# Patient Record
Sex: Male | Born: 1950
Health system: Southern US, Community
[De-identification: ages and names within clinical notes are randomized; demographics above are authoritative.]

## PROBLEM LIST (undated history)

## (undated) DIAGNOSIS — I10 Essential (primary) hypertension: Secondary | ICD-10-CM

## (undated) DIAGNOSIS — I2510 Atherosclerotic heart disease of native coronary artery without angina pectoris: Secondary | ICD-10-CM

## (undated) DIAGNOSIS — I729 Aneurysm of unspecified site: Secondary | ICD-10-CM

## (undated) DIAGNOSIS — N183 Chronic kidney disease, stage 3 unspecified: Secondary | ICD-10-CM

## (undated) DIAGNOSIS — E039 Hypothyroidism, unspecified: Secondary | ICD-10-CM

## (undated) DIAGNOSIS — G629 Polyneuropathy, unspecified: Secondary | ICD-10-CM

## (undated) DIAGNOSIS — Z87442 Personal history of urinary calculi: Secondary | ICD-10-CM

## (undated) DIAGNOSIS — I213 ST elevation (STEMI) myocardial infarction of unspecified site: Secondary | ICD-10-CM

## (undated) DIAGNOSIS — M1990 Unspecified osteoarthritis, unspecified site: Secondary | ICD-10-CM

## (undated) DIAGNOSIS — E785 Hyperlipidemia, unspecified: Secondary | ICD-10-CM

## (undated) HISTORY — PX: CHOLECYSTECTOMY: SHX55

## (undated) HISTORY — PX: KNEE ARTHROSCOPY: SUR90

## (undated) HISTORY — DX: Chronic kidney disease, stage 3 (moderate): N18.3

## (undated) HISTORY — PX: EXTRACORPOREAL SHOCK WAVE LITHOTRIPSY: SHX1557

## (undated) HISTORY — DX: ST elevation (STEMI) myocardial infarction of unspecified site: I21.3

## (undated) HISTORY — PX: OTHER SURGICAL HISTORY: SHX169

## (undated) HISTORY — DX: Chronic kidney disease, stage 3 unspecified: N18.30

## (undated) HISTORY — DX: Atherosclerotic heart disease of native coronary artery without angina pectoris: I25.10

## (undated) HISTORY — DX: Essential (primary) hypertension: I10

---

## 2001-02-22 ENCOUNTER — Ambulatory Visit (HOSPITAL_COMMUNITY): Admission: RE | Admit: 2001-02-22 | Discharge: 2001-02-22 | Payer: Self-pay | Admitting: Urology

## 2001-02-22 ENCOUNTER — Encounter: Payer: Self-pay | Admitting: Urology

## 2001-10-24 ENCOUNTER — Encounter: Payer: Self-pay | Admitting: Urology

## 2001-10-24 ENCOUNTER — Observation Stay (HOSPITAL_COMMUNITY): Admission: RE | Admit: 2001-10-24 | Discharge: 2001-10-25 | Payer: Self-pay | Admitting: Urology

## 2013-10-11 NOTE — H&P (Signed)
  NTS SOAP Note  Vital Signs:  Vitals as of: 5/00/9381: Systolic 829: Diastolic 81: Heart Rate 70: Temp 98.33F: Height 36ft 0in: Weight 186Lbs 0 Ounces: BMI 25.23  BMI : 25.23 kg/m2  Subjective: This 63 Years 77 Months old Male presents for screening TCS.  Had multple attempts at TCS and BE at another facility recently, but was never able to have the colon fully examined.  Now presents for me to do colonoscopy.  No family h/o colon carcinoma.  Had diverticulitis in 3/15.  Review of Symptoms:  Constitutional:  fatigue Head:unremarkable    Eyes:unremarkable   Nose/Mouth/Throat:unremarkable Cardiovascular:  unremarkable   Respiratory:unremarkable   Gastrointestinal:  unremarkable   Genitourinary:unremarkable     Musculoskeletal:unremarkable   Skin:unremarkable Hematolgic/Lymphatic:unremarkable     Allergic/Immunologic:unremarkable     Past Medical History:    Reviewed  Past Medical History  Surgical History: cholecystectomy, knee surgery, multiple attempted colonoscopy 4/15 Medical Problems: diverticulitis Allergies: codeine Medications: none   Social History:Reviewed  Social History  Preferred Language: English Race:  White Ethnicity: Not Hispanic / Latino Age: 63 Years 8 Months Marital Status:  M Alcohol: no Recreational drug(s): no   Smoking Status: Never smoker reviewed on 10/01/2013 Functional Status reviewed on 10/01/2013 ------------------------------------------------ Bathing: Normal Cooking: Normal Dressing: Normal Driving: Normal Eating: Normal Managing Meds: Normal Oral Care: Normal Shopping: Normal Toileting: Normal Transferring: Normal Walking: Normal Cognitive Status reviewed on 10/01/2013 ------------------------------------------------ Attention: Normal Decision Making: Normal Language: Normal Memory: Normal Motor: Normal Perception: Normal Problem Solving: Normal Visual and Spatial:  Normal   Family History:  Reviewed  Family Health History Mother, Living; Cancer unspecified;  Father, Living; Healthy;     Objective Information: General:  Well appearing, well nourished in no distress. Heart:  RRR, no murmur or gallop.  Normal S1, S2.  No S3, S4.  Lungs:    CTA bilaterally, no wheezes, rhonchi, rales.  Breathing unlabored. Abdomen:Soft, NT/ND, no HSM, no masses.  Assessment:Need for screening TCS  Diagnoses: V76.51 Screening for malignant neoplasm of colon (Encounter for screening for malignant neoplasm of colon)  Procedures: 93716 - OFFICE OUTPATIENT NEW 20 MINUTES    Plan:  Schedule for TCS in OR on 11/08/13.   Patient Education:Alternative treatments to surgery were discussed with patient (and family).  Risks and benefits  of procedure including bleeding and perforation were fully explained to the patient (and family) who gave informed consent. Patient/family questions were addressed.  Follow-up:Pending Surgery

## 2013-11-04 ENCOUNTER — Encounter (HOSPITAL_COMMUNITY): Payer: Self-pay | Admitting: Pharmacy Technician

## 2013-11-04 ENCOUNTER — Encounter (HOSPITAL_COMMUNITY): Payer: Self-pay

## 2013-11-04 ENCOUNTER — Encounter (HOSPITAL_COMMUNITY)
Admission: RE | Admit: 2013-11-04 | Discharge: 2013-11-04 | Disposition: A | Discharge status: Home / Self Care | Payer: BC Managed Care – PPO | Source: Ambulatory Visit | Attending: General Surgery | Admitting: General Surgery

## 2013-11-04 ENCOUNTER — Other Ambulatory Visit: Payer: Self-pay

## 2013-11-04 DIAGNOSIS — Z01812 Encounter for preprocedural laboratory examination: Secondary | ICD-10-CM | POA: Insufficient documentation

## 2013-11-04 DIAGNOSIS — Z01810 Encounter for preprocedural cardiovascular examination: Secondary | ICD-10-CM | POA: Insufficient documentation

## 2013-11-04 HISTORY — DX: Unspecified osteoarthritis, unspecified site: M19.90

## 2013-11-04 LAB — BASIC METABOLIC PANEL
BUN: 14 mg/dL (ref 6–23)
CHLORIDE: 104 meq/L (ref 96–112)
CO2: 26 meq/L (ref 19–32)
CREATININE: 1.47 mg/dL — AB (ref 0.50–1.35)
Calcium: 9.7 mg/dL (ref 8.4–10.5)
GFR calc non Af Amer: 49 mL/min — ABNORMAL LOW (ref >90)
GFR, EST AFRICAN AMERICAN: 57 mL/min — AB (ref >90)
Glucose, Bld: 96 mg/dL (ref 70–99)
POTASSIUM: 5.1 meq/L (ref 3.7–5.3)
Sodium: 143 mEq/L (ref 137–147)

## 2013-11-04 LAB — CBC WITH DIFFERENTIAL/PLATELET
Basophils Absolute: 0 10*3/uL (ref 0.0–0.1)
Basophils Relative: 0 % (ref 0–1)
EOS PCT: 0 % (ref 0–5)
Eosinophils Absolute: 0 10*3/uL (ref 0.0–0.7)
HCT: 40.7 % (ref 39.0–52.0)
HEMOGLOBIN: 13.9 g/dL (ref 13.0–17.0)
LYMPHS PCT: 25 % (ref 12–46)
Lymphs Abs: 2.5 10*3/uL (ref 0.7–4.0)
MCH: 29.6 pg (ref 26.0–34.0)
MCHC: 34.2 g/dL (ref 30.0–36.0)
MCV: 86.6 fL (ref 78.0–100.0)
MONOS PCT: 5 % (ref 3–12)
Monocytes Absolute: 0.5 10*3/uL (ref 0.1–1.0)
NEUTROS ABS: 6.8 10*3/uL (ref 1.7–7.7)
Neutrophils Relative %: 70 % (ref 43–77)
Platelets: 263 10*3/uL (ref 150–400)
RBC: 4.7 MIL/uL (ref 4.22–5.81)
RDW: 14.1 % (ref 11.5–15.5)
WBC: 9.9 10*3/uL (ref 4.0–10.5)

## 2013-11-04 NOTE — Patient Instructions (Signed)
Charles Brooks  11/04/2013   Your procedure is scheduled on:   11/08/2013  Report to [facility] at  67  AM.  Call this number if you have problems the morning of surgery: [phone]   Remember:   Do not eat food or drink liquids after midnight.   Take these medicines the morning of surgery with A SIP OF WATER: none   Do not wear jewelry, make-up or nail polish.  Do not wear lotions, powders, or perfumes.   Do not shave 48 hours prior to surgery. Men may shave face and neck.  Do not bring valuables to the hospital.  [facility] is not responsible for any belongings or valuables.               Contacts, dentures or bridgework may not be worn into surgery.  Leave suitcase in the car. After surgery it may be brought to your room.  For patients admitted to the hospital, discharge time is determined by your treatment team.               Patients discharged the day of surgery will not be allowed to drive home.  Name and phone number of your driver: family  Special Instructions: N/A   Please read over the following fact sheets that you were given: Pain Booklet, Coughing and Deep Breathing, Surgical Site Infection Prevention, Anesthesia Post-op Instructions and Care and Recovery After Surgery Colonoscopy A colonoscopy is an exam to look at the entire large intestine (colon). This exam can help find problems such as tumors, polyps, inflammation, and areas of bleeding. The exam takes about 1 hour.  LET [facility] CARE PROVIDER KNOW ABOUT:   Any allergies you have.  All medicines you are taking, including vitamins, herbs, eye drops, creams, and over-the-counter medicines.  Previous problems you or members of your family have had with the use of anesthetics.  Any blood disorders you have.  Previous surgeries you have had.  Medical conditions you have. RISKS AND COMPLICATIONS  Generally, this is a safe procedure. However, as with any procedure, complications can occur. Possible  complications include:  Bleeding.  Tearing or rupture of the colon wall.  Reaction to medicines given during the exam.  Infection (rare). BEFORE THE PROCEDURE   Ask your health care provider about changing or stopping your regular medicines.  You may be prescribed an oral bowel prep. This involves drinking a large amount of medicated liquid, starting the day before your procedure. The liquid will cause you to have multiple loose stools until your stool is almost clear or light green. This cleans out your colon in preparation for the procedure.  Do not eat or drink anything else once you have started the bowel prep, unless your health care provider tells you it is safe to do so.  Arrange for someone to drive you home after the procedure. PROCEDURE   You will be given medicine to help you relax (sedative).  You will lie on your side with your knees bent.  A long, flexible tube with a light and camera on the end (colonoscope) will be inserted through the rectum and into the colon. The camera sends video back to a computer screen as it moves through the colon. The colonoscope also releases carbon dioxide gas to inflate the colon. This helps your health care provider see the area better.  During the exam, your health care provider may take a small tissue sample (biopsy) to be  examined under a microscope if any abnormalities are found.  The exam is finished when the entire colon has been viewed. AFTER THE PROCEDURE   Do not drive for 24 hours after the exam.  You may have a small amount of blood in your stool.  You may pass moderate amounts of gas and have mild abdominal cramping or bloating. This is caused by the gas used to inflate your colon during the exam.  Ask when your test results will be ready and how you will get your results. Make sure you get your test results. Document Released: 06/03/2000 Document Revised: 03/27/2013 Document Reviewed: 02/11/2013 [facility] Patient  Information 2014 [place]. PATIENT INSTRUCTIONS POST-ANESTHESIA  IMMEDIATELY FOLLOWING SURGERY:  Do not drive or operate machinery for the first twenty four hours after surgery.  Do not make any important decisions for twenty four hours after surgery or while taking narcotic pain medications or sedatives.  If you develop intractable nausea and vomiting or a severe headache please notify your doctor immediately.  FOLLOW-UP:  Please make an appointment with your surgeon as instructed. You do not need to follow up with anesthesia unless specifically instructed to do so.  WOUND CARE INSTRUCTIONS (if applicable):  Keep a dry clean dressing on the anesthesia/puncture wound site if there is drainage.  Once the wound has quit draining you may leave it open to air.  Generally you should leave the bandage intact for twenty four hours unless there is drainage.  If the epidural site drains for more than 36-48 hours please call the anesthesia department.  QUESTIONS?:  Please feel free to call your physician or the hospital operator if you have any questions, and they will be happy to assist you.

## 2013-11-08 ENCOUNTER — Encounter (HOSPITAL_COMMUNITY)
Admission: RE | Disposition: A | Discharge status: Home / Self Care | Payer: Self-pay | Source: Ambulatory Visit | Attending: General Surgery

## 2013-11-08 ENCOUNTER — Encounter (HOSPITAL_COMMUNITY): Payer: Self-pay | Admitting: *Deleted

## 2013-11-08 ENCOUNTER — Encounter (HOSPITAL_COMMUNITY): Payer: BC Managed Care – PPO | Admitting: Anesthesiology

## 2013-11-08 ENCOUNTER — Ambulatory Visit (HOSPITAL_COMMUNITY)
Admission: RE | Admit: 2013-11-08 | Discharge: 2013-11-08 | Disposition: A | Discharge status: Home / Self Care | Payer: BC Managed Care – PPO | Source: Ambulatory Visit | Attending: General Surgery | Admitting: General Surgery

## 2013-11-08 ENCOUNTER — Ambulatory Visit (HOSPITAL_COMMUNITY): Payer: BC Managed Care – PPO | Admitting: Anesthesiology

## 2013-11-08 DIAGNOSIS — K5730 Diverticulosis of large intestine without perforation or abscess without bleeding: Secondary | ICD-10-CM | POA: Insufficient documentation

## 2013-11-08 DIAGNOSIS — Z87891 Personal history of nicotine dependence: Secondary | ICD-10-CM | POA: Insufficient documentation

## 2013-11-08 DIAGNOSIS — D126 Benign neoplasm of colon, unspecified: Secondary | ICD-10-CM | POA: Insufficient documentation

## 2013-11-08 DIAGNOSIS — Z1211 Encounter for screening for malignant neoplasm of colon: Secondary | ICD-10-CM | POA: Insufficient documentation

## 2013-11-08 HISTORY — PX: COLONOSCOPY WITH PROPOFOL: SHX5780

## 2013-11-08 HISTORY — PX: POLYPECTOMY: SHX5525

## 2013-11-08 SURGERY — COLONOSCOPY WITH PROPOFOL
Anesthesia: Monitor Anesthesia Care | Site: Rectum

## 2013-11-08 MED ORDER — PROPOFOL 10 MG/ML IV BOLUS
INTRAVENOUS | Status: AC
  Filled 2013-11-08: qty 20

## 2013-11-08 MED ORDER — FENTANYL CITRATE 0.05 MG/ML IJ SOLN
INTRAMUSCULAR | Status: AC
  Filled 2013-11-08: qty 2

## 2013-11-08 MED ORDER — LIDOCAINE HCL (CARDIAC) 10 MG/ML IV SOLN
INTRAVENOUS | Status: DC | PRN
  Administered 2013-11-08: 50 mg via INTRAVENOUS

## 2013-11-08 MED ORDER — FENTANYL CITRATE 0.05 MG/ML IJ SOLN: 25.0000 ug | INTRAMUSCULAR | Status: DC | PRN

## 2013-11-08 MED ORDER — LACTATED RINGERS IV SOLN
INTRAVENOUS | Status: DC
  Administered 2013-11-08: 07:00:00 via INTRAVENOUS

## 2013-11-08 MED ORDER — MIDAZOLAM HCL 5 MG/5ML IJ SOLN
INTRAMUSCULAR | Status: DC | PRN
  Administered 2013-11-08 (×2): 1 mg via INTRAVENOUS

## 2013-11-08 MED ORDER — LACTATED RINGERS IV SOLN
INTRAVENOUS | Status: DC | PRN
  Administered 2013-11-08: 07:00:00 via INTRAVENOUS

## 2013-11-08 MED ORDER — SUCCINYLCHOLINE CHLORIDE 20 MG/ML IJ SOLN
INTRAMUSCULAR | Status: AC
  Filled 2013-11-08: qty 1

## 2013-11-08 MED ORDER — LIDOCAINE HCL (PF) 1 % IJ SOLN
INTRAMUSCULAR | Status: AC
  Filled 2013-11-08: qty 5

## 2013-11-08 MED ORDER — FENTANYL CITRATE 0.05 MG/ML IJ SOLN
INTRAMUSCULAR | Status: DC | PRN
  Administered 2013-11-08 (×2): 25 ug via INTRAVENOUS
  Administered 2013-11-08: 50 ug via INTRAVENOUS

## 2013-11-08 MED ORDER — MIDAZOLAM HCL 2 MG/2ML IJ SOLN
1.0000 mg | INTRAMUSCULAR | Status: DC | PRN
  Administered 2013-11-08: 2 mg via INTRAVENOUS
  Filled 2013-11-08: qty 2

## 2013-11-08 MED ORDER — STERILE WATER FOR IRRIGATION IR SOLN
Status: DC | PRN
  Administered 2013-11-08: 07:00:00

## 2013-11-08 MED ORDER — MIDAZOLAM HCL 2 MG/2ML IJ SOLN
INTRAMUSCULAR | Status: AC
  Filled 2013-11-08: qty 2

## 2013-11-08 MED ORDER — PROPOFOL INFUSION 10 MG/ML OPTIME
INTRAVENOUS | Status: DC | PRN
  Administered 2013-11-08: 75 ug/kg/min via INTRAVENOUS

## 2013-11-08 MED ORDER — ONDANSETRON HCL 4 MG/2ML IJ SOLN: 4.0000 mg | Freq: Once | INTRAMUSCULAR | Status: DC | PRN

## 2013-11-08 MED ORDER — GLYCOPYRROLATE 0.2 MG/ML IJ SOLN
0.2000 mg | Freq: Once | INTRAMUSCULAR | Status: AC
  Administered 2013-11-08: 0.2 mg via INTRAVENOUS
  Filled 2013-11-08: qty 1

## 2013-11-08 MED ORDER — FENTANYL CITRATE 0.05 MG/ML IJ SOLN
25.0000 ug | INTRAMUSCULAR | Status: AC
  Administered 2013-11-08: 25 ug via INTRAVENOUS
  Filled 2013-11-08: qty 2

## 2013-11-08 MED ORDER — ONDANSETRON HCL 4 MG/2ML IJ SOLN
4.0000 mg | Freq: Once | INTRAMUSCULAR | Status: AC
  Administered 2013-11-08: 4 mg via INTRAVENOUS
  Filled 2013-11-08: qty 2

## 2013-11-08 SURGICAL SUPPLY — 13 items
ELECT REM PT RETURN 9FT ADLT (ELECTROSURGICAL) ×3
ELECTRODE REM PT RTRN 9FT ADLT (ELECTROSURGICAL) IMPLANT
FLOOR PAD 36X40 (MISCELLANEOUS) ×3
FORMALIN 10 PREFIL 20ML (MISCELLANEOUS) ×2 IMPLANT
KIT CLEAN ENDO COMPLIANCE (KITS) ×2 IMPLANT
LUBRICANT JELLY 4.5OZ STERILE (MISCELLANEOUS) ×2 IMPLANT
MANIFOLD NEPTUNE II (INSTRUMENTS) ×2 IMPLANT
PAD FLOOR 36X40 (MISCELLANEOUS) IMPLANT
SNARE SHORT THROW 13M SML OVAL (MISCELLANEOUS) ×2 IMPLANT
TRAP SPECIMEN MUCOUS 40CC (MISCELLANEOUS) ×2 IMPLANT
TUBING ENDO SMARTCAP PENTAX (MISCELLANEOUS) ×2 IMPLANT
TUBING IRRIGATION ENDOGATOR (MISCELLANEOUS) ×2 IMPLANT
WATER STERILE IRR 1000ML POUR (IV SOLUTION) ×2 IMPLANT

## 2013-11-08 NOTE — Discharge Instructions (Signed)
Colonoscopy, Care After °Refer to this sheet in the next few weeks. These instructions provide you with information on caring for yourself after your procedure. Your health care provider may also give you more specific instructions. Your treatment has been planned according to current medical practices, but problems sometimes occur. Call your health care provider if you have any problems or questions after your procedure. °WHAT TO EXPECT AFTER THE PROCEDURE  °After your procedure, it is typical to have the following: °· A small amount of blood in your stool. °· Moderate amounts of gas and mild abdominal cramping or bloating. ° ° °HOME CARE INSTRUCTIONS °· Do not drive, operate machinery, or sign important documents for 24 hours. °· You may shower and resume your regular physical activities, but move at a slower pace for the first 24 hours. °· Take frequent rest periods for the first 24 hours. °· Walk around or put a warm pack on your abdomen to help reduce abdominal cramping and bloating. °· Drink enough fluids to keep your urine clear or pale yellow. °· You may resume your normal diet as instructed by your health care provider. Avoid heavy or fried foods that are hard to digest. °· Avoid drinking alcohol for 24 hours or as instructed by your health care provider. °· Only take over-the-counter or prescription medicines as directed by your health care provider. °· If a tissue sample (biopsy) was taken during your procedure: °¨ Do not take aspirin or blood thinners for 7 days, or as instructed by your health care provider. °¨ Do not drink alcohol for 7 days, or as instructed by your health care provider. °¨ Eat soft foods for the first 24 hours. ° ° °SEEK MEDICAL CARE IF: °You have persistent spotting of blood in your stool 2-3 days after the procedure. ° ° °SEEK IMMEDIATE MEDICAL CARE IF: °· You have more than a small spotting of blood in your stool. °· You pass large blood clots in your stool. °· Your abdomen is  swollen (distended). °· You have nausea or vomiting. °· You have a fever. °· You have increasing abdominal pain that is not relieved with medicine. ° ° °

## 2013-11-08 NOTE — Anesthesia Preprocedure Evaluation (Addendum)
Anesthesia Evaluation  Patient identified by MRN, date of birth, ID band Patient awake    Reviewed: Allergy & Precautions, H&P , NPO status , Patient's Chart, lab work & pertinent test results  Airway Mallampati: III TM Distance: >3 FB   Mouth opening: Limited Mouth Opening  Dental  (+) Teeth Intact   Pulmonary neg pulmonary ROS, former smoker,  breath sounds clear to auscultation        Cardiovascular negative cardio ROS  Rhythm:Regular Rate:Normal     Neuro/Psych    GI/Hepatic negative GI ROS,   Endo/Other    Renal/GU      Musculoskeletal   Abdominal   Peds  Hematology   Anesthesia Other Findings   Reproductive/Obstetrics                          Anesthesia Physical Anesthesia Plan  ASA: I  Anesthesia Plan: MAC   Post-op Pain Management:    Induction: Intravenous  Airway Management Planned: Simple Face Mask  Additional Equipment:   Intra-op Plan:   Post-operative Plan:   Informed Consent: I have reviewed the patients History and Physical, chart, labs and discussed the procedure including the risks, benefits and alternatives for the proposed anesthesia with the patient or authorized representative who has indicated his/her understanding and acceptance.     Plan Discussed with:   Anesthesia Plan Comments:         Anesthesia Quick Evaluation

## 2013-11-08 NOTE — Transfer of Care (Signed)
Immediate Anesthesia Transfer of Care Note  Patient: Charles Brooks  Procedure(s) Performed: Procedure(s) with comments: COLONOSCOPY WITH PROPOFOL (N/A) - in cecum at 0741; cecal withdrawal time = 10 min POLYPECTOMY (N/A) - cecal polyp  Patient Location: PACU  Anesthesia Type:MAC  Level of Consciousness: sedated and patient cooperative  Airway & Oxygen Therapy: Patient Spontanous Breathing and Patient connected to nasal cannula oxygen  Post-op Assessment: Report given to PACU RN and Post -op Vital signs reviewed and stable  Post vital signs: Reviewed and stable  Complications: No apparent anesthesia complications

## 2013-11-08 NOTE — Anesthesia Procedure Notes (Signed)
Procedure Name: MAC Date/Time: 11/08/2013 7:28 AM Performed by: Andree Elk, Dewanna Hurston A Pre-anesthesia Checklist: Patient identified, Timeout performed, Emergency Drugs available, Suction available and Patient being monitored Patient Re-evaluated:Patient Re-evaluated prior to inductionOxygen Delivery Method: Simple face mask

## 2013-11-08 NOTE — Anesthesia Postprocedure Evaluation (Signed)
  Anesthesia Post-op Note  Patient: Charles Brooks  Procedure(s) Performed: Procedure(s) with comments: COLONOSCOPY WITH PROPOFOL (N/A) - in cecum at 0741; cecal withdrawal time = 10 min POLYPECTOMY (N/A) - cecal polyp  Patient Location: PACU  Anesthesia Type:MAC  Level of Consciousness: sedated and patient cooperative  Airway and Oxygen Therapy: Patient Spontanous Breathing and Patient connected to nasal cannula oxygen  Post-op Pain: none  Post-op Assessment: Post-op Vital signs reviewed, Patient's Cardiovascular Status Stable, Respiratory Function Stable, Patent Airway and No signs of Nausea or vomiting  Post-op Vital Signs: Reviewed and stable  Last Vitals:  Filed Vitals:   11/08/13 0802  BP: 87/51  Pulse: 82  Temp: 36.8 C  Resp: 14    Complications: No apparent anesthesia complications

## 2013-11-08 NOTE — Interval H&P Note (Signed)
History and Physical Interval Note:  11/08/2013 7:24 AM  Charles Brooks  has presented today for surgery, with the diagnosis of screening  The various methods of treatment have been discussed with the patient and family. After consideration of risks, benefits and other options for treatment, the patient has consented to  Procedure(s): COLONOSCOPY WITH PROPOFOL (N/A) as a surgical intervention .  The patient's history has been reviewed, patient examined, no change in status, stable for surgery.  I have reviewed the patient's chart and labs.  Questions were answered to the patient's satisfaction.     [name]

## 2013-11-08 NOTE — Op Note (Signed)
[facility] [address] [place], [address]   COLONOSCOPY PROCEDURE REPORT  PATIENT: Charles Brooks, Charles Brooks  MR#: [id number] BIRTHDATE: 08/03/1950 , 85  yrs. old GENDER: Male ENDOSCOPIST: Aviva Signs, MD REFERRED XB:JYNWGN, David PROCEDURE DATE:  11/08/2013 PROCEDURE:   Colonoscopy, screening ASA CLASS:   Class II INDICATIONS:Average risk patient for colon cancer. MEDICATIONS: Propofol (Diprivan)  DESCRIPTION OF PROCEDURE:   After the risks benefits and alternatives of the procedure were thoroughly explained, informed consent was obtained.  A digital rectal exam revealed no abnormalities of the rectum.   The     endoscope was introduced through the anus and advanced to the cecum, which was identified by both the appendix and ileocecal valve. No adverse events experienced.   The quality of the prep was adequate, using Trilyte The instrument was then slowly withdrawn as the colon was fully examined.      COLON FINDINGS: Mild diverticulosis was noted in the sigmoid colon. A small smooth sessile polyp was found at the cecum.  A polypectomy was performed using snare cautery.  The resection was complete and the polyp tissue was completely retrieved. Retroflexed views revealed no abnormalities. The time to cecum=4 minutes 0 seconds.  Withdrawal time=10 minutes 0 seconds.  The scope was withdrawn and the procedure completed. COMPLICATIONS: There were no complications.  ENDOSCOPIC IMPRESSION: 1.   Mild diverticulosis was noted in the sigmoid colon 2.   Small sessile polyp was found at the cecum; polypectomy was performed using snare cautery  RECOMMENDATIONS: 1.  Await pathology results 2.  Repeat Colonscopy in 3-5 years.   eSigned:  Aviva Signs, MD 11/08/2013 8:21 AM   cc:

## 2013-11-13 ENCOUNTER — Encounter (HOSPITAL_COMMUNITY): Payer: Self-pay | Admitting: General Surgery

## 2014-08-14 ENCOUNTER — Inpatient Hospital Stay (HOSPITAL_COMMUNITY)
Admission: EM | Admit: 2014-08-14 | Discharge: 2014-08-16 | DRG: 247 | Disposition: A | Discharge status: Home / Self Care | Payer: BLUE CROSS/BLUE SHIELD | Attending: Cardiovascular Disease | Admitting: Cardiovascular Disease

## 2014-08-14 ENCOUNTER — Encounter (HOSPITAL_COMMUNITY)
Admission: EM | Disposition: A | Discharge status: Home / Self Care | Payer: Self-pay | Source: Home / Self Care | Attending: Cardiovascular Disease

## 2014-08-14 ENCOUNTER — Encounter (HOSPITAL_COMMUNITY): Payer: Self-pay

## 2014-08-14 DIAGNOSIS — Z87891 Personal history of nicotine dependence: Secondary | ICD-10-CM | POA: Diagnosis not present

## 2014-08-14 DIAGNOSIS — E785 Hyperlipidemia, unspecified: Secondary | ICD-10-CM | POA: Diagnosis present

## 2014-08-14 DIAGNOSIS — E781 Pure hyperglyceridemia: Secondary | ICD-10-CM | POA: Diagnosis present

## 2014-08-14 DIAGNOSIS — I2510 Atherosclerotic heart disease of native coronary artery without angina pectoris: Secondary | ICD-10-CM | POA: Diagnosis present

## 2014-08-14 DIAGNOSIS — I214 Non-ST elevation (NSTEMI) myocardial infarction: Secondary | ICD-10-CM | POA: Diagnosis present

## 2014-08-14 DIAGNOSIS — I213 ST elevation (STEMI) myocardial infarction of unspecified site: Secondary | ICD-10-CM | POA: Diagnosis present

## 2014-08-14 DIAGNOSIS — I2111 ST elevation (STEMI) myocardial infarction involving right coronary artery: Secondary | ICD-10-CM | POA: Diagnosis not present

## 2014-08-14 DIAGNOSIS — R0989 Other specified symptoms and signs involving the circulatory and respiratory systems: Secondary | ICD-10-CM | POA: Diagnosis not present

## 2014-08-14 DIAGNOSIS — R079 Chest pain, unspecified: Secondary | ICD-10-CM

## 2014-08-14 DIAGNOSIS — I219 Acute myocardial infarction, unspecified: Secondary | ICD-10-CM | POA: Insufficient documentation

## 2014-08-14 HISTORY — PX: LEFT HEART CATHETERIZATION WITH CORONARY ANGIOGRAM: SHX5451

## 2014-08-14 LAB — TSH: TSH: 3.423 u[IU]/mL (ref 0.350–4.500)

## 2014-08-14 LAB — TROPONIN I
Troponin I: 0.98 ng/mL (ref <0.031)
Troponin I: 3.8 ng/mL (ref <0.031)
Troponin I: 3.82 ng/mL (ref <0.031)

## 2014-08-14 LAB — BASIC METABOLIC PANEL
Anion gap: 6 (ref 5–15)
Anion gap: 6 (ref 5–15)
BUN: 26 mg/dL — ABNORMAL HIGH (ref 6–23)
BUN: 22 mg/dL (ref 6–23)
CALCIUM: 8.6 mg/dL (ref 8.4–10.5)
CHLORIDE: 109 mmol/L (ref 96–112)
CO2: 22 mmol/L (ref 19–32)
CO2: 20 mmol/L (ref 19–32)
CREATININE: 1.61 mg/dL — AB (ref 0.50–1.35)
Calcium: 9.1 mg/dL (ref 8.4–10.5)
Chloride: 107 mmol/L (ref 96–112)
Creatinine, Ser: 1.91 mg/dL — ABNORMAL HIGH (ref 0.50–1.35)
GFR calc Af Amer: 41 mL/min — ABNORMAL LOW (ref >90)
GFR, EST AFRICAN AMERICAN: 51 mL/min — AB (ref >90)
GFR, EST NON AFRICAN AMERICAN: 35 mL/min — AB (ref >90)
GFR, EST NON AFRICAN AMERICAN: 44 mL/min — AB (ref >90)
GLUCOSE: 109 mg/dL — AB (ref 70–99)
Glucose, Bld: 144 mg/dL — ABNORMAL HIGH (ref 70–99)
Potassium: 3.8 mmol/L (ref 3.5–5.1)
Potassium: 4.8 mmol/L (ref 3.5–5.1)
Sodium: 137 mmol/L (ref 135–145)
Sodium: 133 mmol/L — ABNORMAL LOW (ref 135–145)

## 2014-08-14 LAB — LIPID PANEL
CHOL/HDL RATIO: 7.3 ratio
Cholesterol: 176 mg/dL (ref 0–200)
HDL: 24 mg/dL — AB (ref >39)
LDL Cholesterol: 90 mg/dL (ref 0–99)
Triglycerides: 311 mg/dL — ABNORMAL HIGH (ref <150)
VLDL: 62 mg/dL — AB (ref 0–40)

## 2014-08-14 LAB — CBC WITH DIFFERENTIAL/PLATELET
BASOS PCT: 1 % (ref 0–1)
Basophils Absolute: 0.1 10*3/uL (ref 0.0–0.1)
EOS PCT: 2 % (ref 0–5)
Eosinophils Absolute: 0.2 10*3/uL (ref 0.0–0.7)
HCT: 45.4 % (ref 39.0–52.0)
Hemoglobin: 16 g/dL (ref 13.0–17.0)
LYMPHS ABS: 5.2 10*3/uL — AB (ref 0.7–4.0)
Lymphocytes Relative: 47 % — ABNORMAL HIGH (ref 12–46)
MCH: 31.6 pg (ref 26.0–34.0)
MCHC: 35.2 g/dL (ref 30.0–36.0)
MCV: 89.5 fL (ref 78.0–100.0)
MONOS PCT: 8 % (ref 3–12)
Monocytes Absolute: 0.8 10*3/uL (ref 0.1–1.0)
NEUTROS PCT: 42 % — AB (ref 43–77)
Neutro Abs: 4.6 10*3/uL (ref 1.7–7.7)
PLATELETS: 225 10*3/uL (ref 150–400)
RBC: 5.07 MIL/uL (ref 4.22–5.81)
RDW: 13.2 % (ref 11.5–15.5)
WBC: 10.8 10*3/uL — AB (ref 4.0–10.5)

## 2014-08-14 LAB — BRAIN NATRIURETIC PEPTIDE: B Natriuretic Peptide: 30.2 pg/mL (ref 0.0–100.0)

## 2014-08-14 LAB — MRSA PCR SCREENING: MRSA by PCR: NEGATIVE

## 2014-08-14 LAB — PROTIME-INR
INR: 5.65 — AB (ref 0.00–1.49)
PROTHROMBIN TIME: 51.5 s — AB (ref 11.6–15.2)

## 2014-08-14 LAB — APTT: aPTT: 143 seconds — ABNORMAL HIGH (ref 24–37)

## 2014-08-14 LAB — POCT ACTIVATED CLOTTING TIME: Activated Clotting Time: 908 seconds

## 2014-08-14 SURGERY — LEFT HEART CATHETERIZATION WITH CORONARY ANGIOGRAM
Anesthesia: LOCAL

## 2014-08-14 MED ORDER — ASPIRIN 81 MG PO CHEW: 324.0000 mg | CHEWABLE_TABLET | ORAL | Status: DC

## 2014-08-14 MED ORDER — HEPARIN SODIUM (PORCINE) 5000 UNIT/ML IJ SOLN
INTRAMUSCULAR | Status: DC
Start: 2014-08-14 — End: 2014-08-14
  Filled 2014-08-14: qty 1

## 2014-08-14 MED ORDER — TICAGRELOR 90 MG PO TABS: 90.0000 mg | ORAL_TABLET | Freq: Two times a day (BID) | ORAL | Status: DC

## 2014-08-14 MED ORDER — NITROGLYCERIN IN D5W 200-5 MCG/ML-% IV SOLN: 0.0000 ug/min | INTRAVENOUS | Status: DC

## 2014-08-14 MED ORDER — ASPIRIN 81 MG PO CHEW
324.0000 mg | CHEWABLE_TABLET | Freq: Once | ORAL | Status: AC
  Administered 2014-08-14: 324 mg via ORAL

## 2014-08-14 MED ORDER — ASPIRIN EC 81 MG PO TBEC: 81.0000 mg | DELAYED_RELEASE_TABLET | Freq: Every day | ORAL | Status: DC

## 2014-08-14 MED ORDER — METOPROLOL TARTRATE 12.5 MG HALF TABLET
12.5000 mg | ORAL_TABLET | Freq: Two times a day (BID) | ORAL | Status: DC
  Filled 2014-08-14 (×2): qty 1

## 2014-08-14 MED ORDER — NITROGLYCERIN 0.4 MG SL SUBL
0.4000 mg | SUBLINGUAL_TABLET | SUBLINGUAL | Status: DC | PRN
  Administered 2014-08-14: 0.4 mg via SUBLINGUAL

## 2014-08-14 MED ORDER — SODIUM CHLORIDE 0.9 % IV SOLN: INTRAVENOUS | Status: DC

## 2014-08-14 MED ORDER — NITROGLYCERIN 0.4 MG SL SUBL: 0.4000 mg | SUBLINGUAL_TABLET | SUBLINGUAL | Status: DC | PRN

## 2014-08-14 MED ORDER — ATROPINE SULFATE 0.1 MG/ML IJ SOLN
INTRAMUSCULAR | Status: AC
  Filled 2014-08-14: qty 10

## 2014-08-14 MED ORDER — ATORVASTATIN CALCIUM 80 MG PO TABS
80.0000 mg | ORAL_TABLET | Freq: Every day | ORAL | Status: DC
  Administered 2014-08-14 – 2014-08-15 (×2): 80 mg via ORAL
  Filled 2014-08-14 (×4): qty 1

## 2014-08-14 MED ORDER — MORPHINE SULFATE 4 MG/ML IJ SOLN
4.0000 mg | Freq: Once | INTRAMUSCULAR | Status: AC
  Administered 2014-08-14: 4 mg via INTRAVENOUS
  Filled 2014-08-14: qty 1

## 2014-08-14 MED ORDER — MORPHINE SULFATE 4 MG/ML IJ SOLN
INTRAMUSCULAR | Status: AC
  Filled 2014-08-14: qty 1

## 2014-08-14 MED ORDER — METOPROLOL TARTRATE 12.5 MG HALF TABLET
12.5000 mg | ORAL_TABLET | Freq: Two times a day (BID) | ORAL | Status: DC
  Administered 2014-08-14 – 2014-08-16 (×3): 12.5 mg via ORAL
  Filled 2014-08-14 (×7): qty 1

## 2014-08-14 MED ORDER — NITROGLYCERIN 1 MG/10 ML FOR IR/CATH LAB
INTRA_ARTERIAL | Status: AC
  Filled 2014-08-14: qty 10

## 2014-08-14 MED ORDER — BIVALIRUDIN 250 MG IV SOLR
INTRAVENOUS | Status: AC
  Filled 2014-08-14: qty 250

## 2014-08-14 MED ORDER — SODIUM CHLORIDE 0.9 % IV SOLN: 0.2500 mg/kg/h | INTRAVENOUS | Status: DC

## 2014-08-14 MED ORDER — HEPARIN (PORCINE) IN NACL 2-0.9 UNIT/ML-% IJ SOLN
INTRAMUSCULAR | Status: AC
  Filled 2014-08-14: qty 1000

## 2014-08-14 MED ORDER — SODIUM CHLORIDE 0.9 % IV SOLN
INTRAVENOUS | Status: AC
  Administered 2014-08-14: 12:00:00 via INTRAVENOUS

## 2014-08-14 MED ORDER — ATORVASTATIN CALCIUM 80 MG PO TABS
80.0000 mg | ORAL_TABLET | Freq: Every day | ORAL | Status: DC
  Filled 2014-08-14: qty 1

## 2014-08-14 MED ORDER — MIDAZOLAM HCL 2 MG/2ML IJ SOLN
INTRAMUSCULAR | Status: AC
  Filled 2014-08-14: qty 2

## 2014-08-14 MED ORDER — TICAGRELOR 90 MG PO TABS
ORAL_TABLET | ORAL | Status: AC
  Filled 2014-08-14: qty 2

## 2014-08-14 MED ORDER — MORPHINE SULFATE 4 MG/ML IJ SOLN
4.0000 mg | Freq: Once | INTRAMUSCULAR | Status: AC
  Administered 2014-08-14: 4 mg via INTRAVENOUS

## 2014-08-14 MED ORDER — ONDANSETRON HCL 4 MG/2ML IJ SOLN: 4.0000 mg | Freq: Four times a day (QID) | INTRAMUSCULAR | Status: DC | PRN

## 2014-08-14 MED ORDER — HEPARIN SODIUM (PORCINE) 5000 UNIT/ML IJ SOLN
60.0000 [IU]/kg | Freq: Once | INTRAMUSCULAR | Status: AC
  Administered 2014-08-14: 5100 [IU] via INTRAVENOUS
  Filled 2014-08-14: qty 2

## 2014-08-14 MED ORDER — ASPIRIN 81 MG PO CHEW
CHEWABLE_TABLET | ORAL | Status: AC
  Filled 2014-08-14: qty 4

## 2014-08-14 MED ORDER — NITROGLYCERIN 0.4 MG SL SUBL
0.4000 mg | SUBLINGUAL_TABLET | Freq: Once | SUBLINGUAL | Status: AC
  Administered 2014-08-14: 0.4 mg via SUBLINGUAL

## 2014-08-14 MED ORDER — ACETAMINOPHEN 325 MG PO TABS: 650.0000 mg | ORAL_TABLET | ORAL | Status: DC | PRN

## 2014-08-14 MED ORDER — TICAGRELOR 90 MG PO TABS
90.0000 mg | ORAL_TABLET | Freq: Two times a day (BID) | ORAL | Status: DC
  Filled 2014-08-14 (×2): qty 1

## 2014-08-14 MED ORDER — METOPROLOL TARTRATE 12.5 MG HALF TABLET
12.5000 mg | ORAL_TABLET | Freq: Two times a day (BID) | ORAL | Status: DC
  Filled 2014-08-14: qty 1

## 2014-08-14 MED ORDER — NITROGLYCERIN 0.4 MG SL SUBL
SUBLINGUAL_TABLET | SUBLINGUAL | Status: AC
  Filled 2014-08-14: qty 3

## 2014-08-14 MED ORDER — ASPIRIN EC 81 MG PO TBEC
81.0000 mg | DELAYED_RELEASE_TABLET | Freq: Every day | ORAL | Status: DC
  Administered 2014-08-15 – 2014-08-16 (×2): 81 mg via ORAL
  Filled 2014-08-14 (×3): qty 1

## 2014-08-14 MED ORDER — FENTANYL CITRATE 0.05 MG/ML IJ SOLN
INTRAMUSCULAR | Status: AC
  Filled 2014-08-14: qty 2

## 2014-08-14 MED ORDER — SODIUM CHLORIDE 0.9 % IR SOLN: Freq: Once | Status: DC

## 2014-08-14 MED ORDER — HEPARIN SODIUM (PORCINE) 5000 UNIT/ML IJ SOLN
5000.0000 [IU] | Freq: Three times a day (TID) | INTRAMUSCULAR | Status: DC
  Administered 2014-08-14 – 2014-08-15 (×4): 5000 [IU] via SUBCUTANEOUS
  Filled 2014-08-14 (×8): qty 1

## 2014-08-14 MED ORDER — NITROGLYCERIN IN D5W 200-5 MCG/ML-% IV SOLN
10.0000 ug/min | INTRAVENOUS | Status: DC
  Administered 2014-08-14: 10 ug/min via INTRAVENOUS
  Filled 2014-08-14: qty 250

## 2014-08-14 MED ORDER — ASPIRIN 300 MG RE SUPP: 300.0000 mg | RECTAL | Status: DC

## 2014-08-14 NOTE — Care Management Note (Signed)
    Page 1 of 1   08/14/2014     3:29:36 PM CARE MANAGEMENT NOTE 08/14/2014  Patient:  Charles Brooks, Charles Brooks   Account Number:  [id number]  Date Initiated:  08/14/2014  Documentation initiated by:  [facility]  Subjective/Objective Assessment:   Admitted with STEMI - emergent cath with placement of DES. placed on brilinta     Action/Plan:   Anticipated DC Date:  08/16/2014   Anticipated DC Plan:  [place]  CM consult      Choice offered to / List presented to:             Status of service:  In process, will continue to follow Medicare Important Message given?  NO (If response is "NO", the following Medicare IM given date fields will be blank) Date Medicare IM given:   Medicare IM given by:   Date Additional Medicare IM given:   Additional Medicare IM given by:    Discharge Disposition:    Per UR Regulation:  Reviewed for med. necessity/level of care/duration of stay  If discussed at [place] of Stay Meetings, dates discussed:    Comments:  ContactDaelyn, Brooks [phone]  [phone]   08-14-14 3:30pm Charles Brooks, Charles Brooks [phone] Patient lives at home with wife - independent prior.  Given Bilinta booklet with 30 day free card and discounted copays.  Encouraged to call the number to enroll in program for continued assistance and information. Answered questions, no further questions.

## 2014-08-14 NOTE — H&P (Signed)
Charles Brooks is an 64 y.o. male.    Chief Complaint: chest pain Primary Cardiologist: new - Dr. Shelva Majestic HPI: Charles Brooks is a 64 yo man with PMH of tobacco use quit 10 years ago who developed chest pain when driving to work this evening lasting 40+ minutes in duration. He characterized the pain as severe, substernal to left-sided, heavy sensation with radiation to the left arm. He had some associated diaphoresis. No known family history of sudden death or premature CAD. ECG at [facility] concerning with ST elevation anteriorly and lateral ST depression leading to Code STEMI activation with transfer to [facility]. He received large aspirin and heparin bolus and NTG gtt.    Past Medical History  Diagnosis Date  . Arthritis     Past Surgical History  Procedure Laterality Date  . Cholecystectomy    . Removal of kidney stones      open  . Knee arthroscopy Right   . Extracorporeal shock wave lithotripsy    . Colonoscopy with propofol N/A 11/08/2013    Procedure: COLONOSCOPY WITH PROPOFOL;  Surgeon: [name], MD;  Location: [facility];  Service: General;  Laterality: N/A;  in cecum at 0741; cecal withdrawal time = 10 min  . Polypectomy N/A 11/08/2013    Procedure: POLYPECTOMY;  Surgeon: [name], MD;  Location: [facility]S;  Service: General;  Laterality: N/A;  cecal polyp    No family history on file. Social History:  reports that he quit smoking about 10 years ago. His smoking use included Cigars. He does not have any smokeless tobacco history on file. He reports that he does not drink alcohol or use illicit drugs.  Allergies:  Allergies  Allergen Reactions  . Codeine Hives     (Not in a hospital admission)  Results for orders placed or performed during the hospital encounter of 08/14/14 (from the past 48 hour(s))  CBC with Differential     Status: Abnormal   Collection Time: 08/14/14  3:50 AM  Result Value Ref Range   WBC 10.8 (H) 4.0 - 10.5 K/uL   RBC 5.07 4.22  - 5.81 MIL/uL   Hemoglobin 16.0 13.0 - 17.0 g/dL   HCT 45.4 39.0 - 52.0 %   MCV 89.5 78.0 - 100.0 fL   MCH 31.6 26.0 - 34.0 pg   MCHC 35.2 30.0 - 36.0 g/dL   RDW 13.2 11.5 - 15.5 %   Platelets 225 150 - 400 K/uL   Neutrophils Relative % 42 (L) 43 - 77 %   Neutro Abs 4.6 1.7 - 7.7 K/uL   Lymphocytes Relative 47 (H) 12 - 46 %   Lymphs Abs 5.2 (H) 0.7 - 4.0 K/uL   Monocytes Relative 8 3 - 12 %   Monocytes Absolute 0.8 0.1 - 1.0 K/uL   Eosinophils Relative 2 0 - 5 %   Eosinophils Absolute 0.2 0.0 - 0.7 K/uL   Basophils Relative 1 0 - 1 %   Basophils Absolute 0.1 0.0 - 0.1 K/uL   No results found.  Review of Systems  Constitutional: Positive for malaise/fatigue and diaphoresis. Negative for fever and chills.  HENT: Negative for ear discharge.   Eyes: Negative for double vision and photophobia.  Respiratory: Positive for shortness of breath. Negative for cough.   Cardiovascular: Positive for chest pain. Negative for leg swelling.  Gastrointestinal: Positive for nausea. Negative for vomiting and abdominal pain.  Genitourinary: Negative for dysuria and hematuria.  Musculoskeletal: Negative for myalgias  and neck pain.  Neurological: Negative for tingling, tremors, sensory change and headaches.  Endo/Heme/Allergies: Negative for polydipsia. Does not bruise/bleed easily.  Psychiatric/Behavioral: Negative for depression, suicidal ideas, hallucinations and substance abuse.    Blood pressure 132/77, pulse 101, temperature 97.5 F (36.4 C), temperature source Oral, resp. rate 20, height 5' 11.5" (1.816 m), weight 85.276 kg (188 lb), SpO2 98 %. Physical Exam  Nursing note and vitals reviewed. Constitutional: He is oriented to person, place, and time. He appears well-developed and well-nourished. He appears distressed.  HENT:  Head: Normocephalic and atraumatic.  Nose: Nose normal.  Mouth/Throat: Oropharynx is clear and moist. No oropharyngeal exudate.  Eyes: Conjunctivae and EOM are  normal. Pupils are equal, round, and reactive to light. No scleral icterus.  Neck: Normal range of motion. Neck supple. No JVD present.  Cardiovascular: Normal rate, regular rhythm, normal heart sounds and intact distal pulses.  Exam reveals no gallop.   No murmur heard. Respiratory: Effort normal and breath sounds normal. No respiratory distress. He has no wheezes.  GI: Soft. Bowel sounds are normal. He exhibits no distension. There is no tenderness. There is no rebound.  Musculoskeletal: Normal range of motion. He exhibits no edema or tenderness.  Neurological: He is alert and oriented to person, place, and time. No cranial nerve deficit. Coordination normal.  Skin: Skin is warm. No rash noted. He is diaphoretic. No erythema.  Psychiatric: He has a normal mood and affect. His behavior is normal. Thought content normal.     Assessment/Plan  Charles Brooks is a 64 yo man with PMH of tobacco use quit 10 years ago who developed chest pain when driving to work this evening lasting 40+ minutes in duration. Differential diagnosis is musculoskeletal pain, esophageal spasm, GERD, aortic dissection, pericarditis, ACS/NSTEMI among other etiologies. I favor a diagnosis of dynamic ST changes consistent with STEMI. LHC currently being performed and demonstrates some branch marginal/diagonal disease and 100% proximal RCA. - trend cardiac markers - observation on telemetry, admit to CCU. - asa 81 mg, heparin gtt    - atorvastatin 80 mg qHS first dose now - hba1c, tsh, lipid panel, BNP - defer echo based on LV-gram with LHC unless change in symptoms    Dawnmarie Breon 08/14/2014, 4:35 AM

## 2014-08-14 NOTE — Progress Notes (Signed)
Met pt's wife and brother in the ED lobby and escorted them to 2nd floor cath lab waiting area. Pt was in cath lab. Served as liaison for family until Dr. Claiborne Billings completed cath procedure and explained to family pt's condition. During my visit w/family, pt's wife became tearful and admitted she was a little fearful. 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 (I shared this information w/cath lab staff.)  Pt's wife said pt is planning to retire in Aug. Presently he works 6 days p/wk on 12 hr shifts and said it is too much. Offered listening and emotional support to family while pt was in lab. Had prayer w/wife. She and twin brother expressed their appreciation for support during their wait. I notified staff in CCU that family was in CCU waiting area after pt was transferred to his room.  Ernest Haber Chaplain   08/14/14 0430  Clinical Encounter Type  Visited With Family

## 2014-08-14 NOTE — Progress Notes (Signed)
CRITICAL VALUE ALERT  Critical value received:  Trop 0.98, INR 5.6, PTT 143  Date of notification:  08/14/2014  Time of notification:  9672, [phone]  Critical value read back:Yes.    Nurse who received alert:  Delbert Phenix, RN and C. Rice, RN  Values expected, pt post emergnet cardiac catheterization.

## 2014-08-14 NOTE — Progress Notes (Signed)
Utilization review completed. Memorie Yokoyama, RN, BSN. 

## 2014-08-14 NOTE — Progress Notes (Signed)
Sheath Removal Note  Rt femoral arterial sheath in place, groin level "0". Pedal pulses 1+ prior to sheath pull. Sheath removed and manual pressure held x 80mins. VSS throughout pull, post pull groin level "0", and pulses 1+. Post instructions given, pressure drsg applied. Will continue to monitor closely.

## 2014-08-14 NOTE — ED Provider Notes (Addendum)
CSN: [id number]     Arrival date & time 08/14/14  0329 History   First MD Initiated Contact with Patient 08/14/14 [phone]     Chief Complaint  Patient presents with  . Chest Pain     (Consider location/radiation/quality/duration/timing/severity/associated sxs/prior Treatment) Patient is a 64 y.o. male presenting with chest pain. The history is provided by the patient.  Chest Pain He was driving to work about 40 minutes ago when he suddenly developed a severe pain in the left anterior chest with some radiation to the left arm. He describes it as a heart pain and rates it at 8/10. There is no associated dyspnea or nausea but there was associated diaphoresis. Nothing seemed to make it better nothing seemed to make it worse. His never had pain like this before. He denies history of tobacco abuse, hypertension, hyperlipidemia, diabetes and denies family history of coronary artery disease.  Past Medical History  Diagnosis Date  . Arthritis    Past Surgical History  Procedure Laterality Date  . Cholecystectomy    . Removal of kidney stones      open  . Knee arthroscopy Right   . Extracorporeal shock wave lithotripsy    . Colonoscopy with propofol N/A 11/08/2013    Procedure: COLONOSCOPY WITH PROPOFOL;  Surgeon: [name], MD;  Location: [facility];  Service: General;  Laterality: N/A;  in cecum at 0741; cecal withdrawal time = 10 min  . Polypectomy N/A 11/08/2013    Procedure: POLYPECTOMY;  Surgeon: [name], MD;  Location: [facility];  Service: General;  Laterality: N/A;  cecal polyp   No family history on file. History  Substance Use Topics  . Smoking status: Former Smoker -- 20 years    Types: Cigars    Quit date: 11/05/2003  . Smokeless tobacco: Not on file  . Alcohol Use: No    Review of Systems  Cardiovascular: Positive for chest pain.  All other systems reviewed and are negative.     Allergies  Codeine  Home Medications   Prior to Admission medications   Medication  Sig Start Date End Date Taking? Authorizing Provider  naproxen sodium (ALEVE) 220 MG tablet Take 220 mg by mouth 2 (two) times daily with a meal.    Historical Provider, MD   BP 132/77 mmHg  Pulse 101  Temp(Src) 97.5 F (36.4 C) (Oral)  Resp 20  Ht 5' 11.5" (1.816 m)  Wt 188 lb (85.276 kg)  BMI 25.86 kg/m2  SpO2 98% Physical Exam  Nursing note and vitals reviewed.  64 year old male, who appears uncomfortable, but his in no acute distress. Vital signs are significant for hypertension. Oxygen saturation is 100%, which is normal. Head is normocephalic and atraumatic. PERRLA, EOMI. Oropharynx is clear. Neck is nontender and supple without adenopathy or JVD. Back is nontender and there is no CVA tenderness. Lungs are clear without rales, wheezes, or rhonchi. Chest is nontender. Heart has regular rate and rhythm without murmur. Abdomen is soft, flat, nontender without masses or hepatosplenomegaly and peristalsis is normoactive. Extremities have no cyanosis or edema, full range of motion is present. Skin is warm and dry without rash. Neurologic: Mental status is normal, cranial nerves are intact, there are no motor or sensory deficits.  ED Course  Procedures (including critical care time) Labs Review Results for orders placed or performed during the hospital encounter of 78/46/96  Basic metabolic panel  Result Value Ref Range   Sodium 137 135 - 145 mmol/L  Potassium 3.8 3.5 - 5.1 mmol/L   Chloride 109 96 - 112 mmol/L   CO2 22 19 - 32 mmol/L   Glucose, Bld 144 (H) 70 - 99 mg/dL   BUN 26 (H) 6 - 23 mg/dL   Creatinine, Ser 1.91 (H) 0.50 - 1.35 mg/dL   Calcium 9.1 8.4 - 10.5 mg/dL   GFR calc non Af Amer 35 (L) >90 mL/min   GFR calc Af Amer 41 (L) >90 mL/min   Anion gap 6 5 - 15  Troponin I  Result Value Ref Range   Troponin I <0.03 <0.031 ng/mL  CBC with Differential  Result Value Ref Range   WBC 10.8 (H) 4.0 - 10.5 K/uL   RBC 5.07 4.22 - 5.81 MIL/uL   Hemoglobin 16.0 13.0 -  17.0 g/dL   HCT 45.4 39.0 - 52.0 %   MCV 89.5 78.0 - 100.0 fL   MCH 31.6 26.0 - 34.0 pg   MCHC 35.2 30.0 - 36.0 g/dL   RDW 13.2 11.5 - 15.5 %   Platelets 225 150 - 400 K/uL   Neutrophils Relative % 42 (L) 43 - 77 %   Neutro Abs 4.6 1.7 - 7.7 K/uL   Lymphocytes Relative 47 (H) 12 - 46 %   Lymphs Abs 5.2 (H) 0.7 - 4.0 K/uL   Monocytes Relative 8 3 - 12 %   Monocytes Absolute 0.8 0.1 - 1.0 K/uL   Eosinophils Relative 2 0 - 5 %   Eosinophils Absolute 0.2 0.0 - 0.7 K/uL   Basophils Relative 1 0 - 1 %   Basophils Absolute 0.1 0.0 - 0.1 K/uL    EKG Interpretation   Date/Time:  Thursday August 14 2014 03:46:37 EST Ventricular Rate:  93 PR Interval:  140 QRS Duration: 87 QT Interval:  347 QTC Calculation: 432 R Axis:   11 Text Interpretation:  Sinus rhythm Abnormal R-wave progression, early  transition Repol abnrm suggests ischemia, lateral leads ST elevation,  consider anterior injury No significant change since last tracing  Confirmed by [facility]  MD, Lenorris Karger ([id number]) on 08/14/2014 4:02:43 AM       EKG Interpretation   Date/Time:  Thursday August 14 2014 03:46:37 EST Ventricular Rate:  93 PR Interval:  140 QRS Duration: 87 QT Interval:  347 QTC Calculation: 432 R Axis:   11 Text Interpretation:  Sinus rhythm Abnormal R-wave progression, early  transition Repol abnrm suggests ischemia, lateral leads ST elevation,  consider anterior injury No significant change since last tracing  Confirmed by [facility]  MD, Lashawnna Lambrecht ([id number]) on 08/14/2014 4:02:43 AM      CRITICAL CARE Performed by: YFVCB,SWHQP Total critical care time: 50 minutes Critical care time was exclusive of separately billable procedures and treating other patients. Critical care was necessary to treat or prevent imminent or life-threatening deterioration. Critical care was time spent personally by me on the following activities: development of treatment plan with patient and/or surrogate as well as nursing, discussions  with consultants, evaluation of patient's response to treatment, examination of patient, obtaining history from patient or surrogate, ordering and performing treatments and interventions, ordering and review of laboratory studies, ordering and review of radiographic studies, pulse oximetry and re-evaluation of patient's condition.  MDM   Final diagnoses:    NSTEMI (non-ST elevated myocardial infarction)    Chest pain worrisome for cardiac etiology. ECG shows ST elevation in leads V1 and V3 but not the 2. This does not qualify for STEMI, but patient clinically does have cardiac pain. He  is given aspirin and nitroglycerin with slight relief of pain. Is given additional nitroglycerin with no further relief of pain. ECG was repeated and does not show any progression. I was concerned about atypical ECG with patient and will actually has STEMI. Case is discussed with Dr. Shelva Majestic who is on for STEMI call but was unable to pull the ECG up on his home computer. Case was then discussed with Dr. Jules Husbands who is the cardiology fellow at [facility] who fell at the ECG did not show definite STEMI, but if the clinical picture was appropriate, recommended activating code STEMI. Patient clearly shows clinical picture consistent with STEMI so code STEMI was activated. He is given heparin bolus. Case was, again, discussed with Dr. Shelva Majestic who I did request on nitroglycerin drip be initiated. He is also given morphine for pain.    Delora Fuel, MD 87/86/76 [id number]  Please note, official diagnosis is non-STEMI because ECG did not meet diagnostic criteria for STEMI.  Delora Fuel, MD 47/09/62 [id number]

## 2014-08-14 NOTE — CV Procedure (Addendum)
Charles Brooks is a 64 y.o. male    [id number]  [id number] LOCATION:  FACILITY: [place]  PHYSICIAN: Troy Sine, MD, [facility] 01/18/1951   DATE OF PROCEDURE:  08/14/2014    EMERGENT CARDIAC CATHETERIZATION/PERCUTANEOUS CORONARY INTERVENTION     HISTORY:    Charles Brooks is a 64 y.o. male who was transferred from [facility] emergency room with noncontiguous ST segment elevation anterior lateral ST segment depression leading to "STEMI activation and transfer to [facility].  Patient has a prior tobacco history but quit 10 years ago.  He developed new onset chest pain that was only partially responsive to nitrate therapy and was associated with diaphoresis and presented to [facility] for evaluation.   PROCEDURE:  Left heart catheterization: coronary angiography, left ventriculography, PCI to totally occluded RCA with PTCA/DES stent.  The patient was brought to the [facility] cardiac catherization laboratory in her from [facility] emergency room.  He was still having chest pain.  He had received heparin and was on nitroglycerin upon arrival. He  was premedicated with Versed 2 mg and fentanyl 25 g. His right groin was prepped and shaved in usual sterile fashion. Xylocaine 1% was used for local anesthesia. A 6 French sheath was inserted into the R femoral artery. Diagnostic catheterizatiion was done with 5 [place] FL4, and FR4 catheters.  With the demonstration of total proximal RCA occlusion in a nondominant RCA the decision was made to attempt percutaneous coronary intervention.  A FR4 guide was inserted.  Bivalirudin bolus plus infusion was administered.  The patient received Brilinta 180 mg orally.  An Asahi medium wire was able to cross the total occlusion and was advanced into a marginal like vessel.  Initial dilatation was done with an Emerge 2.012 mm balloon at the proximal RCA occlusion site.  Once flow was established. There was also total occlusion at the bifurcation  into the marginal like branch vessel, which was dilated as well.  There was an additional more distal 90% stenosis in a very small portion of this vessel, which was dilated at 4 and 5 atm.  There was evidence for probable  linear dissection in the proximal RCA prior to the bifurcation which appears to have extended into the very small RCA branch beyond the marginal like vessel.  A [employer] Synergy 2.2524 mm stent was inserted in the proximal RCA and extended into this marginal like vessel which had been occludedf.  This was dilated at 11 atm 2.  An [place] Emerge 2.2515 mm balloon was used for post stent dilatation within the stented segment with establishment of brisk TIMI-3 flow.  Left ventriculography was done with a pigtail catheter and 25 cc Omnipaque contrast.  The arterial sheath was sutured in place with plans to continue bivalirudin for several hours post procedure.  The patient tolerated procedure well.  He left the cardiac catheterization laboratory chest pain-free with stable hemodynamics.  HEMODYNAMICS:   Central Aorta: 110/62   Left Ventricle: 110/8  ANGIOGRAPHY:  Left main: Angiographically normal vessel which bifurcated into a large LAD and a large dominant left circumflex coronary artery.  LAD: LAD gave rise to 2 proximal diagonal vessels.  Immediately after the takeoff of this second diagonal vessel.  The LAD had 50-60% stenosis proximal to a septal perforating artery.  The remainder of the LAD was free of significant disease and extended to the LV apex.  Left circumflex: Circumflex was large, dominant vessel that had a diffuse 80% stenosis in  a small OM1 branch.  The vessel supplied several additional marginal branches and ended in a posterolateral large caliber vessel.   Right coronary artery: Nondominant vessel that had total proximal occlusion with TIMI 0 flow initially.  Following PCI to the RCA, the very proximal RCA was opened.  There was total occlusion of a  marginal like branch just beyond the bifurcation of a smaller distal RCA branch.  He said sites were dilated.  There also is a distal 90% marginal branch stenosis which underwent PTCA to the vessel was small caliber and the distal 90% stenosis was reduced to 40%.  Will RCA extending into this marginal vessel was dilated with the 2.2524 mm Synergy DES stent with entire region been reduced to 0% with brisk TIMI-3 flow.  Left ventriculography revealed preserved LV contractility.  Ejection fraction is 55%.  There was no evidence for mitral regurgitation.   IMPRESSION:  Non ST segment elevation myocardial infarction secondary to proximal RCA occlusion in a nondominant RCA.  Multivessel CAD with 50-60% LAD stenosis immediately after the takeoff of a proximal second diagonal branch; 80% stenosis diffusely in a small caliber OM1 branch of a dominant left circumflex artery artery; total proximal RCA occlusion.  Preserved LV function with an ejection fraction of 55%.  Successful PCI of the RCA with PTCA and DES stenting with a 2.2524 mm Synergy in the proximal RCA extending into the marginal branch with the 100% occlusion.  in the proximal RCA and immediately after the bifurcation, both being reduced to 0% and PTCA of the very distal 90% marginal branch stenosis to less than 40%.  RECOMMENDATION:  Patient will continue with dual antiplatelet therapy for minimum of a year.  Medical therapy for concomitant CAD will be initiated with beta blocker therapy, ACE inhibitor addition, and possible nitrates.  Aggressive statin therapy with target LDL less than 70.    Troy Sine, MD, [facility] 08/14/2014 6:49 AM

## 2014-08-15 DIAGNOSIS — E785 Hyperlipidemia, unspecified: Secondary | ICD-10-CM

## 2014-08-15 LAB — CBC
HEMATOCRIT: 37.5 % — AB (ref 39.0–52.0)
HEMOGLOBIN: 12.7 g/dL — AB (ref 13.0–17.0)
MCH: 30 pg (ref 26.0–34.0)
MCHC: 33.9 g/dL (ref 30.0–36.0)
MCV: 88.4 fL (ref 78.0–100.0)
Platelets: 193 10*3/uL (ref 150–400)
RBC: 4.24 MIL/uL (ref 4.22–5.81)
RDW: 13.5 % (ref 11.5–15.5)
WBC: 9.1 10*3/uL (ref 4.0–10.5)

## 2014-08-15 LAB — BASIC METABOLIC PANEL
ANION GAP: 5 (ref 5–15)
BUN: 20 mg/dL (ref 6–23)
CALCIUM: 8.4 mg/dL (ref 8.4–10.5)
CHLORIDE: 108 mmol/L (ref 96–112)
CO2: 22 mmol/L (ref 19–32)
Creatinine, Ser: 1.71 mg/dL — ABNORMAL HIGH (ref 0.50–1.35)
GFR calc Af Amer: 47 mL/min — ABNORMAL LOW (ref >90)
GFR calc non Af Amer: 41 mL/min — ABNORMAL LOW (ref >90)
Glucose, Bld: 110 mg/dL — ABNORMAL HIGH (ref 70–99)
POTASSIUM: 4.4 mmol/L (ref 3.5–5.1)
SODIUM: 135 mmol/L (ref 135–145)

## 2014-08-15 LAB — HEMOGLOBIN A1C
Hgb A1c MFr Bld: 6.1 % — ABNORMAL HIGH (ref 4.8–5.6)
Mean Plasma Glucose: 128 mg/dL

## 2014-08-15 MED ORDER — TICAGRELOR 90 MG PO TABS
90.0000 mg | ORAL_TABLET | Freq: Two times a day (BID) | ORAL | Status: DC
  Administered 2014-08-15 – 2014-08-16 (×3): 90 mg via ORAL
  Filled 2014-08-15 (×6): qty 1

## 2014-08-15 NOTE — Progress Notes (Signed)
CARDIAC REHAB PHASE I   PRE:  Rate/Rhythm: 74 SR with Pacs    BP: sitting 148/70    SaO2: 98 RA  MODE:  Ambulation: 350 ft   POST:  Rate/Rhythm: 88 SR    BP: sitting 146/76     SaO2: 99 RA  Tolerated well, no c/o. This was his third walk since MI. Ed completed with pt and wife. Voiced understanding. Apparently has considerable diet change but pt is willing. Interested in [facility] and will send referral to [place] although work might be an issue. Understands Brilinta and has book. Taylor, Carlos, ACSM 08/15/2014 12:26 PM

## 2014-08-15 NOTE — Progress Notes (Signed)
Progress Note  Subjective:    Feeling quite well this AM, no complaints.   Objective:   Temp:  [97.6 F (36.4 C)-98.4 F (36.9 C)] 98.1 F (36.7 C) (02/26 0400) Pulse Rate:  [46-85] 52 (02/26 0700) Resp:  [8-23] 11 (02/26 0700) BP: (98-151)/(51-92) 109/71 mmHg (02/26 0700) SpO2:  [94 %-99 %] 96 % (02/26 0700) Arterial Line BP: (125-142)/(64-75) 125/64 mmHg (02/25 1125) Weight:  [194 lb 7.1 oz (88.2 kg)] 194 lb 7.1 oz (88.2 kg) (02/26 0500) Last BM Date: 08/14/14  Filed Weights   08/14/14 0340 08/14/14 0651 08/15/14 0500  Weight: 188 lb (85.276 kg) 193 lb 12.6 oz (87.9 kg) 194 lb 7.1 oz (88.2 kg)    Intake/Output Summary (Last 24 hours) at 08/15/14 1638 Last data filed at 08/15/14 0400  Gross per 24 hour  Intake 3147.5 ml  Output   1600 ml  Net 1547.5 ml    Telemetry: NSR/sinus bradycardia  Physical Exam: General: White male, alert, cooperative, NAD. HEENT: PERRL, EOMI. Moist mucus membranes Neck: Full range of motion without pain, supple, no lymphadenopathy or carotid bruits Lungs: Clear to ascultation bilaterally, normal work of respiration, no wheezes, rales, rhonchi Heart: RRR, no murmurs, gallops, or rubs Abdomen: Soft, non-tender, non-distended, BS + Extremities: No cyanosis, clubbing, or edema Neurologic: Alert & oriented x3, cranial nerves II-XII intact, strength grossly intact, sensation intact to light touch   Lab Results:  Basic Metabolic Panel:  Recent Labs Lab 08/14/14 0350 08/14/14 0706 08/15/14 0313  NA 137 133* 135  K 3.8 4.8 4.4  CL 109 107 108  CO2 22 20 22   GLUCOSE 144* 109* 110*  BUN 26* 22 20  CREATININE 1.91* 1.61* 1.71*  CALCIUM 9.1 8.6 8.4    CBC:  Recent Labs Lab 08/14/14 0350 08/15/14 0313  WBC 10.8* 9.1  HGB 16.0 12.7*  HCT 45.4 37.5*  MCV 89.5 88.4  PLT 225 193    Cardiac Enzymes:  Recent Labs Lab 08/14/14 0706 08/14/14 1242 08/14/14 1950  TROPONINI 0.98* 3.80* 3.82*    Coagulation:  Recent  Labs Lab 08/14/14 0706  INR 5.65*    ECG: Sinus bradycardia, LVH, lateral TWI   Medications:   Scheduled Medications: . aspirin EC  81 mg Oral Daily  . atorvastatin  80 mg Oral q1800  . heparin  5,000 Units Subcutaneous 3 times per day  . metoprolol tartrate  12.5 mg Oral BID     Infusions: . nitroGLYCERIN Stopped (08/14/14 0705)     PRN Medications:  nitroGLYCERIN   Assessment and Plan:  64 y/o M w/ remote h/o tobacco abuse, admitted on 08/14/14 w/ NSTEMI.  NSTEMI: Patient presented to [facility] w/ acute onset chest pain. Transferred to [facility] ED, taken emergently to the cath lab on the early AM of 08/15/14 which revealed multivessel CAD but most significant for proximal RCA occlusion in a non-dominant RCA. Received DES to RCA w/ good result. Troponins peaked at ~3.82.  -Continue ASA + Brilinta 90 mg bid -Lipitor 80 qhs -Lopressor 12.5 mg bid -Transfer to telemetry unit today, likely discharge home tomorrow  AKI: Baseline Cr unknown, previous Cr from 10/2013 was 1.47. Hospital trend as follows:   Recent Labs Lab 08/14/14 0350 08/14/14 0706 08/15/14 0313  CREATININE 1.91* 1.61* 1.71*  May be new baseline vs pre-renal injury or related to cath procedure.  -Repeat BMP in AM -Further workup w/ PCP as outpatient  Mixed HLD: HDL 24, LDL 90, triglycerides 311.  -Statin as above  Natasha Bence, MD PGY-2 Internal Medicine Pager: [phone]  Patient seen and examined and history reviewed. Agree with above findings and plan. Feeling very well. No chest pain or SOB. Groin looks good. LV function was good by cath. On high dose statin. 4 and 8 beat runs of NSVT. On effective beta blocker dose now. Will transfer to telemetry. Anticipate DC tomorrow.  Shamond Skelton [place], [place] 08/15/2014 9:20 AM

## 2014-08-16 DIAGNOSIS — I214 Non-ST elevation (NSTEMI) myocardial infarction: Principal | ICD-10-CM

## 2014-08-16 DIAGNOSIS — R0989 Other specified symptoms and signs involving the circulatory and respiratory systems: Secondary | ICD-10-CM

## 2014-08-16 DIAGNOSIS — E785 Hyperlipidemia, unspecified: Secondary | ICD-10-CM | POA: Diagnosis present

## 2014-08-16 DIAGNOSIS — E781 Pure hyperglyceridemia: Secondary | ICD-10-CM | POA: Diagnosis present

## 2014-08-16 MED ORDER — METOPROLOL TARTRATE 25 MG PO TABS: 12.5000 mg | ORAL_TABLET | Freq: Two times a day (BID) | ORAL | Status: DC

## 2014-08-16 MED ORDER — NITROGLYCERIN 0.4 MG SL SUBL: 0.4000 mg | SUBLINGUAL_TABLET | SUBLINGUAL | Status: DC | PRN

## 2014-08-16 MED ORDER — ATORVASTATIN CALCIUM 80 MG PO TABS: 80.0000 mg | ORAL_TABLET | Freq: Every day | ORAL | Status: DC

## 2014-08-16 MED ORDER — TICAGRELOR 90 MG PO TABS: 90.0000 mg | ORAL_TABLET | Freq: Two times a day (BID) | ORAL | Status: DC

## 2014-08-16 MED ORDER — ASPIRIN 81 MG PO TBEC: 81.0000 mg | DELAYED_RELEASE_TABLET | Freq: Every day | ORAL | Status: DC

## 2014-08-16 NOTE — Progress Notes (Signed)
CARDIAC REHAB PHASE I   PRE:  Rate/Rhythm: 87 sinus rhythm  BP:  Supine:   Sitting: 120/60  Standing:    SaO2: 99% ra  MODE:  Ambulation: 550 ft   POST:  Rate/Rhythem: 79 sinus rhythm  BP:  Supine:   Sitting: 120/70  Standing:    SaO2: 100% ra  0820-835  Pt ambulated in hallway x1 assist without difficulty.  Asymptomatic.  Risk factor education reinforced including exercising at home and medication compliance.  Pt verbalized understanding.    [place], [place]

## 2014-08-16 NOTE — Progress Notes (Signed)
   Subjective: NO CP  NO SOB   Objective: Filed Vitals:   08/15/14 1404 08/15/14 2056 08/16/14 0524 08/16/14 0900  BP: 130/67 104/51 121/71 117/51  Pulse: 59 68 66 60  Temp: 97.7 F (36.5 C) 98.7 F (37.1 C) 98.1 F (36.7 C)   TempSrc: Oral Oral Oral   Resp: 19 18 18    Height:      Weight:   194 lb 3.6 oz (88.1 kg)   SpO2: 98% 97% 97%    Weight change: -3.5 oz (-0.1 kg)  Intake/Output Summary (Last 24 hours) at 08/16/14 1139 Last data filed at 08/16/14 0726  Gross per 24 hour  Intake    690 ml  Output    200 ml  Net    490 ml    General: Alert, awake, oriented x3, in no acute distress Neck:  JVP is normal Heart: Regular rate and rhythm, without murmurs, rubs, gallops.  Lungs: Clear to auscultation.  No rales or wheezes. Exemities:  No edema.   Neuro: Grossly intact, nonfocal.  Tele:  SR   Lab Results: No results found for this or any previous visit (from the past 24 hour(s)).  Studies/Results: No results found.  Medications: Reviewed   @PROBHOSP @  1. 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  Renal   3.   HL    Lipitor 80 mg for now    LOS: 2 days   Dorris Carnes 08/16/2014, 11:39 AM

## 2014-08-16 NOTE — Progress Notes (Signed)
Patient discharged to home with his wife. Discharge instructions and prescriptions given to the patient and his wife, both verbalized understanding of instructions given. Patient taken out to private vehicle via wheelchair. [place], RN

## 2014-08-16 NOTE — Discharge Instructions (Signed)
PLEASE REMEMBER TO BRING ALL OF YOUR MEDICATIONS TO EACH OF YOUR FOLLOW-UP OFFICE VISITS. ° °PLEASE ATTEND ALL SCHEDULED FOLLOW-UP APPOINTMENTS.  ° °Activity: Increase activity slowly as tolerated. You may shower, but no soaking baths (or swimming) for 1 week. No driving for 1 week. No lifting over 5 lbs for 2 weeks. No sexual activity for 1 week.  ° °You May Return to Work: in 3 weeks (if applicable) ° °Wound Care: You may wash cath site gently with soap and water. Keep cath site clean and dry. If you notice pain, swelling, bleeding or pus at your cath site, please call [phone]. ° ° ° °Cardiac Cath Site Care °Refer to this sheet in the next few weeks. These instructions provide you with information on caring for yourself after your procedure. Your caregiver may also give you more specific instructions. Your treatment has been planned according to current medical practices, but problems sometimes occur. Call your caregiver if you have any problems or questions after your procedure. °HOME CARE INSTRUCTIONS °· You may shower 24 hours after the procedure. Remove the bandage (dressing) and gently wash the site with plain soap and water. Gently pat the site dry.  °· Do not apply powder or lotion to the site.  °· Do not sit in a bathtub, swimming pool, or whirlpool for 5 to 7 days.  °· No bending, squatting, or lifting anything over 10 pounds (4.5 kg) as directed by your caregiver.  °· Inspect the site at least twice daily.  °· Do not drive home if you are discharged the same day of the procedure. Have someone else drive you.  °· You may drive 24 hours after the procedure unless otherwise instructed by your caregiver.  °What to expect: °· Any bruising will usually fade within 1 to 2 weeks.  °· Blood that collects in the tissue (hematoma) may be painful to the touch. It should usually decrease in size and tenderness within 1 to 2 weeks.  °SEEK IMMEDIATE MEDICAL CARE IF: °· You have unusual pain at the site or down the  affected limb.  °· You have redness, warmth, swelling, or pain at the site.  °· You have drainage (other than a small amount of blood on the dressing).  °· You have chills.  °· You have a fever or persistent symptoms for more than 72 hours.  °· You have a fever and your symptoms suddenly get worse.  °· Your leg becomes pale, cool, tingly, or numb.  °· You have heavy bleeding from the site. Hold pressure on the site.  °Document Released: 07/09/2010 Document Revised: 05/26/2011 Document Reviewed:  ° °

## 2014-08-16 NOTE — Discharge Summary (Signed)
CARDIOLOGY DISCHARGE SUMMARY   Patient ID: Charles Brooks MRN: [id number] DOB/AGE: 12/17/1950 64 y.o.  Admit date: 08/14/2014 Discharge date: 08/16/2014  PCP: No primary care provider on file. Primary Cardiologist: Follow-up in [facility]  Primary Discharge Diagnosis:  ST elevation (STEMI) myocardial infarction Secondary Discharge Diagnosis:    Former tobacco use   NSTEMI (non-ST elevated myocardial infarction)   Hypertriglyceridemia   Dyslipidemia - low HDL  Procedures: Left heart catheterization: coronary angiography, left ventriculography, PCI to totally occluded RCA with PTCA/DES stent  Hospital Course: Charles Brooks is a 64 y.o. male with no history of CAD. He had chest pain and went to the [facility] emergency room where his ECG was concerning for ST elevation. Code STEMI was activated and he was transferred emergently to [facility] and taken directly to the Cath Lab.  Cardiac catheterization results are below. He had a drug-eluting stent to an occluded RCA. Residual disease is to be treated medically. His EF was 55%.  He was started on high-dose statin and a beta blocker. His renal function was abnormal so he was not started on an ACE inhibitor. He was seen by cardiac rehabilitation and educated on heart-healthy lifestyle modifications, MI restrictions and exercise guidelines. It is hoped he will follow up with cardiac rehabilitation as an outpatient.  He made good progress and was transferred out of ICU. On 2/27, he was seen by Dr. Harrington Challenger and all data were reviewed. He is to continue the beta blocker. He was noted to have short runs of nonsustained VT but was asymptomatic. He requested follow-up in [place], [place] and this will be arranged.  No further inpatient workup was indicated and he is considered stable for discharge, to follow up as an outpatient.  Labs:   Lab Results  Component Value Date   WBC 9.1 08/15/2014   HGB 12.7* 08/15/2014   HCT 37.5* 08/15/2014   MCV 88.4 08/15/2014   PLT 193 08/15/2014     Recent Labs Lab 08/15/14 0313  NA 135  K 4.4  CL 108  CO2 22  BUN 20  CREATININE 1.71*  CALCIUM 8.4  GLUCOSE 110*    Recent Labs  08/14/14 0706 08/14/14 1242 08/14/14 1950  TROPONINI 0.98* 3.80* 3.82*   Lipid Panel     Component Value Date/Time   CHOL 176 08/14/2014 0730   TRIG 311* 08/14/2014 0730   HDL 24* 08/14/2014 0730   CHOLHDL 7.3 08/14/2014 0730   VLDL 62* 08/14/2014 0730   LDLCALC 90 08/14/2014 0730    B NATRIURETIC PEPTIDE  Date/Time Value Ref Range Status  08/14/2014 07:30 AM 30.2 0.0 - 100.0 pg/mL Final    Recent Labs  08/14/14 0706  INR 5.65*      Cardiac Cath: 08/14/2014 ANGIOGRAPHY:  Left main: Angiographically normal vessel which bifurcated into a large LAD and a large dominant left circumflex coronary artery.  LAD: LAD gave rise to 2 proximal diagonal vessels. Immediately after the takeoff of this second diagonal vessel. The LAD had 50-60% stenosis proximal to a septal perforating artery. The remainder of the LAD was free of significant disease and extended to the LV apex.  Left circumflex: Circumflex was large, dominant vessel that had a diffuse 80% stenosis in a small OM1 branch. The vessel supplied several additional marginal branches and ended in a posterolateral large caliber vessel.   Right coronary artery: Nondominant vessel that had total proximal occlusion with TIMI 0 flow initially.  Following PCI to the RCA, the  very proximal RCA was opened. There was total occlusion of a marginal like branch just beyond the bifurcation of a smaller distal RCA branch. He said sites were dilated. There also is a distal 90% marginal branch stenosis which underwent PTCA to the vessel was small caliber and the distal 90% stenosis was reduced to 40%. Will RCA extending into this marginal vessel was dilated with the 2.2524 mm Synergy DES stent with entire region been reduced to 0% with brisk TIMI-3  flow.  Left ventriculography revealed preserved LV contractility. Ejection fraction is 55%. There was no evidence for mitral regurgitation.   IMPRESSION:  Non ST segment elevation myocardial infarction secondary to proximal RCA occlusion in a nondominant RCA.  Multivessel CAD with 50-60% LAD stenosis immediately after the takeoff of a proximal second diagonal branch; 80% stenosis diffusely in a small caliber OM1 branch of a dominant left circumflex artery artery; total proximal RCA occlusion.  Preserved LV function with an ejection fraction of 55%.  Successful PCI of the RCA with PTCA and DES stenting with a 2.2524 mm Synergy in the proximal RCA extending into the marginal branch with the 100% occlusion. in the proximal RCA and immediately after the bifurcation, both being reduced to 0% and PTCA of the very distal 90% marginal branch stenosis to less than 40%.  RECOMMENDATION:  Patient will continue with dual antiplatelet therapy for minimum of a year. Medical therapy for concomitant CAD will be initiated with beta blocker therapy, ACE inhibitor addition, and possible nitrates. Aggressive statin therapy with target LDL less than 70.  EKG: Sinus rhythm, evolving MI changes   FOLLOW UP PLANS AND APPOINTMENTS Allergies  Allergen Reactions  . Codeine Hives     Medication List    TAKE these medications        ALEVE 220 MG tablet  Generic drug:  naproxen sodium  Take 220 mg by mouth 2 (two) times daily with a meal.     aspirin 81 MG EC tablet  Take 1 tablet (81 mg total) by mouth daily.     atorvastatin 80 MG tablet  Commonly known as:  LIPITOR  Take 1 tablet (80 mg total) by mouth daily at 6 PM.     metoprolol tartrate 25 MG tablet  Commonly known as:  LOPRESSOR  Take 0.5 tablets (12.5 mg total) by mouth 2 (two) times daily.     nitroGLYCERIN 0.4 MG SL tablet  Commonly known as:  NITROSTAT  Place 1 tablet (0.4 mg total) under the tongue every 5 (five) minutes x 3  doses as needed for chest pain.     ticagrelor 90 MG Tabs tablet  Commonly known as:  BRILINTA  Take 1 tablet (90 mg total) by mouth 2 (two) times daily.        Discharge Instructions    Amb Referral to Cardiac Rehabilitation    Complete by:  As directed      Diet - low sodium heart healthy    Complete by:  As directed      Increase activity slowly    Complete by:  As directed           Follow-up Information    Follow up with [facility].   Why:  The office will call   Contact information:   [address] [place] [address]       BRING ALL MEDICATIONS WITH YOU TO FOLLOW UP APPOINTMENTS  Time spent with patient to include physician time: 38 min  Signed: Rosaria Ferries, PA-C 08/16/2014, 1:40 PM Co-Sign MD

## 2014-08-18 ENCOUNTER — Telehealth: Payer: Self-pay | Admitting: Cardiology

## 2014-08-18 NOTE — Telephone Encounter (Signed)
    Pt discharged after PCI/NSTEMI a TCM

## 2014-08-18 NOTE — Telephone Encounter (Signed)
Patient was contacted regarding discharge from [facility] on August 16, 2014.  Patient does understand to follow up with Dr. Domenic Polite on Friday, August 22, 2014 at 10:20 am at [facility]. Patient does understand his discharge instructions. Patient does understand his medications and regimen. Patient does understand to bring all of his medications to this visit.  Spoke with wife and she said patient denied chest pain, dizziness or sob.

## 2014-08-21 ENCOUNTER — Encounter: Payer: Self-pay | Admitting: Physician Assistant

## 2014-08-22 ENCOUNTER — Ambulatory Visit (INDEPENDENT_AMBULATORY_CARE_PROVIDER_SITE_OTHER): Payer: BLUE CROSS/BLUE SHIELD | Admitting: Cardiology

## 2014-08-22 ENCOUNTER — Telehealth: Payer: Self-pay | Admitting: *Deleted

## 2014-08-22 ENCOUNTER — Encounter: Payer: Self-pay | Admitting: Cardiology

## 2014-08-22 VITALS — BP 118/80 | HR 68 | Ht 71.0 in | Wt 188.0 lb

## 2014-08-22 DIAGNOSIS — E781 Pure hyperglyceridemia: Secondary | ICD-10-CM

## 2014-08-22 DIAGNOSIS — I2510 Atherosclerotic heart disease of native coronary artery without angina pectoris: Secondary | ICD-10-CM | POA: Diagnosis not present

## 2014-08-22 DIAGNOSIS — Z5181 Encounter for therapeutic drug level monitoring: Secondary | ICD-10-CM

## 2014-08-22 DIAGNOSIS — N183 Chronic kidney disease, stage 3 unspecified: Secondary | ICD-10-CM

## 2014-08-22 NOTE — Progress Notes (Signed)
Cardiology Office Note  Date: 08/22/2014   ID: Charles Brooks, DOB [birth year], MRN [id number]  PCP: Deloria Lair, MD  Primary Cardiologist: Rozann Lesches, MD   Chief Complaint  Patient presents with  . Hospitalization Follow-up  . Coronary Artery Disease  . Recent STEMI    History of Present Illness: Charles Brooks is a 64 y.o. male presenting for a transition of care visit, recent hospital discharge from [facility] on February 27 - this is our first meeting. I reviewed the recent records. Charles Brooks presented with evidence of ACS by ECG, underwent cardiac catheterization demonstrating an occluded nondominant RCA with distal disease extending into the marginal branch. He underwent placement of DES to the RCA with PTCA of the right marginal branch. Residual disease included a 50-60% proximal LAD, 80% small obtuse marginal, both managed medically. LVEF was reported at 55% by ventriculography.  He is here with his wife today for a follow-up visit. States that he is not having any chest pain symptoms, reports compliance with his medications. Still not back to baseline in terms of energy level. He does plan to start cardiac rehabilitation at [facility] with orientation next week. 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 He and his wife  have the understanding that he will be able to complete cardiac rehabilitation prior to returning to work.  In reviewing his serial lab work it looks like he has an element of chronic kidney disease. Discharge creatinine was 1.7. He is not on an ACE inhibitor or ARB.  Today we discussed cardiac rehabilitation, gradually increasing activity, also reviewed appropriate diet and sodium restriction.   Past Medical History  Diagnosis Date  . Arthritis   . STEMI (ST elevation myocardial infarction)     07/2014  . CAD (coronary artery disease), native coronary artery     DES RCA and PTCA RVM 07/2014, LVEF 55%  .  CKD (chronic kidney disease) stage 3, GFR 30-59 ml/min     Past Surgical History  Procedure Laterality Date  . Cholecystectomy    . Removal of kidney stones      Open  . Knee arthroscopy Right   . Extracorporeal shock wave lithotripsy    . Colonoscopy with propofol N/A 11/08/2013    Procedure: COLONOSCOPY WITH PROPOFOL;  Surgeon: [name], MD;  Location: [facility];  Service: General;  Laterality: N/A;  in cecum at 0741; cecal withdrawal time = 10 min  . Polypectomy N/A 11/08/2013    Procedure: POLYPECTOMY;  Surgeon: [name], MD;  Location: [facility];  Service: General;  Laterality: N/A;  cecal polyp  . Left heart catheterization with coronary angiogram N/A 08/14/2014    Procedure: LEFT HEART CATHETERIZATION WITH CORONARY ANGIOGRAM;  Surgeon: Troy Sine, MD;  LAD 50-60%, CFX OK, OM1 80%(small), RCA 100>>0% w/ 2.25x24 mm Synergy DES, EF 55%    Current Outpatient Prescriptions  Medication Sig Dispense Refill  . aspirin EC 81 MG EC tablet Take 1 tablet (81 mg total) by mouth daily.    Marland Kitchen atorvastatin (LIPITOR) 80 MG tablet Take 1 tablet (80 mg total) by mouth daily at 6 PM. 30 tablet 11  . metoprolol tartrate (LOPRESSOR) 25 MG tablet Take 0.5 tablets (12.5 mg total) by mouth 2 (two) times daily. 45 tablet 11  . nitroGLYCERIN (NITROSTAT) 0.4 MG SL tablet Place 1 tablet (0.4 mg total) under the tongue every 5 (five) minutes x 3 doses as needed  for chest pain. 25 tablet 12  . ticagrelor (BRILINTA) 90 MG TABS tablet Take 1 tablet (90 mg total) by mouth 2 (two) times daily. 60 tablet 11   No current facility-administered medications for this visit.    Allergies:  Codeine   Social History: The patient  reports that he quit smoking about 10 years ago. His smoking use included Cigars. He has never used smokeless tobacco. He reports that he does not drink alcohol or use illicit drugs.   ROS:  Please see the history of present illness. Otherwise, complete review of systems is positive for  none. No palpitations, dizziness, syncope, bleeding episodes. All other systems are reviewed and negative.    Physical Exam: VS:  BP 118/80 mmHg  Pulse 68  Ht 5\' 11"  (1.803 m)  Wt 188 lb (85.276 kg)  BMI 26.23 kg/m2  SpO2 97%, BMI Body mass index is 26.23 kg/(m^2).  Wt Readings from Last 3 Encounters:  08/22/14 188 lb (85.276 kg)  08/16/14 194 lb 3.6 oz (88.1 kg)     General: Patient appears comfortable at rest. HEENT: Conjunctiva and lids normal, oropharynx clear with moist mucosa. Neck: Supple, no elevated JVP or carotid bruits, no thyromegaly. Lungs: Clear to auscultation, nonlabored breathing at rest. Cardiac: Regular rate and rhythm, no S3 or significant systolic murmur, no pericardial rub. Abdomen: Soft, nontender, no hepatomegaly, bowel sounds present, no guarding or rebound. Extremities: No pitting edema, distal pulses 2+. Skin: Warm and dry. Musculoskeletal: No kyphosis. Neuropsychiatric: Alert and oriented x3, affect grossly appropriate.   ECG: ECG is ordered today and reviewed showing sinus rhythm with diffuse ST-T wave changes.   Recent Labwork: 08/14/2014: B Natriuretic Peptide 30.2; TSH 3.423 08/15/2014: BUN 20; Creatinine 1.71*; Hemoglobin 12.7*; Platelets 193; Potassium 4.4; Sodium 135     Component Value Date/Time   CHOL 176 08/14/2014 0730   TRIG 311* 08/14/2014 0730   HDL 24* 08/14/2014 0730   CHOLHDL 7.3 08/14/2014 0730   VLDL 62* 08/14/2014 0730   LDLCALC 90 08/14/2014 0730    Assessment and Plan:  1. CAD status post recent ACS due to occlusion of a nondominant RCA as outlined above. Patient now status post DES to the RCA with PTCA of the right marginal system. Residual disease noted in the LAD and circumflex marginal, managed medically. Plan is to continue medical therapy including the APTT, start cardiac rehabilitation.  2. Hyperlipidemia, recent lipid panel showed elevated triglycerides and low HDL. He was started on high-dose Lipitor. We  discussed diet, will follow-up with FLP and LFT in 2 months.  3. Arthritis, previously had been taking Aleve twice daily. I cautioned him not to use this medication regularly at this point in light of the DAPT and increased bleeding risk, also renal insufficiency.  4. Apparent CKD, stage 3. Follow-up BMET in the next few weeks.   Current medicines are reviewed at length with the patient today.  The patient does not have concerns regarding medicines.   Orders Placed This Encounter  Procedures  . Hepatic function panel  . Lipid panel  . EKG 12-Lead    Disposition: FU with me in 2 months.   Signed, Satira Sark, MD, [facility] 08/22/2014 10:51 AM    [facility] [place], [place], [place] [address] Phone: [phone]; Fax: [phone]

## 2014-08-22 NOTE — Telephone Encounter (Signed)
Patient left a message with front staff that he needed a note faxed to his job [employer] [phone] stating that he can return to work after he finishes cardiac rehab that lasts a total of 12 weeks. MD informed of patient's request and advised that we get the company to let office know specifically what the patient needs.

## 2014-08-22 NOTE — Patient Instructions (Addendum)
Your physician recommends that you schedule a follow-up appointment in: 2  Months. Your physician recommends that you continue on your current medications as directed. Please refer to the Current Medication list given to you today. Your physician recommends that you return for a FASTING lipid/liver profile in 2 months just before your next visit. Your physician recommends that you return for lab work in: 2 week to check your BMET.

## 2014-08-26 ENCOUNTER — Encounter (HOSPITAL_COMMUNITY)
Admission: RE | Admit: 2014-08-26 | Discharge: 2014-08-26 | Disposition: A | Discharge status: Home / Self Care | Payer: BLUE CROSS/BLUE SHIELD | Source: Ambulatory Visit | Attending: Cardiology | Admitting: Cardiology

## 2014-08-26 ENCOUNTER — Encounter (HOSPITAL_COMMUNITY): Payer: Self-pay

## 2014-08-26 VITALS — BP 118/68 | HR 73 | Ht 72.0 in | Wt 189.6 lb

## 2014-08-26 DIAGNOSIS — Z955 Presence of coronary angioplasty implant and graft: Secondary | ICD-10-CM | POA: Insufficient documentation

## 2014-08-26 DIAGNOSIS — M1990 Unspecified osteoarthritis, unspecified site: Secondary | ICD-10-CM | POA: Insufficient documentation

## 2014-08-26 DIAGNOSIS — I213 ST elevation (STEMI) myocardial infarction of unspecified site: Secondary | ICD-10-CM | POA: Insufficient documentation

## 2014-08-26 HISTORY — DX: Hyperlipidemia, unspecified: E78.5

## 2014-08-26 HISTORY — DX: Essential (primary) hypertension: I10

## 2014-08-26 NOTE — Patient Instructions (Signed)
Pt has finished orientation and is scheduled to start CR on September 01, 2014 at 0815. Pt has been instructed to arrive to class 15 minutes early for scheduled class. Pt has been instructed to wear comfortable clothing and shoes with rubber soles. Pt has been told to take their medications 1 hour prior to coming to class.  If the patient is not going to attend class, he has been instructed to call.

## 2014-08-26 NOTE — Progress Notes (Signed)
Cardiac/Pulmonary Rehab Medication Review by a Pharmacist  Does the patient  feel that his/her medications are working for him/her?  yes  Has the patient been experiencing any side effects to the medications prescribed?  no  Does the patient measure his/her own blood pressure or blood glucose at home?  no   Does the patient have any problems obtaining medications due to transportation or finances?   no  Understanding of regimen: excellent Understanding of indications: excellent Potential of compliance: excellent  Questions asked to Determine Patient Understanding of Medication Regimen:  1. What is the name of the medication?  2. What is the medication used for?  3. When should it be taken?  4. How much should be taken?  5. How will you take it?  6. What side effects should you report?  Understanding Defined as: Excellent: All questions above are correct Good: Questions 1-4 are correct Fair: Questions 1-2 are correct  Poor: 1 or none of the above questions are correct   Pharmacist comments: Pt states he does not notice any side effects from current medications.  Can get medication from [facility] or [facility] (mail order).  No issues noted.    Hart Robinsons A 08/26/2014 3:01 PM

## 2014-08-26 NOTE — Progress Notes (Signed)
Patient referred to Cardiac Rehab by Dr. Domenic Polite due to MI (I21.3).  Dr Domenic Polite is his cardiologist and Dr. Scotty Court is his PCP.  During orientation advised patient on arrival and appointment times what to wear, what to do before, during and after exercise.  Reviewed attendance and class policy.  Talked about inclement weather and class consultation policy. Patient is scheduled to start cardiac Rehab on September 01, 2014 at 0815. Patient was advised to come to class 5 minutes before class starts.  He was also given instructions on meeting with the dietician and attending the Family Structure classes. Pt is eager to get started.  Patient able to finish pre 6 minute walk test.

## 2014-09-01 ENCOUNTER — Encounter (HOSPITAL_COMMUNITY)
Admission: RE | Admit: 2014-09-01 | Discharge: 2014-09-01 | Disposition: A | Discharge status: Home / Self Care | Payer: BLUE CROSS/BLUE SHIELD | Source: Ambulatory Visit | Attending: Cardiology | Admitting: Cardiology

## 2014-09-01 DIAGNOSIS — Z955 Presence of coronary angioplasty implant and graft: Secondary | ICD-10-CM | POA: Diagnosis not present

## 2014-09-01 DIAGNOSIS — I213 ST elevation (STEMI) myocardial infarction of unspecified site: Secondary | ICD-10-CM | POA: Diagnosis present

## 2014-09-03 ENCOUNTER — Encounter: Payer: BLUE CROSS/BLUE SHIELD | Admitting: Cardiology

## 2014-09-03 ENCOUNTER — Encounter (HOSPITAL_COMMUNITY)
Admission: RE | Admit: 2014-09-03 | Discharge: 2014-09-03 | Disposition: A | Discharge status: Home / Self Care | Payer: BLUE CROSS/BLUE SHIELD | Source: Ambulatory Visit | Attending: Cardiology | Admitting: Cardiology

## 2014-09-03 DIAGNOSIS — I213 ST elevation (STEMI) myocardial infarction of unspecified site: Secondary | ICD-10-CM | POA: Diagnosis not present

## 2014-09-05 ENCOUNTER — Encounter (HOSPITAL_COMMUNITY)
Admission: RE | Admit: 2014-09-05 | Discharge: 2014-09-05 | Disposition: A | Discharge status: Home / Self Care | Payer: BLUE CROSS/BLUE SHIELD | Source: Ambulatory Visit | Attending: Cardiology | Admitting: Cardiology

## 2014-09-05 DIAGNOSIS — I213 ST elevation (STEMI) myocardial infarction of unspecified site: Secondary | ICD-10-CM | POA: Diagnosis not present

## 2014-09-05 NOTE — Telephone Encounter (Signed)
Wife informed that job needed to contact us directly about what he needed.

## 2014-09-08 ENCOUNTER — Encounter (HOSPITAL_COMMUNITY)
Admission: RE | Admit: 2014-09-08 | Discharge: 2014-09-08 | Disposition: A | Discharge status: Home / Self Care | Payer: BLUE CROSS/BLUE SHIELD | Source: Ambulatory Visit | Attending: Cardiology | Admitting: Cardiology

## 2014-09-08 ENCOUNTER — Telehealth: Payer: Self-pay | Admitting: *Deleted

## 2014-09-08 DIAGNOSIS — I213 ST elevation (STEMI) myocardial infarction of unspecified site: Secondary | ICD-10-CM | POA: Diagnosis not present

## 2014-09-08 NOTE — Telephone Encounter (Signed)
Patient informed. 

## 2014-09-08 NOTE — Telephone Encounter (Signed)
-----   Message from Satira Sark, MD sent at 09/05/2014  3:33 PM EDT ----- Reviewed. Renal dysfunction is stable at 1.67, consistent with his recent hospital lab work (and likely chronic). No changes for now. This can be followed over time.

## 2014-09-10 ENCOUNTER — Encounter (HOSPITAL_COMMUNITY)
Admission: RE | Admit: 2014-09-10 | Discharge: 2014-09-10 | Disposition: A | Discharge status: Home / Self Care | Payer: BLUE CROSS/BLUE SHIELD | Source: Ambulatory Visit | Attending: Cardiology | Admitting: Cardiology

## 2014-09-10 DIAGNOSIS — I213 ST elevation (STEMI) myocardial infarction of unspecified site: Secondary | ICD-10-CM | POA: Diagnosis not present

## 2014-09-12 ENCOUNTER — Encounter (HOSPITAL_COMMUNITY)
Admission: RE | Admit: 2014-09-12 | Discharge: 2014-09-12 | Disposition: A | Discharge status: Home / Self Care | Payer: BLUE CROSS/BLUE SHIELD | Source: Ambulatory Visit | Attending: Cardiology | Admitting: Cardiology

## 2014-09-12 DIAGNOSIS — I213 ST elevation (STEMI) myocardial infarction of unspecified site: Secondary | ICD-10-CM | POA: Diagnosis not present

## 2014-09-15 ENCOUNTER — Encounter (HOSPITAL_COMMUNITY)
Admission: RE | Admit: 2014-09-15 | Discharge: 2014-09-15 | Disposition: A | Discharge status: Home / Self Care | Payer: BLUE CROSS/BLUE SHIELD | Source: Ambulatory Visit | Attending: Cardiology | Admitting: Cardiology

## 2014-09-15 DIAGNOSIS — I213 ST elevation (STEMI) myocardial infarction of unspecified site: Secondary | ICD-10-CM | POA: Diagnosis not present

## 2014-09-17 ENCOUNTER — Encounter (HOSPITAL_COMMUNITY)
Admission: RE | Admit: 2014-09-17 | Discharge: 2014-09-17 | Disposition: A | Discharge status: Home / Self Care | Payer: BLUE CROSS/BLUE SHIELD | Source: Ambulatory Visit | Attending: Cardiology | Admitting: Cardiology

## 2014-09-17 DIAGNOSIS — I213 ST elevation (STEMI) myocardial infarction of unspecified site: Secondary | ICD-10-CM | POA: Diagnosis not present

## 2014-09-17 NOTE — Progress Notes (Signed)
Cardiac Rehabilitation Program Outcomes Report   Orientation:  08/26/14 Graduate Date:  tbd Discharge Date:  tbd # of sessions completed: 3  Cardiologist: Grant Ruts MD:  Lorette Ang Time:  0815  A.  Exercise Program:  Tolerates exercise @ 1.90 METS for 15 minutes and Walk Test Results:  Pre: 2.75 mets  B.  Mental Health:  Good mental attitude  C.  Education/Instruction/Skills  Accurately checks own pulse.  Rest:  64  Exercise:  91  Uses Perceived Exertion Scale and/or Dyspnea Scale  D.  Nutrition/Weight Control/Body Composition:  Adherence to prescribed nutrition program: good    E.  Blood Lipids    Lab Results  Component Value Date   CHOL 176 08/14/2014   HDL 24* 08/14/2014   LDLCALC 90 08/14/2014   TRIG 311* 08/14/2014   CHOLHDL 7.3 08/14/2014    F.  Lifestyle Changes:  Making positive lifestyle changes and Not smoking:  Quit 2005  G.  Symptoms noted with exercise:  Asymptomatic  Report Completed By:  Stevphen Rochester RN   Comments:  This is patients first week note.

## 2014-09-19 ENCOUNTER — Encounter (HOSPITAL_COMMUNITY)
Admission: RE | Admit: 2014-09-19 | Discharge: 2014-09-19 | Disposition: A | Discharge status: Home / Self Care | Payer: BLUE CROSS/BLUE SHIELD | Source: Ambulatory Visit | Attending: Cardiology | Admitting: Cardiology

## 2014-09-19 DIAGNOSIS — I213 ST elevation (STEMI) myocardial infarction of unspecified site: Secondary | ICD-10-CM | POA: Insufficient documentation

## 2014-09-19 DIAGNOSIS — Z955 Presence of coronary angioplasty implant and graft: Secondary | ICD-10-CM | POA: Diagnosis not present

## 2014-09-22 ENCOUNTER — Encounter (HOSPITAL_COMMUNITY)
Admission: RE | Admit: 2014-09-22 | Discharge: 2014-09-22 | Disposition: A | Discharge status: Home / Self Care | Payer: BLUE CROSS/BLUE SHIELD | Source: Ambulatory Visit | Attending: Cardiology | Admitting: Cardiology

## 2014-09-22 DIAGNOSIS — I213 ST elevation (STEMI) myocardial infarction of unspecified site: Secondary | ICD-10-CM | POA: Diagnosis not present

## 2014-09-24 ENCOUNTER — Encounter (HOSPITAL_COMMUNITY)
Admission: RE | Admit: 2014-09-24 | Discharge: 2014-09-24 | Disposition: A | Discharge status: Home / Self Care | Payer: BLUE CROSS/BLUE SHIELD | Source: Ambulatory Visit | Attending: Cardiology | Admitting: Cardiology

## 2014-09-24 DIAGNOSIS — I213 ST elevation (STEMI) myocardial infarction of unspecified site: Secondary | ICD-10-CM | POA: Diagnosis not present

## 2014-09-26 ENCOUNTER — Encounter (HOSPITAL_COMMUNITY)
Admission: RE | Admit: 2014-09-26 | Discharge: 2014-09-26 | Disposition: A | Discharge status: Home / Self Care | Payer: BLUE CROSS/BLUE SHIELD | Source: Ambulatory Visit | Attending: Cardiology | Admitting: Cardiology

## 2014-09-26 DIAGNOSIS — I213 ST elevation (STEMI) myocardial infarction of unspecified site: Secondary | ICD-10-CM | POA: Diagnosis not present

## 2014-09-29 ENCOUNTER — Encounter (HOSPITAL_COMMUNITY)
Admission: RE | Admit: 2014-09-29 | Discharge: 2014-09-29 | Disposition: A | Discharge status: Home / Self Care | Payer: BLUE CROSS/BLUE SHIELD | Source: Ambulatory Visit | Attending: Cardiology | Admitting: Cardiology

## 2014-09-29 DIAGNOSIS — I213 ST elevation (STEMI) myocardial infarction of unspecified site: Secondary | ICD-10-CM | POA: Diagnosis not present

## 2014-10-01 ENCOUNTER — Encounter (HOSPITAL_COMMUNITY)
Admission: RE | Admit: 2014-10-01 | Discharge: 2014-10-01 | Disposition: A | Discharge status: Home / Self Care | Payer: BLUE CROSS/BLUE SHIELD | Source: Ambulatory Visit | Attending: Cardiology | Admitting: Cardiology

## 2014-10-01 DIAGNOSIS — I213 ST elevation (STEMI) myocardial infarction of unspecified site: Secondary | ICD-10-CM | POA: Diagnosis not present

## 2014-10-03 ENCOUNTER — Encounter (HOSPITAL_COMMUNITY)
Admission: RE | Admit: 2014-10-03 | Discharge: 2014-10-03 | Disposition: A | Discharge status: Home / Self Care | Payer: BLUE CROSS/BLUE SHIELD | Source: Ambulatory Visit | Attending: Cardiology | Admitting: Cardiology

## 2014-10-03 DIAGNOSIS — I213 ST elevation (STEMI) myocardial infarction of unspecified site: Secondary | ICD-10-CM | POA: Diagnosis not present

## 2014-10-06 ENCOUNTER — Encounter (HOSPITAL_COMMUNITY)
Admission: RE | Admit: 2014-10-06 | Discharge: 2014-10-06 | Disposition: A | Discharge status: Home / Self Care | Payer: BLUE CROSS/BLUE SHIELD | Source: Ambulatory Visit | Attending: Cardiology | Admitting: Cardiology

## 2014-10-06 DIAGNOSIS — I213 ST elevation (STEMI) myocardial infarction of unspecified site: Secondary | ICD-10-CM | POA: Diagnosis not present

## 2014-10-08 ENCOUNTER — Encounter (HOSPITAL_COMMUNITY)
Admission: RE | Admit: 2014-10-08 | Discharge: 2014-10-08 | Disposition: A | Discharge status: Home / Self Care | Payer: BLUE CROSS/BLUE SHIELD | Source: Ambulatory Visit | Attending: Cardiology | Admitting: Cardiology

## 2014-10-08 DIAGNOSIS — I213 ST elevation (STEMI) myocardial infarction of unspecified site: Secondary | ICD-10-CM | POA: Diagnosis not present

## 2014-10-10 ENCOUNTER — Encounter (HOSPITAL_COMMUNITY)
Admission: RE | Admit: 2014-10-10 | Discharge: 2014-10-10 | Disposition: A | Discharge status: Home / Self Care | Payer: BLUE CROSS/BLUE SHIELD | Source: Ambulatory Visit | Attending: Cardiology | Admitting: Cardiology

## 2014-10-10 DIAGNOSIS — I213 ST elevation (STEMI) myocardial infarction of unspecified site: Secondary | ICD-10-CM | POA: Diagnosis not present

## 2014-10-13 ENCOUNTER — Encounter (HOSPITAL_COMMUNITY)
Admission: RE | Admit: 2014-10-13 | Discharge: 2014-10-13 | Disposition: A | Discharge status: Home / Self Care | Payer: BLUE CROSS/BLUE SHIELD | Source: Ambulatory Visit | Attending: Cardiology | Admitting: Cardiology

## 2014-10-13 DIAGNOSIS — I213 ST elevation (STEMI) myocardial infarction of unspecified site: Secondary | ICD-10-CM | POA: Diagnosis not present

## 2014-10-15 ENCOUNTER — Encounter (HOSPITAL_COMMUNITY)
Admission: RE | Admit: 2014-10-15 | Discharge: 2014-10-15 | Disposition: A | Discharge status: Home / Self Care | Payer: BLUE CROSS/BLUE SHIELD | Source: Ambulatory Visit | Attending: Cardiology | Admitting: Cardiology

## 2014-10-15 DIAGNOSIS — I213 ST elevation (STEMI) myocardial infarction of unspecified site: Secondary | ICD-10-CM | POA: Diagnosis not present

## 2014-10-17 ENCOUNTER — Encounter (HOSPITAL_COMMUNITY)
Admission: RE | Admit: 2014-10-17 | Discharge: 2014-10-17 | Disposition: A | Discharge status: Home / Self Care | Payer: BLUE CROSS/BLUE SHIELD | Source: Ambulatory Visit | Attending: Cardiology | Admitting: Cardiology

## 2014-10-17 DIAGNOSIS — I213 ST elevation (STEMI) myocardial infarction of unspecified site: Secondary | ICD-10-CM | POA: Diagnosis not present

## 2014-10-20 ENCOUNTER — Encounter (HOSPITAL_COMMUNITY)
Admission: RE | Admit: 2014-10-20 | Discharge: 2014-10-20 | Disposition: A | Discharge status: Home / Self Care | Payer: BLUE CROSS/BLUE SHIELD | Source: Ambulatory Visit | Attending: Cardiology | Admitting: Cardiology

## 2014-10-20 DIAGNOSIS — I213 ST elevation (STEMI) myocardial infarction of unspecified site: Secondary | ICD-10-CM | POA: Diagnosis not present

## 2014-10-20 DIAGNOSIS — Z955 Presence of coronary angioplasty implant and graft: Secondary | ICD-10-CM | POA: Diagnosis not present

## 2014-10-22 ENCOUNTER — Encounter (HOSPITAL_COMMUNITY)
Admission: RE | Admit: 2014-10-22 | Discharge: 2014-10-22 | Disposition: A | Discharge status: Home / Self Care | Payer: BLUE CROSS/BLUE SHIELD | Source: Ambulatory Visit | Attending: Cardiology | Admitting: Cardiology

## 2014-10-22 DIAGNOSIS — I213 ST elevation (STEMI) myocardial infarction of unspecified site: Secondary | ICD-10-CM | POA: Diagnosis not present

## 2014-10-24 ENCOUNTER — Encounter (HOSPITAL_COMMUNITY)
Admission: RE | Admit: 2014-10-24 | Discharge: 2014-10-24 | Disposition: A | Discharge status: Home / Self Care | Payer: BLUE CROSS/BLUE SHIELD | Source: Ambulatory Visit | Attending: Cardiology | Admitting: Cardiology

## 2014-10-24 DIAGNOSIS — I213 ST elevation (STEMI) myocardial infarction of unspecified site: Secondary | ICD-10-CM | POA: Diagnosis not present

## 2014-10-24 NOTE — Progress Notes (Signed)
Cardiac Rehabilitation Program Outcomes Report   Orientation:  08/26/14 Graduate Date:  tbd Discharge Date:  tbd # of sessions completed: 18  Cardiologist: Grant Ruts MD:  Lorette Ang Time:  0815  A.  Exercise Program:  Tolerates exercise @ 3.56 METS for 15 minutes  B.  Mental Health:  Good mental attitude  C.  Education/Instruction/Skills  Accurately checks own pulse.  Rest:  81  Exercise:  90 and Knows THR for exercise  Uses Perceived Exertion Scale and/or Dyspnea Scale  D.  Nutrition/Weight Control/Body Composition:  Adherence to prescribed nutrition program: good    E.  Blood Lipids    Lab Results  Component Value Date   CHOL 176 08/14/2014   HDL 24* 08/14/2014   LDLCALC 90 08/14/2014   TRIG 311* 08/14/2014   CHOLHDL 7.3 08/14/2014    F.  Lifestyle Changes:  Making positive lifestyle changes  G.  Symptoms noted with exercise:  Asymptomatic  Report Completed By:  Stevphen Rochester RN   Comments:  This is patients halfway progress note for [facility].

## 2014-10-27 ENCOUNTER — Encounter (HOSPITAL_COMMUNITY)
Admission: RE | Admit: 2014-10-27 | Discharge: 2014-10-27 | Disposition: A | Discharge status: Home / Self Care | Payer: BLUE CROSS/BLUE SHIELD | Source: Ambulatory Visit | Attending: Cardiology | Admitting: Cardiology

## 2014-10-27 DIAGNOSIS — I213 ST elevation (STEMI) myocardial infarction of unspecified site: Secondary | ICD-10-CM | POA: Diagnosis not present

## 2014-10-29 ENCOUNTER — Encounter (HOSPITAL_COMMUNITY)
Admission: RE | Admit: 2014-10-29 | Discharge: 2014-10-29 | Disposition: A | Discharge status: Home / Self Care | Payer: BLUE CROSS/BLUE SHIELD | Source: Ambulatory Visit | Attending: Cardiology | Admitting: Cardiology

## 2014-10-29 ENCOUNTER — Encounter: Payer: Self-pay | Admitting: Cardiology

## 2014-10-29 ENCOUNTER — Ambulatory Visit (INDEPENDENT_AMBULATORY_CARE_PROVIDER_SITE_OTHER): Payer: BLUE CROSS/BLUE SHIELD | Admitting: Cardiology

## 2014-10-29 VITALS — BP 154/80 | HR 75 | Ht 71.0 in | Wt 180.0 lb

## 2014-10-29 DIAGNOSIS — I2510 Atherosclerotic heart disease of native coronary artery without angina pectoris: Secondary | ICD-10-CM | POA: Diagnosis not present

## 2014-10-29 DIAGNOSIS — I10 Essential (primary) hypertension: Secondary | ICD-10-CM | POA: Diagnosis not present

## 2014-10-29 DIAGNOSIS — I213 ST elevation (STEMI) myocardial infarction of unspecified site: Secondary | ICD-10-CM | POA: Diagnosis not present

## 2014-10-29 DIAGNOSIS — E785 Hyperlipidemia, unspecified: Secondary | ICD-10-CM | POA: Diagnosis not present

## 2014-10-29 NOTE — Progress Notes (Signed)
Cardiology Office Note  Date: 10/29/2014   ID: Charles Brooks, DOB [birth year], MRN [id number]  PCP: Deloria Lair, MD  Primary Cardiologist: Rozann Lesches, MD   Chief Complaint  Patient presents with  . Coronary Artery Disease  . Hyperlipidemia    History of Present Illness: Charles Brooks is a 64 y.o. male seen by me for the first time in March of this year, records outlined in the prior note. He has been participating in cardiac rehabilitation, doing very well. He states that he has been feeling good, no angina symptoms, reports compliance with his medications.  Follow-up lab work is noted below. He had additional lab work done at [facility], results pending for review.  We discussed his continuing cardiac rehabilitation to completion, then continuing to exercise on his own.   Past Medical History  Diagnosis Date  . Arthritis   . STEMI (ST elevation myocardial infarction)     07/2014  . CAD (coronary artery disease), native coronary artery     DES RCA and PTCA RVM 07/2014, LVEF 55%  . CKD (chronic kidney disease) stage 3, GFR 30-59 ml/min   . Hypertension   . Hyperlipidemia      Current Outpatient Prescriptions  Medication Sig Dispense Refill  . aspirin EC 81 MG EC tablet Take 1 tablet (81 mg total) by mouth daily.    Marland Kitchen atorvastatin (LIPITOR) 80 MG tablet Take 1 tablet (80 mg total) by mouth daily at 6 PM. 30 tablet 11  . metoprolol tartrate (LOPRESSOR) 25 MG tablet Take 0.5 tablets (12.5 mg total) by mouth 2 (two) times daily. 45 tablet 11  . nitroGLYCERIN (NITROSTAT) 0.4 MG SL tablet Place 1 tablet (0.4 mg total) under the tongue every 5 (five) minutes x 3 doses as needed for chest pain. 25 tablet 12  . ticagrelor (BRILINTA) 90 MG TABS tablet Take 1 tablet (90 mg total) by mouth 2 (two) times daily. 60 tablet 11   No current facility-administered medications for this visit.    Allergies:  Codeine   Social History: The patient  reports that he quit  smoking about 10 years ago. His smoking use included Cigars. He has never used smokeless tobacco. He reports that he does not drink alcohol or use illicit drugs.   ROS:  Please see the history of present illness. Otherwise, complete review of systems is positive for none.  All other systems are reviewed and negative.   Physical Exam: VS:  BP 154/80 mmHg  Pulse 75  Ht 5\' 11"  (1.803 m)  Wt 180 lb (81.647 kg)  BMI 25.12 kg/m2  SpO2 99%, BMI Body mass index is 25.12 kg/(m^2).  Wt Readings from Last 3 Encounters:  10/29/14 180 lb (81.647 kg)  08/22/14 188 lb (85.276 kg)  08/16/14 194 lb 3.6 oz (88.1 kg)    General: Patient appears comfortable at rest. HEENT: Conjunctiva and lids normal, oropharynx clear with moist mucosa. Neck: Supple, no elevated JVP or carotid bruits, no thyromegaly. Lungs: Clear to auscultation, nonlabored breathing at rest. Cardiac: Regular rate and rhythm, no S3 or significant systolic murmur, no pericardial rub. Abdomen: Soft, nontender, no hepatomegaly, bowel sounds present, no guarding or rebound. Extremities: No pitting edema, distal pulses 2+. Skin: Warm and dry. Musculoskeletal: No kyphosis. Neuropsychiatric: Alert and oriented x3, affect grossly appropriate.   ECG: ECG is not ordered today.   Recent Labwork:  09/05/2014: BUN 25, creatinine 1.6, potassium 4.4  Assessment and Plan:  1. Symptomatically stable with history of  CAD status post DES to the RCA and PTCA to the RV marginal in February of this year. He will continue cardiac rehabilitation to completion, no changes made to current medical regimen.  2. Hyperlipidemia, on Lipitor. Follow-up recent lab work from [facility].  3. Essential hypertension, no changes to current regimen.  Current medicines were reviewed with the patient today.   Disposition: FU with me in 3 months.   Signed, Satira Sark, MD, [facility] 10/29/2014 10:39 AM    [facility] [place], [place], [place] [address] Phone: [phone]; Fax: [phone]

## 2014-10-29 NOTE — Patient Instructions (Signed)
Your physician recommends that you continue on your current medications as directed. Please refer to the Current Medication list given to you today.  Your physician recommends that you schedule a follow-up appointment in: 3 months  

## 2014-10-31 ENCOUNTER — Encounter (HOSPITAL_COMMUNITY)
Admission: RE | Admit: 2014-10-31 | Discharge: 2014-10-31 | Disposition: A | Discharge status: Home / Self Care | Payer: BLUE CROSS/BLUE SHIELD | Source: Ambulatory Visit | Attending: Cardiology | Admitting: Cardiology

## 2014-10-31 ENCOUNTER — Telehealth: Payer: Self-pay | Admitting: *Deleted

## 2014-10-31 DIAGNOSIS — I213 ST elevation (STEMI) myocardial infarction of unspecified site: Secondary | ICD-10-CM | POA: Diagnosis not present

## 2014-10-31 NOTE — Telephone Encounter (Signed)
-----   Message from Satira Sark, MD sent at 10/30/2014  4:07 PM EDT ----- Reviewed. LFTs normal, total cholesterol 108, LDL 63. He can cut his Lipitor to 40 mg daily for now.

## 2014-11-03 ENCOUNTER — Encounter (HOSPITAL_COMMUNITY)
Admission: RE | Admit: 2014-11-03 | Discharge: 2014-11-03 | Disposition: A | Discharge status: Home / Self Care | Payer: BLUE CROSS/BLUE SHIELD | Source: Ambulatory Visit | Attending: Cardiology | Admitting: Cardiology

## 2014-11-03 DIAGNOSIS — I213 ST elevation (STEMI) myocardial infarction of unspecified site: Secondary | ICD-10-CM | POA: Diagnosis not present

## 2014-11-05 ENCOUNTER — Encounter (HOSPITAL_COMMUNITY)
Admission: RE | Admit: 2014-11-05 | Discharge: 2014-11-05 | Disposition: A | Discharge status: Home / Self Care | Payer: BLUE CROSS/BLUE SHIELD | Source: Ambulatory Visit | Attending: Cardiology | Admitting: Cardiology

## 2014-11-05 DIAGNOSIS — I213 ST elevation (STEMI) myocardial infarction of unspecified site: Secondary | ICD-10-CM | POA: Diagnosis not present

## 2014-11-06 MED ORDER — ATORVASTATIN CALCIUM 40 MG PO TABS: 40.0000 mg | ORAL_TABLET | Freq: Every day | ORAL | Status: DC

## 2014-11-06 NOTE — Telephone Encounter (Signed)
Patient informed and verbalized understanding of plan. 

## 2014-11-07 ENCOUNTER — Encounter (HOSPITAL_COMMUNITY)
Admission: RE | Admit: 2014-11-07 | Discharge: 2014-11-07 | Disposition: A | Discharge status: Home / Self Care | Payer: BLUE CROSS/BLUE SHIELD | Source: Ambulatory Visit | Attending: Cardiology | Admitting: Cardiology

## 2014-11-07 DIAGNOSIS — I213 ST elevation (STEMI) myocardial infarction of unspecified site: Secondary | ICD-10-CM | POA: Diagnosis not present

## 2014-11-10 ENCOUNTER — Encounter (HOSPITAL_COMMUNITY)
Admission: RE | Admit: 2014-11-10 | Discharge: 2014-11-10 | Disposition: A | Discharge status: Home / Self Care | Payer: BLUE CROSS/BLUE SHIELD | Source: Ambulatory Visit | Attending: Cardiology | Admitting: Cardiology

## 2014-11-10 DIAGNOSIS — I213 ST elevation (STEMI) myocardial infarction of unspecified site: Secondary | ICD-10-CM | POA: Diagnosis not present

## 2014-11-12 ENCOUNTER — Encounter (HOSPITAL_COMMUNITY)
Admission: RE | Admit: 2014-11-12 | Discharge: 2014-11-12 | Disposition: A | Discharge status: Home / Self Care | Payer: BLUE CROSS/BLUE SHIELD | Source: Ambulatory Visit | Attending: Cardiology | Admitting: Cardiology

## 2014-11-12 DIAGNOSIS — I213 ST elevation (STEMI) myocardial infarction of unspecified site: Secondary | ICD-10-CM | POA: Diagnosis not present

## 2014-11-14 ENCOUNTER — Encounter (HOSPITAL_COMMUNITY)
Admission: RE | Admit: 2014-11-14 | Discharge: 2014-11-14 | Disposition: A | Discharge status: Home / Self Care | Payer: BLUE CROSS/BLUE SHIELD | Source: Ambulatory Visit | Attending: Cardiology | Admitting: Cardiology

## 2014-11-14 DIAGNOSIS — I213 ST elevation (STEMI) myocardial infarction of unspecified site: Secondary | ICD-10-CM | POA: Diagnosis not present

## 2014-11-17 ENCOUNTER — Encounter (HOSPITAL_COMMUNITY): Payer: BLUE CROSS/BLUE SHIELD

## 2014-11-19 ENCOUNTER — Encounter (HOSPITAL_COMMUNITY)
Admission: RE | Admit: 2014-11-19 | Discharge: 2014-11-19 | Disposition: A | Discharge status: Home / Self Care | Payer: BLUE CROSS/BLUE SHIELD | Source: Ambulatory Visit | Attending: Cardiology | Admitting: Cardiology

## 2014-11-19 DIAGNOSIS — Z955 Presence of coronary angioplasty implant and graft: Secondary | ICD-10-CM | POA: Diagnosis not present

## 2014-11-19 DIAGNOSIS — I213 ST elevation (STEMI) myocardial infarction of unspecified site: Secondary | ICD-10-CM | POA: Insufficient documentation

## 2014-11-21 ENCOUNTER — Encounter (HOSPITAL_COMMUNITY)
Admission: RE | Admit: 2014-11-21 | Discharge: 2014-11-21 | Disposition: A | Discharge status: Home / Self Care | Payer: BLUE CROSS/BLUE SHIELD | Source: Ambulatory Visit | Attending: Cardiology | Admitting: Cardiology

## 2014-11-21 DIAGNOSIS — I213 ST elevation (STEMI) myocardial infarction of unspecified site: Secondary | ICD-10-CM | POA: Diagnosis not present

## 2014-11-24 ENCOUNTER — Encounter (HOSPITAL_COMMUNITY)
Admission: RE | Admit: 2014-11-24 | Discharge: 2014-11-24 | Disposition: A | Discharge status: Home / Self Care | Payer: BLUE CROSS/BLUE SHIELD | Source: Ambulatory Visit | Attending: Cardiology | Admitting: Cardiology

## 2014-11-24 DIAGNOSIS — I213 ST elevation (STEMI) myocardial infarction of unspecified site: Secondary | ICD-10-CM | POA: Diagnosis not present

## 2014-11-26 ENCOUNTER — Encounter (HOSPITAL_COMMUNITY): Payer: BLUE CROSS/BLUE SHIELD

## 2014-12-08 NOTE — Progress Notes (Signed)
Cardiac Rehabilitation Program Outcomes Report   Orientation:  08/26/14 Graduate Date:  11/24/14 Discharge Date:  11/24/14 # of sessions completed: 36  Cardiologist: Grant Ruts MD:  Lorette Ang Time:  0815  A.  Exercise Program:  Tolerates exercise @ 3.56 METS for 15 minutes, Walk Test Results:  Post: 2.96 and Improved functional capacity  8.03 %  B.  Mental Health:  Good mental attitude  C.  Education/Instruction/Skills  Accurately checks own pulse.  Rest:  83  Exercise:  105, Knows THR for exercise and Attended all education classes  Uses Perceived Exertion Scale and/or Dyspnea Scale  D.  Nutrition/Weight Control/Body Composition:  Adherence to prescribed nutrition program: good    E.  Blood Lipids    Lab Results  Component Value Date   CHOL 176 08/14/2014   HDL 24* 08/14/2014   LDLCALC 90 08/14/2014   TRIG 311* 08/14/2014   CHOLHDL 7.3 08/14/2014    F.  Lifestyle Changes:  Making positive lifestyle changes  G.  Symptoms noted with exercise:  Asymptomatic  Report Completed By:  Stevphen Rochester RN   Comments:  Patient has finished [facility]. Patient done very well in program and may join maintenance program in July.

## 2014-12-08 NOTE — Progress Notes (Signed)
Patient is discharged from [facility] today, November 24, 2014 with 36 sessions.  He achieved LTG of 30 minutes of aerobic exercise at max met level of 3.56.  All patient vitals are WNL.  Patient did not meet with dietician.  Discharge instructions have been reviewed in detail and patient expressed an understanding of material given.  Patient plans to exercise at home and possibly join the maintenance program in July. Cardiac Rehab will make 1 month, 6 month and 1 year call backs.  Patient had no complaints of any abnormal S/S or pain on their exit visit.  Patient finished post six minute walk test.   

## 2015-02-02 ENCOUNTER — Encounter: Payer: Self-pay | Admitting: *Deleted

## 2015-02-02 ENCOUNTER — Ambulatory Visit (INDEPENDENT_AMBULATORY_CARE_PROVIDER_SITE_OTHER): Payer: BLUE CROSS/BLUE SHIELD | Admitting: Cardiology

## 2015-02-02 ENCOUNTER — Encounter: Payer: Self-pay | Admitting: Cardiology

## 2015-02-02 VITALS — BP 159/85 | HR 75 | Ht 71.0 in | Wt 174.4 lb

## 2015-02-02 DIAGNOSIS — I10 Essential (primary) hypertension: Secondary | ICD-10-CM

## 2015-02-02 DIAGNOSIS — I2510 Atherosclerotic heart disease of native coronary artery without angina pectoris: Secondary | ICD-10-CM | POA: Diagnosis not present

## 2015-02-02 DIAGNOSIS — E785 Hyperlipidemia, unspecified: Secondary | ICD-10-CM | POA: Diagnosis not present

## 2015-02-02 NOTE — Progress Notes (Signed)
Cardiology Office Note  Date: 02/02/2015   ID: Charles Brooks, DOB [birth year], MRN [id number]  PCP: Deloria Lair, MD  Primary Cardiologist: Rozann Lesches, MD   Chief Complaint  Patient presents with  . Coronary Artery Disease    History of Present Illness: Charles Brooks is a 64 y.o. male last seen in May.  He comes in today for a routine visit. He denies any angina symptoms, states that he has been trying to walk for exercise either early in the morning or late in the evening. This has been difficult with the high heat and humidity.  He completed the cardiac rehabilitation program back in June. Does not sound like he has returned back to work yet. I discussed this with him today, he does not have any specific cardiac restrictions at this time other than being able to maintain outpatient cardiac follow-up.  He reports compliance with his medications, continues on DAPT.  I discussed continuing aspirin and Brilinta through February of next year.  Past Medical History  Diagnosis Date  . Arthritis   . STEMI (ST elevation myocardial infarction)     07/2014  . CAD (coronary artery disease), native coronary artery     DES RCA and PTCA RVM 07/2014, LVEF 55%  . CKD (chronic kidney disease) stage 3, GFR 30-59 ml/min   . Hypertension   . Hyperlipidemia     Current Outpatient Prescriptions  Medication Sig Dispense Refill  . aspirin EC 81 MG EC tablet Take 1 tablet (81 mg total) by mouth daily.    Marland Kitchen atorvastatin (LIPITOR) 40 MG tablet Take 1 tablet (40 mg total) by mouth daily. 90 tablet 3  . metoprolol tartrate (LOPRESSOR) 25 MG tablet Take 0.5 tablets (12.5 mg total) by mouth 2 (two) times daily. 45 tablet 11  . nitroGLYCERIN (NITROSTAT) 0.4 MG SL tablet Place 1 tablet (0.4 mg total) under the tongue every 5 (five) minutes x 3 doses as needed for chest pain. 25 tablet 12  . ticagrelor (BRILINTA) 90 MG TABS tablet Take 1 tablet (90 mg total) by mouth 2 (two) times daily. 60 tablet  11   No current facility-administered medications for this visit.    Allergies:  Codeine   Social History: The patient  reports that he quit smoking about 11 years ago. His smoking use included Cigars. He has never used smokeless tobacco. He reports that he does not drink alcohol or use illicit drugs.   ROS:  Please see the history of present illness. Otherwise, complete review of systems is positive for NYHA class II dyspnea , some exertional fatigue.  All other systems are reviewed and negative.  Physical Exam: VS:  BP 159/85 mmHg  Pulse 75  Ht 5\' 11"  (1.803 m)  Wt 174 lb 6.4 oz (79.107 kg)  BMI 24.33 kg/m2  SpO2 99%, BMI Body mass index is 24.33 kg/(m^2).  Wt Readings from Last 3 Encounters:  02/02/15 174 lb 6.4 oz (79.107 kg)  10/29/14 180 lb (81.647 kg)  08/26/14 189 lb 9.6 oz (86.002 kg)     General: Patient appears comfortable at rest. HEENT: Conjunctiva and lids normal, oropharynx clear with moist mucosa. Neck: Supple, no elevated JVP or carotid bruits, no thyromegaly. Lungs: Clear to auscultation, nonlabored breathing at rest. Cardiac: Regular rate and rhythm, no S3 or significant systolic murmur, no pericardial rub. Abdomen: Soft, nontender, no hepatomegaly, bowel sounds present, no guarding or rebound. Extremities: No pitting edema, distal pulses 2+. Skin: Warm and dry. Musculoskeletal: No  kyphosis. Neuropsychiatric: Alert and oriented x3, affect grossly appropriate.   ECG: ECG is not ordered today.   Recent Labwork:  10/28/2014: AST 14, ALT 26, triglycerides 86, cholesterol 108, HDL 28, LDL 63   Assessment and Plan:  1.  CAD status post DES to the RCA and PTCA to the RVM back in February of this year. He completed cardiac rehabilitation back in June, although it does not sound like he has yet returned back to work. I explained that he should be able to return to his prior duties presuming he is able to continue medical therapy and keep his regular outpatient  follow-up.  2.  Essential hypertension,  The pressure is mildly elevated today. I recommended that he keep follow-up with Dr. Scotty Court. Could consider addition of an ACE inhibitor or ARB.  3.  Hyperlipidemia, well-controlled on current regimen.  Current medicines were reviewed with the patient today.  Disposition: FU with me in 6 months.   Signed, Satira Sark, MD, [facility] 02/02/2015 9:20 AM    [facility] [place], [place], [place] [address] Phone: [phone]; Fax: [phone]

## 2015-02-02 NOTE — Patient Instructions (Signed)
Your physician recommends that you continue on your current medications as directed. Please refer to the Current Medication list given to you today. Your physician recommends that you schedule a follow-up appointment in: 6 months. You will receive a reminder letter in the mail in about 4 months reminding you to call and schedule your appointment. If you don't receive this letter, please contact our office. 

## 2015-03-19 ENCOUNTER — Telehealth: Payer: Self-pay | Admitting: Cardiology

## 2015-03-19 NOTE — Telephone Encounter (Signed)
Brilanta

## 2015-08-06 ENCOUNTER — Encounter: Payer: Self-pay | Admitting: Cardiology

## 2015-08-06 ENCOUNTER — Ambulatory Visit (INDEPENDENT_AMBULATORY_CARE_PROVIDER_SITE_OTHER): Payer: PPO | Admitting: Cardiology

## 2015-08-06 VITALS — BP 108/69 | HR 95 | Ht 72.0 in | Wt 173.4 lb

## 2015-08-06 DIAGNOSIS — E785 Hyperlipidemia, unspecified: Secondary | ICD-10-CM

## 2015-08-06 DIAGNOSIS — I10 Essential (primary) hypertension: Secondary | ICD-10-CM

## 2015-08-06 DIAGNOSIS — I2510 Atherosclerotic heart disease of native coronary artery without angina pectoris: Secondary | ICD-10-CM

## 2015-08-06 NOTE — Progress Notes (Signed)
Cardiology Office Note  Date: 08/06/2015   ID: NYGIL LUTTRULL, DOB 15-Feb-1951, MRN [id number]  PCP: Deloria Lair, MD  Primary Cardiologist: Rozann Lesches, MD   Chief Complaint  Patient presents with  . Coronary Artery Disease    History of Present Illness: Charles Brooks is a 65 y.o. male last seen in August 2016. He presents for a follow-up visit. Reports no angina symptoms and stable NYHA class II dyspnea with typical activities. He has had no interval hospitalizations.  He is a year out from STEMI with percutaneous intervention as detailed below. We discussed coming off Brilinta once he completes the current bottle.  I reviewed his ECG today which shows normal sinus rhythm and nonspecific ST changes.  We discussed his remaining medications, he reports no intolerances. On current dose Lipitor LDL has been well-controlled, most recently 89. He will be having follow-up lab work with Dr. Scotty Court later this spring.  Past Medical History  Diagnosis Date  . Arthritis   . STEMI (ST elevation myocardial infarction) ([place])     07/2014  . CAD (coronary artery disease), native coronary artery     DES RCA and PTCA RVM 07/2014, LVEF 55%  . CKD (chronic kidney disease) stage 3, GFR 30-59 ml/min   . Hypertension   . Hyperlipidemia     Current Outpatient Prescriptions  Medication Sig Dispense Refill  . aspirin EC 81 MG EC tablet Take 1 tablet (81 mg total) by mouth daily.    Marland Kitchen atorvastatin (LIPITOR) 40 MG tablet Take 1 tablet (40 mg total) by mouth daily. 90 tablet 3  . metoprolol tartrate (LOPRESSOR) 25 MG tablet Take 0.5 tablets (12.5 mg total) by mouth 2 (two) times daily. 45 tablet 11  . nitroGLYCERIN (NITROSTAT) 0.4 MG SL tablet Place 1 tablet (0.4 mg total) under the tongue every 5 (five) minutes x 3 doses as needed for chest pain. 25 tablet 12  . ticagrelor (BRILINTA) 90 MG TABS tablet Take 1 tablet (90 mg total) by mouth 2 (two) times daily. 60 tablet 11   No current  facility-administered medications for this visit.   Allergies:  Codeine   Social History: The patient  reports that he quit smoking about 11 years ago. His smoking use included Cigars. He has never used smokeless tobacco. He reports that he does not drink alcohol or use illicit drugs.   ROS:  Please see the history of present illness. Otherwise, complete review of systems is positive for none.  All other systems are reviewed and negative.   Physical Exam: VS:  BP 108/69 mmHg  Pulse 95  Ht 6' (1.829 m)  Wt 173 lb 6.4 oz (78.654 kg)  BMI 23.51 kg/m2  SpO2 99%, BMI Body mass index is 23.51 kg/(m^2).  Wt Readings from Last 3 Encounters:  08/06/15 173 lb 6.4 oz (78.654 kg)  02/02/15 174 lb 6.4 oz (79.107 kg)  10/29/14 180 lb (81.647 kg)    General: Patient appears comfortable at rest. HEENT: Conjunctiva and lids normal, oropharynx clear with moist mucosa. Neck: Supple, no elevated JVP or carotid bruits, no thyromegaly. Lungs: Clear to auscultation, nonlabored breathing at rest. Cardiac: Regular rate and rhythm, no S3 or significant systolic murmur, no pericardial rub. Abdomen: Soft, nontender, no hepatomegaly, bowel sounds present, no guarding or rebound. Extremities: No pitting edema, distal pulses 2+.  ECG: I personally reviewed the prior tracing from 08/22/2014 which showed sinus rhythm with diffuse nonspecific ST-T changes.  Recent Labwork: 08/14/2014: B Natriuretic Peptide 30.2;  TSH 3.423 08/15/2014: BUN 20; Creatinine, Ser 1.71*; Hemoglobin 12.7*; Platelets 193; Potassium 4.4; Sodium 135     Component Value Date/Time   CHOL 176 08/14/2014 0730   TRIG 311* 08/14/2014 0730   HDL 24* 08/14/2014 0730   CHOLHDL 7.3 08/14/2014 0730   VLDL 62* 08/14/2014 0730   LDLCALC 90 08/14/2014 0730  May 2016: AST 14, ALT 26, triglycerides 86, cholesterol 108, HDL 28, LDL 63  Other Studies Reviewed Today:  Cardiac catheterization 08/14/2014: Left main: Angiographically normal vessel which  bifurcated into a large LAD and a large dominant left circumflex coronary artery.  LAD: LAD gave rise to 2 proximal diagonal vessels. Immediately after the takeoff of this second diagonal vessel. The LAD had 50-60% stenosis proximal to a septal perforating artery. The remainder of the LAD was free of significant disease and extended to the LV apex.  Left circumflex: Circumflex was large, dominant vessel that had a diffuse 80% stenosis in a small OM1 branch. The vessel supplied several additional marginal branches and ended in a posterolateral large caliber vessel.   Right coronary artery: Nondominant vessel that had total proximal occlusion with TIMI 0 flow initially.  Following PCI to the RCA, the very proximal RCA was opened. There was total occlusion of a marginal like branch just beyond the bifurcation of a smaller distal RCA branch. He said sites were dilated. There also is a distal 90% marginal branch stenosis which underwent PTCA to the vessel was small caliber and the distal 90% stenosis was reduced to 40%. Will RCA extending into this marginal vessel was dilated with the 2.2524 mm Synergy DES stent with entire region been reduced to 0% with brisk TIMI-3 flow.  Left ventriculography revealed preserved LV contractility. Ejection fraction is 55%. There was no evidence for mitral regurgitation.  Assessment and Plan:  1. Symptomatically stable CAD status post DES to the RCA and PTCA to the RVM in February 2016. He will stop Brilinta when he completes the current bottle, otherwise continue remaining regimen. Recommended continued walking and outdoor activity for exercise. Follow-up arranged.   2. Hyperlipidemia, LDL well controlled and tolerating Lipitor. He will follow-up with Dr. Scotty Court later this spring.  3. Essential hypertension, blood pressure well controlled today.  Current medicines were reviewed with the patient today.   Orders Placed This Encounter  Procedures  . EKG  12-Lead    Disposition: FU with me in 6 months.   Signed, Satira Sark, MD, [facility] 08/06/2015 8:49 AM    [facility] [place], [place], [place] [address] Phone: [phone]; Fax: [phone]

## 2015-08-06 NOTE — Patient Instructions (Signed)
Your physician has recommended you make the following change in your medication:  You may stop brilinta when you finish your current supply. Your physician recommends that you schedule a follow-up appointment in: 6 months. You will receive a reminder letter in the mail in about 4 months reminding you to call and schedule your appointment. If you don't receive this letter, please contact our office.

## 2015-08-08 ENCOUNTER — Inpatient Hospital Stay (HOSPITAL_COMMUNITY): Payer: PPO

## 2015-08-08 ENCOUNTER — Inpatient Hospital Stay (HOSPITAL_COMMUNITY)
Admission: EM | Admit: 2015-08-08 | Discharge: 2015-08-13 | DRG: 581 | Disposition: A | Discharge status: Home Health | Payer: PPO | Attending: Internal Medicine | Admitting: Internal Medicine

## 2015-08-08 ENCOUNTER — Encounter (HOSPITAL_COMMUNITY): Payer: Self-pay | Admitting: *Deleted

## 2015-08-08 DIAGNOSIS — Z7901 Long term (current) use of anticoagulants: Secondary | ICD-10-CM | POA: Diagnosis not present

## 2015-08-08 DIAGNOSIS — D72829 Elevated white blood cell count, unspecified: Secondary | ICD-10-CM | POA: Diagnosis present

## 2015-08-08 DIAGNOSIS — B9562 Methicillin resistant Staphylococcus aureus infection as the cause of diseases classified elsewhere: Secondary | ICD-10-CM | POA: Diagnosis present

## 2015-08-08 DIAGNOSIS — Z7982 Long term (current) use of aspirin: Secondary | ICD-10-CM

## 2015-08-08 DIAGNOSIS — I252 Old myocardial infarction: Secondary | ICD-10-CM

## 2015-08-08 DIAGNOSIS — I2510 Atherosclerotic heart disease of native coronary artery without angina pectoris: Secondary | ICD-10-CM | POA: Diagnosis not present

## 2015-08-08 DIAGNOSIS — Z87891 Personal history of nicotine dependence: Secondary | ICD-10-CM

## 2015-08-08 DIAGNOSIS — L0390 Cellulitis, unspecified: Secondary | ICD-10-CM

## 2015-08-08 DIAGNOSIS — L0291 Cutaneous abscess, unspecified: Secondary | ICD-10-CM | POA: Diagnosis present

## 2015-08-08 DIAGNOSIS — N183 Chronic kidney disease, stage 3 unspecified: Secondary | ICD-10-CM | POA: Diagnosis present

## 2015-08-08 DIAGNOSIS — L03311 Cellulitis of abdominal wall: Secondary | ICD-10-CM | POA: Diagnosis not present

## 2015-08-08 DIAGNOSIS — Z87442 Personal history of urinary calculi: Secondary | ICD-10-CM

## 2015-08-08 DIAGNOSIS — E785 Hyperlipidemia, unspecified: Secondary | ICD-10-CM | POA: Diagnosis present

## 2015-08-08 DIAGNOSIS — Z8249 Family history of ischemic heart disease and other diseases of the circulatory system: Secondary | ICD-10-CM | POA: Diagnosis not present

## 2015-08-08 DIAGNOSIS — L02211 Cutaneous abscess of abdominal wall: Principal | ICD-10-CM | POA: Diagnosis present

## 2015-08-08 DIAGNOSIS — I129 Hypertensive chronic kidney disease with stage 1 through stage 4 chronic kidney disease, or unspecified chronic kidney disease: Secondary | ICD-10-CM | POA: Diagnosis not present

## 2015-08-08 DIAGNOSIS — R1031 Right lower quadrant pain: Secondary | ICD-10-CM | POA: Diagnosis not present

## 2015-08-08 DIAGNOSIS — I10 Essential (primary) hypertension: Secondary | ICD-10-CM

## 2015-08-08 DIAGNOSIS — Z955 Presence of coronary angioplasty implant and graft: Secondary | ICD-10-CM

## 2015-08-08 HISTORY — DX: Cutaneous abscess, unspecified: L02.91

## 2015-08-08 LAB — CBC WITH DIFFERENTIAL/PLATELET
BASOS ABS: 0 10*3/uL (ref 0.0–0.1)
BASOS PCT: 0 %
Eosinophils Absolute: 0.1 10*3/uL (ref 0.0–0.7)
Eosinophils Relative: 1 %
HEMATOCRIT: 34 % — AB (ref 39.0–52.0)
Hemoglobin: 11.4 g/dL — ABNORMAL LOW (ref 13.0–17.0)
Lymphocytes Relative: 10 %
Lymphs Abs: 1.7 10*3/uL (ref 0.7–4.0)
MCH: 30.7 pg (ref 26.0–34.0)
MCHC: 33.5 g/dL (ref 30.0–36.0)
MCV: 91.6 fL (ref 78.0–100.0)
MONO ABS: 1.5 10*3/uL — AB (ref 0.1–1.0)
Monocytes Relative: 9 %
NEUTROS ABS: 13.3 10*3/uL — AB (ref 1.7–7.7)
Neutrophils Relative %: 80 %
Platelets: 305 10*3/uL (ref 150–400)
RBC: 3.71 MIL/uL — AB (ref 4.22–5.81)
RDW: 12.9 % (ref 11.5–15.5)
WBC: 16.6 10*3/uL — AB (ref 4.0–10.5)

## 2015-08-08 LAB — BASIC METABOLIC PANEL
Anion gap: 9 (ref 5–15)
BUN: 30 mg/dL — ABNORMAL HIGH (ref 6–20)
CALCIUM: 8.7 mg/dL — AB (ref 8.9–10.3)
CO2: 19 mmol/L — AB (ref 22–32)
Chloride: 107 mmol/L (ref 101–111)
Creatinine, Ser: 1.52 mg/dL — ABNORMAL HIGH (ref 0.61–1.24)
GFR calc non Af Amer: 46 mL/min — ABNORMAL LOW (ref >60)
GFR, EST AFRICAN AMERICAN: 54 mL/min — AB (ref >60)
Glucose, Bld: 113 mg/dL — ABNORMAL HIGH (ref 65–99)
Potassium: 3.7 mmol/L (ref 3.5–5.1)
Sodium: 135 mmol/L (ref 135–145)

## 2015-08-08 MED ORDER — TICAGRELOR 90 MG PO TABS
ORAL_TABLET | ORAL | Status: AC
  Filled 2015-08-08: qty 1

## 2015-08-08 MED ORDER — SODIUM CHLORIDE 0.9% FLUSH: 3.0000 mL | INTRAVENOUS | Status: DC | PRN

## 2015-08-08 MED ORDER — METOPROLOL TARTRATE 25 MG PO TABS
12.5000 mg | ORAL_TABLET | Freq: Two times a day (BID) | ORAL | Status: DC
  Administered 2015-08-09 – 2015-08-13 (×10): 12.5 mg via ORAL
  Filled 2015-08-08 (×6): qty 1
  Filled 2015-08-08: qty 0.5
  Filled 2015-08-08 (×3): qty 1

## 2015-08-08 MED ORDER — LACTATED RINGERS IV SOLN
INTRAVENOUS | Status: DC
  Administered 2015-08-08 – 2015-08-12 (×8): via INTRAVENOUS

## 2015-08-08 MED ORDER — SODIUM CHLORIDE 0.9% FLUSH
3.0000 mL | Freq: Two times a day (BID) | INTRAVENOUS | Status: DC
  Administered 2015-08-08 – 2015-08-12 (×4): 3 mL via INTRAVENOUS

## 2015-08-08 MED ORDER — ONDANSETRON HCL 4 MG/2ML IJ SOLN
4.0000 mg | Freq: Four times a day (QID) | INTRAMUSCULAR | Status: DC | PRN
Start: 2015-08-08 — End: 2015-08-13
  Administered 2015-08-12: 4 mg via INTRAVENOUS
  Filled 2015-08-08: qty 2

## 2015-08-08 MED ORDER — ATORVASTATIN CALCIUM 40 MG PO TABS
40.0000 mg | ORAL_TABLET | Freq: Every day | ORAL | Status: DC
  Administered 2015-08-09 – 2015-08-13 (×5): 40 mg via ORAL
  Filled 2015-08-08 (×8): qty 1

## 2015-08-08 MED ORDER — ACETAMINOPHEN 500 MG PO TABS
500.0000 mg | ORAL_TABLET | Freq: Four times a day (QID) | ORAL | Status: DC | PRN
  Administered 2015-08-09 – 2015-08-12 (×2): 500 mg via ORAL
  Filled 2015-08-08 (×2): qty 1

## 2015-08-08 MED ORDER — SODIUM CHLORIDE 0.9 % IV SOLN
1250.0000 mg | INTRAVENOUS | Status: DC
  Administered 2015-08-09 – 2015-08-12 (×3): 1250 mg via INTRAVENOUS
  Filled 2015-08-08 (×4): qty 1250

## 2015-08-08 MED ORDER — TICAGRELOR 90 MG PO TABS
90.0000 mg | ORAL_TABLET | Freq: Two times a day (BID) | ORAL | Status: DC
  Administered 2015-08-09: 90 mg via ORAL
  Filled 2015-08-08: qty 1

## 2015-08-08 MED ORDER — PIPERACILLIN-TAZOBACTAM 3.375 G IVPB
3.3750 g | Freq: Three times a day (TID) | INTRAVENOUS | Status: DC
  Administered 2015-08-09 – 2015-08-12 (×10): 3.375 g via INTRAVENOUS
  Filled 2015-08-08 (×16): qty 50

## 2015-08-08 MED ORDER — ONDANSETRON HCL 4 MG PO TABS: 4.0000 mg | ORAL_TABLET | Freq: Four times a day (QID) | ORAL | Status: DC | PRN

## 2015-08-08 MED ORDER — SODIUM CHLORIDE 0.9 % IV SOLN: 250.0000 mL | INTRAVENOUS | Status: DC | PRN

## 2015-08-08 MED ORDER — VANCOMYCIN HCL 10 G IV SOLR
1500.0000 mg | Freq: Once | INTRAVENOUS | Status: AC
  Administered 2015-08-08: 1500 mg via INTRAVENOUS
  Filled 2015-08-08: qty 1500

## 2015-08-08 NOTE — ED Provider Notes (Signed)
CSN: [id number]     Arrival date & time 08/08/15  1910 History  By signing my name below, I, Hansel Feinstein, attest that this documentation has been prepared under the direction and in the presence of Nat Christen, MD. Electronically Signed: Hansel Feinstein, ED Scribe. 08/08/2015. 9:13 PM.    Chief Complaint  Patient presents with  . Abscess   The history is provided by the patient. No language interpreter was used.    HPI Comments: Charles Brooks is a 66 y.o. male with h/o STEMI, CAD, CKD, HTN, HLD who presents to the Emergency Department complaining of a moderate, gradually worsening area of pain, erythema and swelling to the right lower lateral abdomen onset 2 weeks ago. Pt notes that the area began very small and gradually increased in size. He states the area has been draining. He also reports a decreased appetite. No h/o DM. PCP is Dr. Scotty Court in [place]. He denies fever.   Past Medical History  Diagnosis Date  . Arthritis   . STEMI (ST elevation myocardial infarction) ([place])     07/2014  . CAD (coronary artery disease), native coronary artery     DES RCA and PTCA RVM 07/2014, LVEF 55%  . CKD (chronic kidney disease) stage 3, GFR 30-59 ml/min   . Hypertension   . Hyperlipidemia    Past Surgical History  Procedure Laterality Date  . Cholecystectomy    . Removal of kidney stones      Open  . Knee arthroscopy Right   . Extracorporeal shock wave lithotripsy    . Colonoscopy with propofol N/A 11/08/2013    Procedure: COLONOSCOPY WITH PROPOFOL;  Surgeon: [name], MD;  Location: [facility];  Service: General;  Laterality: N/A;  in cecum at 0741; cecal withdrawal time = 10 min  . Polypectomy N/A 11/08/2013    Procedure: POLYPECTOMY;  Surgeon: [name], MD;  Location: [facility];  Service: General;  Laterality: N/A;  cecal polyp  . Left heart catheterization with coronary angiogram N/A 08/14/2014    Procedure: LEFT HEART CATHETERIZATION WITH CORONARY ANGIOGRAM;  Surgeon: Troy Sine, MD;  LAD  50-60%, CFX OK, OM1 80%(small), RCA 100>>0% w/ 2.25x24 mm Synergy DES, EF 55%   Family History  Problem Relation Age of Onset  . Heart attack Brother    Social History  Substance Use Topics  . Smoking status: Former Smoker -- 20 years    Types: Cigars    Quit date: 11/05/2003  . Smokeless tobacco: Never Used  . Alcohol Use: No    Review of Systems A complete 10 system review of systems was obtained and all systems are negative except as noted in the HPI and PMH.    Allergies  Codeine  Home Medications   Prior to Admission medications   Medication Sig Start Date End Date Taking? Authorizing Provider  acetaminophen (TYLENOL) 500 MG tablet Take 500 mg by mouth every 6 (six) hours as needed for mild pain or moderate pain.   Yes Historical Provider, MD  aspirin EC 81 MG EC tablet Take 1 tablet (81 mg total) by mouth daily. 08/16/14  Yes Rhonda G Barrett, PA-C  atorvastatin (LIPITOR) 40 MG tablet Take 1 tablet (40 mg total) by mouth daily. 11/06/14  Yes Satira Sark, MD  guaiFENesin (ROBITUSSIN) 100 MG/5ML SOLN Take 5 mLs by mouth every 4 (four) hours as needed for cough or to loosen phlegm.   Yes Historical Provider, MD  metoprolol tartrate (LOPRESSOR) 25 MG tablet  Take 0.5 tablets (12.5 mg total) by mouth 2 (two) times daily. 08/16/14  Yes Rhonda G Barrett, PA-C  nitroGLYCERIN (NITROSTAT) 0.4 MG SL tablet Place 1 tablet (0.4 mg total) under the tongue every 5 (five) minutes x 3 doses as needed for chest pain. 08/16/14  Yes Rhonda G Barrett, PA-C  Phenylephrine-DM-GG-APAP (TYLENOL COLD/FLU SEVERE) 5-10-200-325 MG TABS Take 1-2 tablets by mouth daily as needed (for cold symptoms).   Yes Historical Provider, MD  ticagrelor (BRILINTA) 90 MG TABS tablet Take 1 tablet (90 mg total) by mouth 2 (two) times daily. 08/16/14  Yes Rhonda G Barrett, PA-C   BP 112/68 mmHg  Pulse 75  Temp(Src) 99.3 F (37.4 C) (Oral)  Resp 20  Ht 6' (1.829 m)  Wt 172 lb (78.019 kg)  BMI 23.32 kg/m2  SpO2  99% Physical Exam  Constitutional: He is oriented to person, place, and time. He appears well-developed and well-nourished.  HENT:  Head: Normocephalic and atraumatic.  Eyes: Conjunctivae and EOM are normal. Pupils are equal, round, and reactive to light.  Neck: Normal range of motion. Neck supple.  Cardiovascular: Normal rate and regular rhythm.   Pulmonary/Chest: Effort normal and breath sounds normal.  Abdominal: Soft. Bowel sounds are normal.  Right lateral abdomen and right flank: there is an area of erythema, edema and induration approximately 10 x 30 cm.   Musculoskeletal: Normal range of motion.  Neurological: He is alert and oriented to person, place, and time.  Skin: Skin is warm and dry.  Psychiatric: He has a normal mood and affect. His behavior is normal.  Nursing note and vitals reviewed.   ED Course  Procedures (including critical care time) DIAGNOSTIC STUDIES: Oxygen Saturation is 100% on RA, normal by my interpretation.    COORDINATION OF CARE: 9:12 PM Discussed treatment plan with pt at bedside which includes blood work, IV Vancomycin and pt agreed to plan.   Labs Review Labs Reviewed  CBC WITH DIFFERENTIAL/PLATELET - Abnormal; Notable for the following:    WBC 16.6 (*)    RBC 3.71 (*)    Hemoglobin 11.4 (*)    HCT 34.0 (*)    Neutro Abs 13.3 (*)    Monocytes Absolute 1.5 (*)    All other components within normal limits  BASIC METABOLIC PANEL - Abnormal; Notable for the following:    CO2 19 (*)    Glucose, Bld 113 (*)    BUN 30 (*)    Creatinine, Ser 1.52 (*)    Calcium 8.7 (*)    GFR calc non Af Amer 46 (*)    GFR calc Af Amer 54 (*)    All other components within normal limits  BASIC METABOLIC PANEL   I have personally reviewed and evaluated these lab results as part of my medical decision-making.  MDM   Final diagnoses:  Abscess    Patient has significant abscess/cellulitis on right lateral abdomen and right flank. IV vancomycin. Admit.  I  personally performed the services described in this documentation, which was scribed in my presence. The recorded information has been reviewed and is accurate.    Nat Christen, MD 08/08/15 2219

## 2015-08-08 NOTE — Progress Notes (Addendum)
ANTIBIOTIC CONSULT NOTE - INITIAL  Pharmacy Consult for Vancomycin & Zosyn Indication: cellulitis, wound infection  Allergies  Allergen Reactions  . Codeine Hives    Patient Measurements: Height: 6' (182.9 cm) Weight: 172 lb (78.019 kg) IBW/kg (Calculated) : 77.6 Adjusted Body Weight:  Vital Signs: Temp: 99.3 F (37.4 C) (02/18 1924) Temp Source: Oral (02/18 1924) BP: 112/68 mmHg (02/18 2155) Pulse Rate: 75 (02/18 2155) Intake/Output from previous day:   Intake/Output from this shift:    Labs:  Recent Labs  08/08/15 2100  WBC 16.6*  HGB 11.4*  PLT 305  CREATININE 1.52*   Estimated Creatinine Clearance: 53.2 mL/min (by C-G formula based on Cr of 1.52). No results for input(s): VANCOTROUGH, VANCOPEAK, VANCORANDOM, GENTTROUGH, GENTPEAK, GENTRANDOM, TOBRATROUGH, TOBRAPEAK, TOBRARND, AMIKACINPEAK, AMIKACINTROU, AMIKACIN in the last 72 hours.   Microbiology: No results found for this or any previous visit (from the past 720 hour(s)).  Medical History: Past Medical History  Diagnosis Date  . Arthritis   . STEMI (ST elevation myocardial infarction) ([place])     07/2014  . CAD (coronary artery disease), native coronary artery     DES RCA and PTCA RVM 07/2014, LVEF 55%  . CKD (chronic kidney disease) stage 3, GFR 30-59 ml/min   . Hypertension   . Hyperlipidemia     Medications:  Scheduled:   Assessment: 65 yo male patient, right flank area red and warm to touch. Pt complaining of boil to area Vancomycin for cellulitis Zosyn added on admission  Goal of Therapy:  Vancomycin trough level 10-15 mcg/ml  Plan:  Vancomycin 1500 mg IV loading dose, then 1250 mg IV every 24 hours Vancomycin trough at steady state Monitor renal function Zosyn 3.375 GM IV every 8 hours, each dose over 4 hours Labs per protocol  Abner Greenspan, [facility] 08/08/2015,10:14 PM

## 2015-08-08 NOTE — Progress Notes (Signed)
ANTIBIOTIC CONSULT NOTE-Preliminary  Pharmacy Consult for zosyn Indication: wound infection  Allergies  Allergen Reactions  . Codeine Hives    Patient Measurements: Height: 6' (182.9 cm) Weight: 172 lb (78.019 kg) IBW/kg (Calculated) : 77.6  Vital Signs: Temp: 99.3 F (37.4 C) (02/18 1924) Temp Source: Oral (02/18 1924) BP: 121/66 mmHg (02/18 2330) Pulse Rate: 76 (02/18 2330)  Labs:  Recent Labs  08/08/15 2100  WBC 16.6*  HGB 11.4*  PLT 305  CREATININE 1.52*    Estimated Creatinine Clearance: 53.2 mL/min (by C-G formula based on Cr of 1.52).  No results for input(s): VANCOTROUGH, VANCOPEAK, VANCORANDOM, GENTTROUGH, GENTPEAK, GENTRANDOM, TOBRATROUGH, TOBRAPEAK, TOBRARND, AMIKACINPEAK, AMIKACINTROU, AMIKACIN in the last 72 hours.   Microbiology: No results found for this or any previous visit (from the past 720 hour(s)).  Medical History: Past Medical History  Diagnosis Date  . Arthritis   . STEMI (ST elevation myocardial infarction) ([place])     07/2014  . CAD (coronary artery disease), native coronary artery     DES RCA and PTCA RVM 07/2014, LVEF 55%  . CKD (chronic kidney disease) stage 3, GFR 30-59 ml/min   . Hypertension   . Hyperlipidemia     Medications:  Scheduled:  . atorvastatin  40 mg Oral Daily  . metoprolol tartrate  12.5 mg Oral BID  . sodium chloride flush  3 mL Intravenous Q12H  . ticagrelor  90 mg Oral BID    Assessment: 65 yo male admitted for IV abx for large abscess to right flank area with spontaneous drainage of pus.   Goal of Therapy:  eradication of infection  Plan:  Preliminary review of pertinent patient information completed.  Protocol will be initiated with a one-time dose(s) of zosyn 3.375 grams IV Q 8 hours. [place] clinical pharmacist will complete review during morning rounds to assess patient and finalize treatment regimen.  Nyra Capes, RPH 08/08/2015,11:48 PM

## 2015-08-08 NOTE — H&P (Signed)
PCP:   TAPPER,Jeanclaude Wentworth B, MD   Chief Complaint:  Redness and pus draining from right flank  HPI: 65 yo male h/o CAD comes in with 2 weeks of progressive worsening redness to right flank area, it started draining pus several days ago.  He denies fevers or chills.  He says it does not hurt unless someone touches it.  When asked why it took him so long to see a doctor he says he "is not a doctor person".  Denies any n/v/d.  Pt has large absess to right flank area with spontaneous drainage of pus.  Referred for admission for iv abx.  Review of Systems:  Positive and negative as per HPI otherwise all other systems are negative  Past Medical History: Past Medical History  Diagnosis Date  . Arthritis   . STEMI (ST elevation myocardial infarction) ([place])     07/2014  . CAD (coronary artery disease), native coronary artery     DES RCA and PTCA RVM 07/2014, LVEF 55%  . CKD (chronic kidney disease) stage 3, GFR 30-59 ml/min   . Hypertension   . Hyperlipidemia    Past Surgical History  Procedure Laterality Date  . Cholecystectomy    . Removal of kidney stones      Open  . Knee arthroscopy Right   . Extracorporeal shock wave lithotripsy    . Colonoscopy with propofol N/A 11/08/2013    Procedure: COLONOSCOPY WITH PROPOFOL;  Surgeon: [name], MD;  Location: [facility];  Service: General;  Laterality: N/A;  in cecum at 0741; cecal withdrawal time = 10 min  . Polypectomy N/A 11/08/2013    Procedure: POLYPECTOMY;  Surgeon: [name], MD;  Location: [facility];  Service: General;  Laterality: N/A;  cecal polyp  . Left heart catheterization with coronary angiogram N/A 08/14/2014    Procedure: LEFT HEART CATHETERIZATION WITH CORONARY ANGIOGRAM;  Surgeon: Troy Sine, MD;  LAD 50-60%, CFX OK, OM1 80%(small), RCA 100>>0% w/ 2.25x24 mm Synergy DES, EF 55%    Medications: Prior to Admission medications   Medication Sig Start Date End Date Taking? Authorizing Provider  acetaminophen (TYLENOL) 500 MG  tablet Take 500 mg by mouth every 6 (six) hours as needed for mild pain or moderate pain.   Yes Historical Provider, MD  aspirin EC 81 MG EC tablet Take 1 tablet (81 mg total) by mouth daily. 08/16/14  Yes Rhonda G Barrett, PA-C  atorvastatin (LIPITOR) 40 MG tablet Take 1 tablet (40 mg total) by mouth daily. 11/06/14  Yes Satira Sark, MD  guaiFENesin (ROBITUSSIN) 100 MG/5ML SOLN Take 5 mLs by mouth every 4 (four) hours as needed for cough or to loosen phlegm.   Yes Historical Provider, MD  metoprolol tartrate (LOPRESSOR) 25 MG tablet Take 0.5 tablets (12.5 mg total) by mouth 2 (two) times daily. 08/16/14  Yes Rhonda G Barrett, PA-C  nitroGLYCERIN (NITROSTAT) 0.4 MG SL tablet Place 1 tablet (0.4 mg total) under the tongue every 5 (five) minutes x 3 doses as needed for chest pain. 08/16/14  Yes Rhonda G Barrett, PA-C  Phenylephrine-DM-GG-APAP (TYLENOL COLD/FLU SEVERE) 5-10-200-325 MG TABS Take 1-2 tablets by mouth daily as needed (for cold symptoms).   Yes Historical Provider, MD  ticagrelor (BRILINTA) 90 MG TABS tablet Take 1 tablet (90 mg total) by mouth 2 (two) times daily. 08/16/14  Yes Rhonda G Barrett, PA-C    Allergies:   Allergies  Allergen Reactions  . Codeine Hives    Social History:  reports that he quit smoking about 11 years ago. His smoking use included Cigars. He has never used smokeless tobacco. He reports that he does not drink alcohol or use illicit drugs.  Family History: Family History  Problem Relation Age of Onset  . Heart attack Brother     Physical Exam: Filed Vitals:   08/08/15 1924 08/08/15 2155 08/08/15 2222  BP: 147/71 112/68 116/84  Pulse: 105 75 75  Temp: 99.3 F (37.4 C)    TempSrc: Oral    Resp: 16 20 18   Height: 6' (1.829 m)    Weight: 78.019 kg (172 lb)    SpO2: 100% 99% 100%   General appearance: alert, cooperative and no distress Head: Normocephalic, without obvious abnormality, atraumatic Eyes: negative Nose: Nares normal. Septum midline.  Mucosa normal. No drainage or sinus tenderness. Neck: no JVD and supple, symmetrical, trachea midline Lungs: clear to auscultation bilaterally Heart: regular rate and rhythm, S1, S2 normal, no murmur, click, rub or gallop Abdomen: soft, non-tender; bowel sounds normal; no masses,  no organomegaly Extremities: extremities normal, atraumatic, no cyanosis or edema Pulses: 2+ and symmetric Skin: large area of erythema to right flank wraps from back to front abd with large area of induration and flunctuance at least 10cm with purulent drainage c/w cellulitis with large absess Neurologic: Grossly normal    Labs on Admission:   Recent Labs  08/08/15 2100  NA 135  K 3.7  CL 107  CO2 19*  GLUCOSE 113*  BUN 30*  CREATININE 1.52*  CALCIUM 8.7*    Recent Labs  08/08/15 2100  WBC 16.6*  NEUTROABS 13.3*  HGB 11.4*  HCT 34.0*  MCV 91.6  PLT 305    Radiological Exams on Admission: No results found.  Old chart reviewed  Assessment/Plan  65 yo male with cellulitis and large absess  Principal Problem:   Cellulitis and abscess-  Iv vanc and add zosyn.  Obtain wound culture from pus draining.  Will keep npo, and hold anticoagulants for OR tomorrow.  Consult general surgery.  Ivf.  Will order ct scan to evaluate extensiveness of this infection.  Active Problems:   CAD (coronary artery disease), native coronary artery- noted, stable   CKD (chronic kidney disease) stage 3, GFR 30-59 ml/min- noted, at baseline  Admit to med surg.  Full code.    Joanne Salah A 08/08/2015, 11:01 PM

## 2015-08-08 NOTE — ED Notes (Signed)
Pt c/o a "boil" on the right flank area that is red and warm to the touch.

## 2015-08-09 ENCOUNTER — Encounter (HOSPITAL_COMMUNITY): Payer: Self-pay | Admitting: *Deleted

## 2015-08-09 DIAGNOSIS — N183 Chronic kidney disease, stage 3 (moderate): Secondary | ICD-10-CM | POA: Diagnosis not present

## 2015-08-09 DIAGNOSIS — I2510 Atherosclerotic heart disease of native coronary artery without angina pectoris: Secondary | ICD-10-CM | POA: Diagnosis not present

## 2015-08-09 DIAGNOSIS — L0390 Cellulitis, unspecified: Secondary | ICD-10-CM | POA: Diagnosis not present

## 2015-08-09 DIAGNOSIS — L0291 Cutaneous abscess, unspecified: Secondary | ICD-10-CM | POA: Diagnosis not present

## 2015-08-09 DIAGNOSIS — L02212 Cutaneous abscess of back [any part, except buttock]: Secondary | ICD-10-CM | POA: Diagnosis not present

## 2015-08-09 LAB — CBC
HCT: 33.2 % — ABNORMAL LOW (ref 39.0–52.0)
Hemoglobin: 11 g/dL — ABNORMAL LOW (ref 13.0–17.0)
MCH: 30.6 pg (ref 26.0–34.0)
MCHC: 33.1 g/dL (ref 30.0–36.0)
MCV: 92.5 fL (ref 78.0–100.0)
PLATELETS: 283 10*3/uL (ref 150–400)
RBC: 3.59 MIL/uL — ABNORMAL LOW (ref 4.22–5.81)
RDW: 12.8 % (ref 11.5–15.5)
WBC: 15.1 10*3/uL — ABNORMAL HIGH (ref 4.0–10.5)

## 2015-08-09 LAB — BASIC METABOLIC PANEL
Anion gap: 9 (ref 5–15)
BUN: 25 mg/dL — ABNORMAL HIGH (ref 6–20)
CALCIUM: 8.3 mg/dL — AB (ref 8.9–10.3)
CO2: 20 mmol/L — AB (ref 22–32)
CREATININE: 1.51 mg/dL — AB (ref 0.61–1.24)
Chloride: 107 mmol/L (ref 101–111)
GFR calc non Af Amer: 47 mL/min — ABNORMAL LOW (ref >60)
GFR, EST AFRICAN AMERICAN: 54 mL/min — AB (ref >60)
GLUCOSE: 98 mg/dL (ref 65–99)
Potassium: 4.4 mmol/L (ref 3.5–5.1)
Sodium: 136 mmol/L (ref 135–145)

## 2015-08-09 NOTE — Progress Notes (Signed)
[facility] PROGRESS NOTE  [name] [id number] DOB: 1950-08-23 DOA: 08/08/2015 PCP: Deloria Lair, MD  Assessment/Plan: 1. Right flank cellulitis/abscess. -Charles Brooks reporting noticing a "boil" in his right flank area several weeks. He presents with erythema and induration along with purulent discharge. Symptoms concerning for abscess/cellulitis -Gen. surgery was consulted, case was discussed with Dr. Redmond Pulling -Plan to make amend by mouth after midnight will likely undergo incision and drainage of abscess tomorrow morning. -Continue empiric IV antibiotic therapy with IV vancomycin and Zosyn. -Pending cultures.  2.  History of coronary artery disease -History of ST segment elevation myocardial infarction in February 2016, status post percutaneous intervention with drug-eluting stent to RCA and PTCA -He last saw his cardiologist on 08/06/2015 where they discussed coming off of Brilinta once completing the current bottle. -He does not appear to have acute cardiac issues at this time. He denies chest pain. -Continue aspirin, Lipitor, metoprolol.  3.  Hypertension. -Blood pressure stable -Continue metoprolol 12.5 mg by mouth twice a day  4.  Stage III chronic kidney disease -Lab work showing creatinine 1.5, which is near his baseline.  Code Status: Full code Family Communication:  Disposition Plan: Probable incision and drainage tomorrow   Consultants:  General surgery  Antibiotics:  IV vancomycin  IV Zosyn  HPI/Subjective: Charles Brooks is a 65 year old gentleman with a past medical history of coronary artery disease, that is post drug-eluting stent placement in February 2016, chronic kidney disease, admitted to the medicine service on 08/08/2015 when he presented with complaints of erythema involving the right flank region. She reported noticing a boil several weeks ago with the area becoming progressively erythematous and painful. He did not seek medical attention  as symptoms worsened. He developed purulent discharge from the area and came to the ER to be seen. Findings on exam consistent with [place]'s/abscess for which he was started on IV vancomycin and Zosyn. Gen. surgery was consulted.  Objective: Filed Vitals:   08/09/15 0600 08/09/15 0916  BP: 105/75 139/68  Pulse: 74 69  Temp: 98.6 F (37 C) 98.4 F (36.9 C)  Resp: 16 16    Intake/Output Summary (Last 24 hours) at 08/09/15 1201 Last data filed at 08/09/15 0510  Gross per 24 hour  Intake      0 ml  Output    350 ml  Net   -350 ml   Filed Weights   08/08/15 1924  Weight: 78.019 kg (172 lb)    Exam:   General:  He is nontoxic appearing and in no acute distress  Cardiovascular: Regular rate and rhythm normal S1-S2 no murmurs or gallops  Respiratory: Clear to auscultation bilaterally no wheezing rhonchi rales  Abdomen: Soft nontender nondistended  Musculoskeletal: He has extensive erythema involving right flank with region of induration. There is purulent discharge associated  Data Reviewed: Basic Metabolic Panel:  Recent Labs Lab 08/08/15 2100 08/09/15 0623  NA 135 136  K 3.7 4.4  CL 107 107  CO2 19* 20*  GLUCOSE 113* 98  BUN 30* 25*  CREATININE 1.52* 1.51*  CALCIUM 8.7* 8.3*   Liver Function Tests: No results for input(s): AST, ALT, ALKPHOS, BILITOT, PROT, ALBUMIN in the last 168 hours. No results for input(s): LIPASE, AMYLASE in the last 168 hours. No results for input(s): AMMONIA in the last 168 hours. CBC:  Recent Labs Lab 08/08/15 2100 08/09/15 0623  WBC 16.6* 15.1*  NEUTROABS 13.3*  --   HGB 11.4* 11.0*  HCT 34.0* 33.2*  MCV 91.6 92.5  PLT 305 283   Cardiac Enzymes: No results for input(s): CKTOTAL, CKMB, CKMBINDEX, TROPONINI in the last 168 hours. BNP (last 3 results)  Recent Labs  08/14/14 0730  BNP 30.2    ProBNP (last 3 results) No results for input(s): PROBNP in the last 8760 hours.  CBG: No results for input(s): GLUCAP in the  last 168 hours.  No results found for this or any previous visit (from the past 240 hour(s)).   Studies: Ct Abdomen Pelvis Wo Contrast  08/09/2015  CLINICAL DATA:  65 year old male with worsening redness in the right flank area. Started draining cause several days ago. EXAM: CT ABDOMEN AND PELVIS WITHOUT CONTRAST TECHNIQUE: Multidetector CT imaging of the abdomen and pelvis was performed following the standard protocol without IV contrast. COMPARISON:  CT dated 08/19/2013 FINDINGS: Evaluation of this exam is limited in the absence of intravenous contrast. The visualized lung bases are clear. There is coronary vascular calcification. No intra-abdominal free air or free fluid. Cholecystectomy. Subcentimeter right hepatic hypodense focus is not characterized on the CT. The liver otherwise appears unremarkable. The pancreas, spleen, adrenal glands appear unremarkable. There are bilateral nonobstructing renal calculi measuring up to 11 mm on the right and 7 mm on the left. Bilateral exophytic, parenchymal and parapelvic hypodensities measuring up to 2 cm in the inferior pole of the left kidney appear grossly stable compared to prior study. These lesions are not well characterized but may represent cysts. Ultrasound may provide better evaluation. There is no hydronephrosis on either side. The visualized ureters and urinary bladder appear unremarkable. The prostate and seminal vesicles are grossly unremarkable. Moderate stool noted throughout the colon. There are sigmoid diverticulosis and scattered colonic diverticula without active inflammatory changes. There is no evidence of bowel obstruction or inflammation. Normal appendix. Moderate aortoiliac atherosclerotic disease. The abdominal aorta and IVC appear otherwise grossly unremarkable on this noncontrast study. No portal venous gas identified. There is no adenopathy. There is a small umbilical and left paraumbilical fat containing umbilical hernia. There is no  inflammatory changes of the herniated fat. There is inflammatory changes and stranding of the subcutaneous soft tissues of the right flank and gluteal region. Confluent areas of higher attenuation in the subcutaneous soft tissues of the right flank most compatible with hematoma. No drainable fluid collection identified. There is degenerative changes of the spine. No acute fracture. IMPRESSION: Right flank subcutaneous hematoma. No drainable fluid collection or abscess. Nonobstructing bilateral renal calculi. No hydronephrosis. Incompletely characterized bilateral renal hypodensities, likely cysts. Ultrasound may provide better evaluation. Diverticulosis. No evidence of bowel obstruction or inflammation. Normal appendix. Electronically Signed   By: Anner Crete M.D.   On: 08/09/2015 00:08    Scheduled Meds: . [MAR Hold] atorvastatin  40 mg Oral Daily  . [MAR Hold] metoprolol tartrate  12.5 mg Oral BID  . [MAR Hold] piperacillin-tazobactam (ZOSYN)  IV  3.375 g Intravenous Q8H  . [MAR Hold] sodium chloride flush  3 mL Intravenous Q12H  . [MAR Hold] ticagrelor  90 mg Oral BID  . [MAR Hold] vancomycin  1,250 mg Intravenous Q24H   Continuous Infusions: . lactated ringers 75 mL/hr at 08/09/15 1035    Principal Problem:   Cellulitis and abscess Active Problems:   CAD (coronary artery disease), native coronary artery   CKD (chronic kidney disease) stage 3, GFR 30-59 ml/min    Time spent: 35 minutes    Kelvin Cellar  [facility] Pager [phone]. If 7PM-7AM, please contact night-coverage at [email or web address], password [facility] 08/09/2015, 12:01  PM  LOS: 1 day

## 2015-08-09 NOTE — Progress Notes (Signed)
Report called to Gershon Crane, RN. Pt transferred via wheelchair by P.Bailey in stable condition to room 202 accompanied by pt's wife.

## 2015-08-09 NOTE — Progress Notes (Signed)
SCD's applied to bilateral lower legs.

## 2015-08-09 NOTE — Consult Note (Signed)
Reason for Consult:flank abscess/cellulitis Referring Physician: Dr Earley Favor is an 65 y.o. male.  HPI: 65 yo with CAD, HTN, HPL, CKD presented to ED yesterday with worsening right flank/lower back pain/infection. His wife reports that he initially had a pimple in that area about 2 weeks ago that he scratched the head off of and would get her to occasionally squeeze it. Over time, the opening closed up and the redness progressed and could not tolerate squeezing it anymore bc it was too uncomfortable. Came to ed for worsening pain, redness, and increased drainage. Saw cardiologist on Thursday and was told he could stop oral anticoagulant when bottle ran out. He is >32yrout from PTCA with drug eluting stent. He denies DM and other soft tissue infections. Got a dose of blood thinner at 0030 today.   Past Medical History  Diagnosis Date  . Arthritis   [place]     [place]  . CAD (coronary artery disease), native coronary artery     DES RCA and PTCA RVM 07/2014, LVEF 55%  . CKD (chronic kidney disease) stage 3, GFR 30-59 ml/min   . Hypertension   . Hyperlipidemia     Past Surgical History  Procedure Laterality Date  . Cholecystectomy    . Removal of kidney stones      Open  . Knee arthroscopy Right   . Extracorporeal shock wave lithotripsy    . Colonoscopy with propofol N/A 11/08/2013    Procedure: COLONOSCOPY WITH PROPOFOL;  Surgeon: MJamesetta So MD;  Location: [facility];  Service: General;  Laterality: N/A;  in cecum at 0741; cecal withdrawal time = 10 min  . Polypectomy N/A 11/08/2013    Procedure: POLYPECTOMY;  Surgeon: MJamesetta So MD;  Location: [facility];  Service: General;  Laterality: N/A;  cecal polyp  . Left heart catheterization with coronary angiogram N/A 08/14/2014    Procedure: LEFT HEART CATHETERIZATION WITH CORONARY ANGIOGRAM;  Surgeon: TTroy Sine MD;  LAD 50-60%, CFX OK, OM1 80%(small), RCA 100>>0% w/ 2.25x24 mm  Synergy DES, EF 55%    Family History  Problem Relation Age of Onset  . Heart attack Brother     Social History:  reports that he quit smoking about 11 years ago. His smoking use included Cigars. He has never used smokeless tobacco. He reports that he does not drink alcohol or use illicit drugs.  Allergies:  Allergies  Allergen Reactions  . Codeine Hives    Medications: I have reviewed the patient's current medications.  Results for orders placed or performed during the hospital encounter of 08/08/15 (from the past 48 hour(s))  CBC with Differential     Status: Abnormal   Collection Time: 08/08/15  9:00 PM  Result Value Ref Range   WBC 16.6 (H) 4.0 - 10.5 K/uL   RBC 3.71 (L) 4.22 - 5.81 MIL/uL   Hemoglobin 11.4 (L) 13.0 - 17.0 g/dL   HCT 34.0 (L) 39.0 - 52.0 %   MCV 91.6 78.0 - 100.0 fL   MCH 30.7 26.0 - 34.0 pg   MCHC 33.5 30.0 - 36.0 g/dL   RDW 12.9 11.5 - 15.5 %   Platelets 305 150 - 400 K/uL   Neutrophils Relative % 80 %   Neutro Abs 13.3 (H) 1.7 - 7.7 K/uL   Lymphocytes Relative 10 %   Lymphs Abs 1.7 0.7 - 4.0 K/uL   Monocytes Relative 9 %   Monocytes Absolute 1.5 (H)  0.1 - 1.0 K/uL   Eosinophils Relative 1 %   Eosinophils Absolute 0.1 0.0 - 0.7 K/uL   Basophils Relative 0 %   Basophils Absolute 0.0 0.0 - 0.1 K/uL  Basic metabolic panel     Status: Abnormal   Collection Time: 08/08/15  9:00 PM  Result Value Ref Range   Sodium 135 135 - 145 mmol/L   Potassium 3.7 3.5 - 5.1 mmol/L   Chloride 107 101 - 111 mmol/L   CO2 19 (L) 22 - 32 mmol/L   Glucose, Bld 113 (H) 65 - 99 mg/dL   BUN 30 (H) 6 - 20 mg/dL   Creatinine, Ser 1.52 (H) 0.61 - 1.24 mg/dL   Calcium 8.7 (L) 8.9 - 10.3 mg/dL   GFR calc non Af Amer 46 (L) >60 mL/min   GFR calc Af Amer 54 (L) >60 mL/min    Comment: (NOTE) The eGFR has been calculated using the CKD EPI equation. This calculation has not been validated in all clinical situations. eGFR's persistently <60 mL/min signify possible Chronic  Kidney Disease.    Anion gap 9 5 - 15  Basic metabolic panel     Status: Abnormal   Collection Time: 08/09/15  6:23 AM  Result Value Ref Range   Sodium 136 135 - 145 mmol/L   Potassium 4.4 3.5 - 5.1 mmol/L   Chloride 107 101 - 111 mmol/L   CO2 20 (L) 22 - 32 mmol/L   Glucose, Bld 98 65 - 99 mg/dL   BUN 25 (H) 6 - 20 mg/dL   Creatinine, Ser 1.51 (H) 0.61 - 1.24 mg/dL   Calcium 8.3 (L) 8.9 - 10.3 mg/dL   GFR calc non Af Amer 47 (L) >60 mL/min   GFR calc Af Amer 54 (L) >60 mL/min    Comment: (NOTE) The eGFR has been calculated using the CKD EPI equation. This calculation has not been validated in all clinical situations. eGFR's persistently <60 mL/min signify possible Chronic Kidney Disease.    Anion gap 9 5 - 15  CBC     Status: Abnormal   Collection Time: 08/09/15  6:23 AM  Result Value Ref Range   WBC 15.1 (H) 4.0 - 10.5 K/uL   RBC 3.59 (L) 4.22 - 5.81 MIL/uL   Hemoglobin 11.0 (L) 13.0 - 17.0 g/dL   HCT 33.2 (L) 39.0 - 52.0 %   MCV 92.5 78.0 - 100.0 fL   MCH 30.6 26.0 - 34.0 pg   MCHC 33.1 30.0 - 36.0 g/dL   RDW 12.8 11.5 - 15.5 %   Platelets 283 150 - 400 K/uL    Ct Abdomen Pelvis Wo Contrast  08/09/2015  CLINICAL DATA:  65 year old male with worsening redness in the right flank area. Started draining cause several days ago. EXAM: CT ABDOMEN AND PELVIS WITHOUT CONTRAST TECHNIQUE: Multidetector CT imaging of the abdomen and pelvis was performed following the standard protocol without IV contrast. COMPARISON:  CT dated 08/19/2013 FINDINGS: Evaluation of this exam is limited in the absence of intravenous contrast. The visualized lung bases are clear. There is coronary vascular calcification. No intra-abdominal free air or free fluid. Cholecystectomy. Subcentimeter right hepatic hypodense focus is not characterized on the CT. The liver otherwise appears unremarkable. The pancreas, spleen, adrenal glands appear unremarkable. There are bilateral nonobstructing renal calculi  measuring up to 11 mm on the right and 7 mm on the left. Bilateral exophytic, parenchymal and parapelvic hypodensities measuring up to 2 cm in the inferior pole of the  left kidney appear grossly stable compared to prior study. These lesions are not well characterized but may represent cysts. Ultrasound may provide better evaluation. There is no hydronephrosis on either side. The visualized ureters and urinary bladder appear unremarkable. The prostate and seminal vesicles are grossly unremarkable. Moderate stool noted throughout the colon. There are sigmoid diverticulosis and scattered colonic diverticula without active inflammatory changes. There is no evidence of bowel obstruction or inflammation. Normal appendix. Moderate aortoiliac atherosclerotic disease. The abdominal aorta and IVC appear otherwise grossly unremarkable on this noncontrast study. No portal venous gas identified. There is no adenopathy. There is a small umbilical and left paraumbilical fat containing umbilical hernia. There is no inflammatory changes of the herniated fat. There is inflammatory changes and stranding of the subcutaneous soft tissues of the right flank and gluteal region. Confluent areas of higher attenuation in the subcutaneous soft tissues of the right flank most compatible with hematoma. No drainable fluid collection identified. There is degenerative changes of the spine. No acute fracture. IMPRESSION: Right flank subcutaneous hematoma. No drainable fluid collection or abscess. Nonobstructing bilateral renal calculi. No hydronephrosis. Incompletely characterized bilateral renal hypodensities, likely cysts. Ultrasound may provide better evaluation. Diverticulosis. No evidence of bowel obstruction or inflammation. Normal appendix. Electronically Signed   By: Anner Crete M.D.   On: 08/09/2015 00:08    Review of Systems  Constitutional: Negative for weight loss.       Decreased appetite past couple of days  HENT: Negative  for nosebleeds.   Eyes: Negative for blurred vision.  Respiratory: Negative for shortness of breath.   Cardiovascular: Negative for chest pain, palpitations, orthopnea and PND.       Denies DOE  Gastrointestinal: Negative for nausea, vomiting and abdominal pain.  Genitourinary: Negative for dysuria and hematuria.  Musculoskeletal: Negative.   Skin: Negative for itching and rash.  Neurological: Negative for dizziness, focal weakness, seizures, loss of consciousness and headaches.       Denies TIAs, amaurosis fugax  Endo/Heme/Allergies: Does not bruise/bleed easily.  Psychiatric/Behavioral: The patient is not nervous/anxious.    Blood pressure 110/62, pulse 66, temperature 99.3 F (37.4 C), temperature source Oral, resp. rate 18, height 6' (1.829 m), weight 79 kg (174 lb 2.6 oz), SpO2 100 %. Physical Exam  Vitals reviewed. Constitutional: He is oriented to person, place, and time. He appears well-developed and well-nourished. No distress.  HENT:  Head: Normocephalic and atraumatic.  Right Ear: External ear normal.  Left Ear: External ear normal.  Eyes: Conjunctivae are normal. No scleral icterus.  Neck: Normal range of motion. Neck supple. No tracheal deviation present. No thyromegaly present.  Cardiovascular: Normal rate and normal heart sounds.   Respiratory: Effort normal and breath sounds normal. No stridor. No respiratory distress. He has no wheezes.  GI: Soft. He exhibits no distension. There is no tenderness.  Musculoskeletal: He exhibits no edema or tenderness.  Lymphadenopathy:    He has no cervical adenopathy.  Neurological: He is alert and oriented to person, place, and time. He exhibits normal muscle tone.  Skin: Skin is warm and dry. No rash noted. He is not diaphoretic. There is erythema. No pallor.  Large right lower back/flank - cellulitis, induration, and fluctuance, old surgical scar in area. See pic  Psychiatric: He has a normal mood and affect. His behavior is  normal. Judgment and thought content normal.      Assessment/Plan: Large right lower back/ flank cellulitis/abscess CAD on oral anticoagulation - brilinta HTN CKD  Cont iv abx  Area of infection outlined with marking pen Stop Brilinta Highly likely will need surgical drainage but his brilinta complicates timing. Doubt abx alone will be sufficient Daily labs  Npo except meds after midnight for possible I&D Monday by Dr Arnoldo Morale If cellulitis, wbc, temp worsens overnight may have to operate in AM. If pt stable Dr Arnoldo Morale may elect to hold off on i&d until brilinta further out of system - discussed that timing of surgery is up to Dr Ivy Lynn. Redmond Pulling, MD, FACS General, Bariatric, & Minimally Invasive Surgery [facility], [place]   [facility] M 08/09/2015, 12:44 PM

## 2015-08-09 NOTE — Progress Notes (Signed)
From ER. Awake. Talking. resp adequate/nonlabored. IV infusing without difficulty via pump at 75 ml/hr. Right flank red with edema present. drsg intact with small amt tan drainage present. Refuses to remove pants. Wants to go to sleep.Girtha Rm watch on left wrist. Denies pain. Oriented x4.

## 2015-08-09 NOTE — Progress Notes (Signed)
Dr Ok Anis notified of consult. Pt condition discussed. Order given for diet. No surgery today for pt was given brilinta at [facility] 08/09/15 in ER. Dr Redmond Pulling will be in to see pt.

## 2015-08-10 DIAGNOSIS — N183 Chronic kidney disease, stage 3 (moderate): Secondary | ICD-10-CM | POA: Diagnosis not present

## 2015-08-10 DIAGNOSIS — I2510 Atherosclerotic heart disease of native coronary artery without angina pectoris: Secondary | ICD-10-CM | POA: Diagnosis not present

## 2015-08-10 DIAGNOSIS — L03311 Cellulitis of abdominal wall: Secondary | ICD-10-CM | POA: Diagnosis not present

## 2015-08-10 DIAGNOSIS — L0291 Cutaneous abscess, unspecified: Secondary | ICD-10-CM | POA: Diagnosis not present

## 2015-08-10 DIAGNOSIS — L0390 Cellulitis, unspecified: Secondary | ICD-10-CM | POA: Diagnosis not present

## 2015-08-10 LAB — BASIC METABOLIC PANEL
ANION GAP: 9 (ref 5–15)
BUN: 21 mg/dL — ABNORMAL HIGH (ref 6–20)
CALCIUM: 8.4 mg/dL — AB (ref 8.9–10.3)
CO2: 21 mmol/L — ABNORMAL LOW (ref 22–32)
Chloride: 108 mmol/L (ref 101–111)
Creatinine, Ser: 1.44 mg/dL — ABNORMAL HIGH (ref 0.61–1.24)
GFR, EST AFRICAN AMERICAN: 57 mL/min — AB (ref >60)
GFR, EST NON AFRICAN AMERICAN: 50 mL/min — AB (ref >60)
GLUCOSE: 105 mg/dL — AB (ref 65–99)
POTASSIUM: 4.2 mmol/L (ref 3.5–5.1)
Sodium: 138 mmol/L (ref 135–145)

## 2015-08-10 LAB — CBC
HEMATOCRIT: 34.1 % — AB (ref 39.0–52.0)
HEMOGLOBIN: 11.2 g/dL — AB (ref 13.0–17.0)
MCH: 30.6 pg (ref 26.0–34.0)
MCHC: 32.8 g/dL (ref 30.0–36.0)
MCV: 93.2 fL (ref 78.0–100.0)
Platelets: 289 10*3/uL (ref 150–400)
RBC: 3.66 MIL/uL — AB (ref 4.22–5.81)
RDW: 12.9 % (ref 11.5–15.5)
WBC: 13.8 10*3/uL — ABNORMAL HIGH (ref 4.0–10.5)

## 2015-08-10 MED ORDER — MUPIROCIN CALCIUM 2 % EX CREA
TOPICAL_CREAM | Freq: Three times a day (TID) | CUTANEOUS | Status: DC
  Administered 2015-08-10 – 2015-08-11 (×4): via TOPICAL
  Administered 2015-08-11: 1 via TOPICAL
  Administered 2015-08-11: 21:00:00 via TOPICAL
  Filled 2015-08-10: qty 15

## 2015-08-10 NOTE — Progress Notes (Signed)
[facility] PROGRESS NOTE  Charles Brooks [id number] DOB: 08/13/1950 DOA: 08/08/2015 PCP: Deloria Lair, MD  Assessment/Plan: 1. Right flank cellulitis/abscess. -[name] reporting noticing a "boil" in his right flank area several weeks. He presents with erythema and induration along with purulent discharge. Symptoms concerning for abscess/cellulitis -Gen. surgery was consulted, case was discussed with Dr. Redmond Pulling -Plan to make amend by mouth after midnight will likely undergo incision and drainage of abscess tomorrow morning. -Continue empiric IV antibiotic therapy with IV vancomycin and Zosyn. -Patient off of Brilinta.  -He had a MAXIMUM TEMPERATURE of 100.4 in the past 24 hours. Nontoxic appearing -Await general surgery's recommendations regarding timing of incision and drainage.  2.  History of coronary artery disease -History of ST segment elevation myocardial infarction in February 2016, status post percutaneous intervention with drug-eluting stent to RCA and PTCA -He last saw his cardiologist on 08/06/2015 where they discussed coming off of Brilinta once completing the current bottle. -He does not appear to have acute cardiac issues at this time. He denies chest pain. -Continue Lipitor, metoprolol.  3.  Hypertension. -Blood pressure stable -Continue metoprolol 12.5 mg by mouth twice a day  4.  Stage III chronic kidney disease -Lab work showing creatinine 1.5, which is near his baseline.  Code Status: Full code Family Communication:  I spoke to his wife Disposition Plan: Plan for surgical intervention in the upcoming days   Consultants:  General surgery  Antibiotics:  IV vancomycin  IV Zosyn  HPI/Subjective: [name] is a 65 year old gentleman with a past medical history of coronary artery disease, that is post drug-eluting stent placement in February 2016, chronic kidney disease, admitted to the medicine service on 08/08/2015 when he presented with  complaints of erythema involving the right flank region. She reported noticing a boil several weeks ago with the area becoming progressively erythematous and painful. He did not seek medical attention as symptoms worsened. He developed purulent discharge from the area and came to the ER to be seen. Findings on exam consistent with [place]'s/abscess for which he was started on IV vancomycin and Zosyn. Gen. surgery was consulted.  Objective: Filed Vitals:   08/09/15 1959 08/10/15 0640  BP: 120/60 110/71  Pulse: 72   Temp: 100.4 F (38 C) 97 F (36.1 C)  Resp: 20 20    Intake/Output Summary (Last 24 hours) at 08/10/15 0933 Last data filed at 08/10/15 [id number]  Gross per 24 hour  Intake 1367.5 ml  Output    900 ml  Net  467.5 ml   Filed Weights   08/08/15 1924 08/09/15 1215  Weight: 78.019 kg (172 lb) 79 kg (174 lb 2.6 oz)    Exam:   General:  He is nontoxic appearing and in no acute distress  Cardiovascular: Regular rate and rhythm normal S1-S2 no murmurs or gallops  Respiratory: Clear to auscultation bilaterally no wheezing rhonchi rales  Abdomen: Soft nontender nondistended  Musculoskeletal: He has extensive erythema involving right flank with region of induration. There is purulent discharge associated  Data Reviewed: Basic Metabolic Panel:  Recent Labs Lab 08/08/15 2100 08/09/15 0623 08/10/15 0511  NA 135 136 138  K 3.7 4.4 4.2  CL 107 107 108  CO2 19* 20* 21*  GLUCOSE 113* 98 105*  BUN 30* 25* 21*  CREATININE 1.52* 1.51* 1.44*  CALCIUM 8.7* 8.3* 8.4*   Liver Function Tests: No results for input(s): AST, ALT, ALKPHOS, BILITOT, PROT, ALBUMIN in the last 168 hours. No results for input(s): LIPASE, AMYLASE  in the last 168 hours. No results for input(s): AMMONIA in the last 168 hours. CBC:  Recent Labs Lab 08/08/15 2100 08/09/15 0623 08/10/15 0511  WBC 16.6* 15.1* 13.8*  NEUTROABS 13.3*  --   --   HGB 11.4* 11.0* 11.2*  HCT 34.0* 33.2* 34.1*  MCV 91.6  92.5 93.2  PLT 305 283 289   Cardiac Enzymes: No results for input(s): CKTOTAL, CKMB, CKMBINDEX, TROPONINI in the last 168 hours. BNP (last 3 results)  Recent Labs  08/14/14 0730  BNP 30.2    ProBNP (last 3 results) No results for input(s): PROBNP in the last 8760 hours.  CBG: No results for input(s): GLUCAP in the last 168 hours.  Recent Results (from the past 240 hour(s))  Wound culture     Status: None (Preliminary result)   Collection Time: 08/08/15 11:45 PM  Result Value Ref Range Status   Specimen Description ABSCESS  Final   Special Requests Normal  Final   Gram Stain   Final    ABUNDANT WBC PRESENT, PREDOMINANTLY PMN NO SQUAMOUS EPITHELIAL CELLS SEEN ABUNDANT GRAM POSITIVE COCCI IN CLUSTERS Performed at [facility]    Culture PENDING  Incomplete   Report Status PENDING  Incomplete     Studies: Ct Abdomen Pelvis Wo Contrast  08/09/2015  CLINICAL DATA:  65 year old male with worsening redness in the right flank area. Started draining cause several days ago. EXAM: CT ABDOMEN AND PELVIS WITHOUT CONTRAST TECHNIQUE: Multidetector CT imaging of the abdomen and pelvis was performed following the standard protocol without IV contrast. COMPARISON:  CT dated 08/19/2013 FINDINGS: Evaluation of this exam is limited in the absence of intravenous contrast. The visualized lung bases are clear. There is coronary vascular calcification. No intra-abdominal free air or free fluid. Cholecystectomy. Subcentimeter right hepatic hypodense focus is not characterized on the CT. The liver otherwise appears unremarkable. The pancreas, spleen, adrenal glands appear unremarkable. There are bilateral nonobstructing renal calculi measuring up to 11 mm on the right and 7 mm on the left. Bilateral exophytic, parenchymal and parapelvic hypodensities measuring up to 2 cm in the inferior pole of the left kidney appear grossly stable compared to prior study. These lesions are not well characterized  but may represent cysts. Ultrasound may provide better evaluation. There is no hydronephrosis on either side. The visualized ureters and urinary bladder appear unremarkable. The prostate and seminal vesicles are grossly unremarkable. Moderate stool noted throughout the colon. There are sigmoid diverticulosis and scattered colonic diverticula without active inflammatory changes. There is no evidence of bowel obstruction or inflammation. Normal appendix. Moderate aortoiliac atherosclerotic disease. The abdominal aorta and IVC appear otherwise grossly unremarkable on this noncontrast study. No portal venous gas identified. There is no adenopathy. There is a small umbilical and left paraumbilical fat containing umbilical hernia. There is no inflammatory changes of the herniated fat. There is inflammatory changes and stranding of the subcutaneous soft tissues of the right flank and gluteal region. Confluent areas of higher attenuation in the subcutaneous soft tissues of the right flank most compatible with hematoma. No drainable fluid collection identified. There is degenerative changes of the spine. No acute fracture. IMPRESSION: Right flank subcutaneous hematoma. No drainable fluid collection or abscess. Nonobstructing bilateral renal calculi. No hydronephrosis. Incompletely characterized bilateral renal hypodensities, likely cysts. Ultrasound may provide better evaluation. Diverticulosis. No evidence of bowel obstruction or inflammation. Normal appendix. Electronically Signed   By: Anner Crete M.D.   On: 08/09/2015 00:08    Scheduled Meds: . atorvastatin  40 mg Oral Daily  . metoprolol tartrate  12.5 mg Oral BID  . mupirocin cream   Topical TID  . piperacillin-tazobactam (ZOSYN)  IV  3.375 g Intravenous Q8H  . sodium chloride flush  3 mL Intravenous Q12H  . vancomycin  1,250 mg Intravenous Q24H   Continuous Infusions: . lactated ringers 75 mL/hr at 08/10/15 [id number]    Principal Problem:   Cellulitis  and abscess Active Problems:   CAD (coronary artery disease), native coronary artery   CKD (chronic kidney disease) stage 3, GFR 30-59 ml/min    Time spent: 25 minutes    Kelvin Cellar  [facility] Pager [phone]. If 7PM-7AM, please contact night-coverage at [email or web address], password [facility] 08/10/2015, 9:33 AM  LOS: 2 days

## 2015-08-10 NOTE — Progress Notes (Signed)
Subjective: Still with some tenderness and drainage from right flank wound. No chills noted.  Objective: Vital signs in last 24 hours: Temp:  [97 F (36.1 C)-100.4 F (38 C)] 97 F (36.1 C) (02/20 0640) Pulse Rate:  [66-72] 72 (02/19 1959) Resp:  [16-20] 20 (02/20 0640) BP: (110-139)/(60-71) 110/71 mmHg (02/20 0640) SpO2:  [97 %-100 %] 99 % (02/20 0640) Weight:  [79 kg (174 lb 2.6 oz)] 79 kg (174 lb 2.6 oz) (02/19 1215) Last BM Date: 08/09/15  Intake/Output from previous day: 02/19 0701 - 02/20 0700 In: 1367.5 [I.V.:1367.5] Out: 900 [Urine:900] Intake/Output this shift:    General appearance: alert, cooperative and no distress Skin: Right flank wound with induration and erythema present on right flank. Appears somewhat decreased from yesterday's examination.  Lab Results:   Recent Labs  08/09/15 0623 08/10/15 0511  WBC 15.1* 13.8*  HGB 11.0* 11.2*  HCT 33.2* 34.1*  PLT 283 289   BMET  Recent Labs  08/09/15 0623 08/10/15 0511  NA 136 138  K 4.4 4.2  CL 107 108  CO2 20* 21*  GLUCOSE 98 105*  BUN 25* 21*  CREATININE 1.51* 1.44*  CALCIUM 8.3* 8.4*   PT/INR No results for input(s): LABPROT, INR in the last 72 hours.  Studies/Results: Ct Abdomen Pelvis Wo Contrast  08/09/2015  CLINICAL DATA:  65 year old male with worsening redness in the right flank area. Started draining cause several days ago. EXAM: CT ABDOMEN AND PELVIS WITHOUT CONTRAST TECHNIQUE: Multidetector CT imaging of the abdomen and pelvis was performed following the standard protocol without IV contrast. COMPARISON:  CT dated 08/19/2013 FINDINGS: Evaluation of this exam is limited in the absence of intravenous contrast. The visualized lung bases are clear. There is coronary vascular calcification. No intra-abdominal free air or free fluid. Cholecystectomy. Subcentimeter right hepatic hypodense focus is not characterized on the CT. The liver otherwise appears unremarkable. The pancreas, spleen,  adrenal glands appear unremarkable. There are bilateral nonobstructing renal calculi measuring up to 11 mm on the right and 7 mm on the left. Bilateral exophytic, parenchymal and parapelvic hypodensities measuring up to 2 cm in the inferior pole of the left kidney appear grossly stable compared to prior study. These lesions are not well characterized but may represent cysts. Ultrasound may provide better evaluation. There is no hydronephrosis on either side. The visualized ureters and urinary bladder appear unremarkable. The prostate and seminal vesicles are grossly unremarkable. Moderate stool noted throughout the colon. There are sigmoid diverticulosis and scattered colonic diverticula without active inflammatory changes. There is no evidence of bowel obstruction or inflammation. Normal appendix. Moderate aortoiliac atherosclerotic disease. The abdominal aorta and IVC appear otherwise grossly unremarkable on this noncontrast study. No portal venous gas identified. There is no adenopathy. There is a small umbilical and left paraumbilical fat containing umbilical hernia. There is no inflammatory changes of the herniated fat. There is inflammatory changes and stranding of the subcutaneous soft tissues of the right flank and gluteal region. Confluent areas of higher attenuation in the subcutaneous soft tissues of the right flank most compatible with hematoma. No drainable fluid collection identified. There is degenerative changes of the spine. No acute fracture. IMPRESSION: Right flank subcutaneous hematoma. No drainable fluid collection or abscess. Nonobstructing bilateral renal calculi. No hydronephrosis. Incompletely characterized bilateral renal hypodensities, likely cysts. Ultrasound may provide better evaluation. Diverticulosis. No evidence of bowel obstruction or inflammation. Normal appendix. Electronically Signed   By: Anner Crete M.D.   On: 08/09/2015 00:08    Anti-infectives:  Anti-infectives     Start     Dose/Rate Route Frequency Ordered Stop   08/09/15 2300  vancomycin (VANCOCIN) 1,250 mg in sodium chloride 0.9 % 250 mL IVPB     1,250 mg 166.7 mL/hr over 90 Minutes Intravenous Every 24 hours 08/08/15 2213     08/09/15 0000  piperacillin-tazobactam (ZOSYN) IVPB 3.375 g     3.375 g 12.5 mL/hr over 240 Minutes Intravenous Every 8 hours 08/08/15 2347     08/08/15 2145  vancomycin (VANCOCIN) 1,500 mg in sodium chloride 0.9 % 500 mL IVPB     1,500 mg 250 mL/hr over 120 Minutes Intravenous  Once 08/08/15 2137 08/09/15 0020      Assessment/Plan: Impression: Cellulitis with draining wound, right flank, slightly improved. Leukocytosis improved. Plan: No need for acute surgical intervention. Ideally, Brilinta needs to be stopped for approximately 5 days before elective surgery. Patient understands this. We will continue to follow.   LOS: 2 days    Naina Sleeper A 08/10/2015

## 2015-08-11 ENCOUNTER — Inpatient Hospital Stay (HOSPITAL_COMMUNITY): Payer: PPO

## 2015-08-11 DIAGNOSIS — L0390 Cellulitis, unspecified: Secondary | ICD-10-CM | POA: Diagnosis not present

## 2015-08-11 DIAGNOSIS — N183 Chronic kidney disease, stage 3 (moderate): Secondary | ICD-10-CM | POA: Diagnosis not present

## 2015-08-11 DIAGNOSIS — I2510 Atherosclerotic heart disease of native coronary artery without angina pectoris: Secondary | ICD-10-CM | POA: Diagnosis not present

## 2015-08-11 DIAGNOSIS — L0291 Cutaneous abscess, unspecified: Secondary | ICD-10-CM | POA: Diagnosis not present

## 2015-08-11 LAB — BASIC METABOLIC PANEL
Anion gap: 8 (ref 5–15)
BUN: 19 mg/dL (ref 6–20)
CALCIUM: 8.3 mg/dL — AB (ref 8.9–10.3)
CO2: 22 mmol/L (ref 22–32)
Chloride: 109 mmol/L (ref 101–111)
Creatinine, Ser: 1.36 mg/dL — ABNORMAL HIGH (ref 0.61–1.24)
GFR calc Af Amer: >60 mL/min (ref >60)
GFR, EST NON AFRICAN AMERICAN: 53 mL/min — AB (ref >60)
GLUCOSE: 115 mg/dL — AB (ref 65–99)
Potassium: 4.2 mmol/L (ref 3.5–5.1)
Sodium: 139 mmol/L (ref 135–145)

## 2015-08-11 LAB — TYPE AND SCREEN
ABO/RH(D): A POS
Antibody Screen: NEGATIVE

## 2015-08-11 LAB — VANCOMYCIN, TROUGH: Vancomycin Tr: 14 ug/mL (ref 10.0–20.0)

## 2015-08-11 LAB — CBC
HEMATOCRIT: 32.4 % — AB (ref 39.0–52.0)
HEMOGLOBIN: 10.6 g/dL — AB (ref 13.0–17.0)
MCH: 30.5 pg (ref 26.0–34.0)
MCHC: 32.7 g/dL (ref 30.0–36.0)
MCV: 93.4 fL (ref 78.0–100.0)
Platelets: 273 10*3/uL (ref 150–400)
RBC: 3.47 MIL/uL — AB (ref 4.22–5.81)
RDW: 12.8 % (ref 11.5–15.5)
WBC: 13.3 10*3/uL — ABNORMAL HIGH (ref 4.0–10.5)

## 2015-08-11 LAB — MRSA PCR SCREENING: MRSA by PCR: POSITIVE — AB

## 2015-08-11 MED ORDER — CHLORHEXIDINE GLUCONATE 4 % EX LIQD
1.0000 "application " | Freq: Once | CUTANEOUS | Status: AC
  Administered 2015-08-11: 1 via TOPICAL
  Filled 2015-08-11: qty 15

## 2015-08-11 MED ORDER — SODIUM CHLORIDE 0.9 % IV SOLN
Freq: Once | INTRAVENOUS | Status: DC
Start: 2015-08-11 — End: 2015-08-13

## 2015-08-11 MED ORDER — LORAZEPAM 0.5 MG PO TABS
0.5000 mg | ORAL_TABLET | Freq: Once | ORAL | Status: AC
  Administered 2015-08-11: 0.5 mg via ORAL
  Filled 2015-08-11: qty 1

## 2015-08-11 NOTE — Progress Notes (Signed)
Patient back to bed. Dressing to right flank changed as per order

## 2015-08-11 NOTE — Care Management Note (Signed)
Case Management Note  Patient Details  Name: Charles Brooks MRN: [id number] Date of Birth: March 26, 1951  Subjective/Objective:                  Pt is from home, admitted for cellulitis/abcess. Pt is from home, lives alone and is ind with ADL's. Pt plans to return home with self care.   Action/Plan: No CM needs.   Expected Discharge Date:  08/13/15               Expected Discharge Plan:  Home/Self Care  In-House Referral:  NA  Discharge planning Services  CM Consult  Post Acute Care Choice:  NA Choice offered to:  NA  DME Arranged:    DME Agency:     HH Arranged:    HH Agency:     Status of Service:  Completed, signed off  Medicare Important Message Given:    Date Medicare IM Given:    Medicare IM give by:    Date Additional Medicare IM Given:    Additional Medicare Important Message give by:     If discussed at [place] of Stay Meetings, dates discussed:    Additional Comments:  Sherald Barge, RN 08/11/2015, 2:17 PM

## 2015-08-11 NOTE — Progress Notes (Addendum)
Dressing to right flank changed as per order. Patient up to chair at this time

## 2015-08-11 NOTE — Progress Notes (Signed)
[place] for Vancomycin & Zosyn Indication: cellulitis, wound infection  Allergies  Allergen Reactions  . Codeine Hives    Patient Measurements: Height: 6' (182.9 cm) Weight: 174 lb 2.6 oz (79 kg) IBW/kg (Calculated) : 77.6 Adjusted Body Weight:  Vital Signs: Temp: 99.1 F (37.3 C) (02/21 0556) Temp Source: Oral (02/21 0556) BP: 108/55 mmHg (02/21 0556) Pulse Rate: 63 (02/21 0556) Intake/Output from previous day: 02/20 0701 - 02/21 0700 In: 1177.3 [I.V.:877.3; IV Piggyback:300] Out: 1225 [Urine:1225] Intake/Output from this shift:    Labs:  Recent Labs  08/09/15 0623 08/10/15 0511 08/11/15 0658  WBC 15.1* 13.8* 13.3*  HGB 11.0* 11.2* 10.6*  PLT 283 289 273  CREATININE 1.51* 1.44* 1.36*   Estimated Creatinine Clearance: 59.4 mL/min (by C-G formula based on Cr of 1.36). No results for input(s): VANCOTROUGH, VANCOPEAK, VANCORANDOM, GENTTROUGH, GENTPEAK, GENTRANDOM, TOBRATROUGH, TOBRAPEAK, TOBRARND, AMIKACINPEAK, AMIKACINTROU, AMIKACIN in the last 72 hours.   Microbiology: Recent Results (from the past 720 hour(s))  Wound culture     Status: None (Preliminary result)   Collection Time: 08/08/15 11:45 PM  Result Value Ref Range Status   Specimen Description ABSCESS  Final   Special Requests Normal  Final   Gram Stain   Final    ABUNDANT WBC PRESENT, PREDOMINANTLY PMN NO SQUAMOUS EPITHELIAL CELLS SEEN ABUNDANT GRAM POSITIVE COCCI IN CLUSTERS Performed at [facility]    Culture PENDING  Incomplete   Report Status PENDING  Incomplete    Medical History: Past Medical History  Diagnosis Date  . Arthritis   . STEMI (ST elevation myocardial infarction) ([place])     07/2014  . CAD (coronary artery disease), native coronary artery     DES RCA and PTCA RVM 07/2014, LVEF 55%  . CKD (chronic kidney disease) stage 3, GFR 30-59 ml/min   . Hypertension   . Hyperlipidemia     Medications:  Scheduled:  . atorvastatin  40 mg  Oral Daily  . metoprolol tartrate  12.5 mg Oral BID  . mupirocin cream   Topical TID  . piperacillin-tazobactam (ZOSYN)  IV  3.375 g Intravenous Q8H  . sodium chloride flush  3 mL Intravenous Q12H  . vancomycin  1,250 mg Intravenous Q24H   Assessment: 65 yo male patient, right flank area red and warm to touch. Pt complaining of boil to area.  Treated with broad spectrum antibiotics.  Goal of Therapy:  Vancomycin trough level 10-15 mcg/ml  Plan:  Vancomycin 1250 mg IV every 24 hours Zosyn 3.375 GM IV every 8 hours, each dose over 4 hours F/u renal function, cultures and clinical course  Excell Seltzer Poteet 08/11/2015,8:01 AM

## 2015-08-11 NOTE — Progress Notes (Signed)
  Subjective: Patient with some tenderness over right flank cellulitis.  Objective: Vital signs in last 24 hours: Temp:  [99.1 F (37.3 C)-99.8 F (37.7 C)] 99.1 F (37.3 C) (02/21 0556) Pulse Rate:  [58-77] 63 (02/21 0556) Resp:  [18-20] 18 (02/21 0556) BP: (107-114)/(51-65) 108/55 mmHg (02/21 0556) SpO2:  [95 %-98 %] 95 % (02/21 0556) Last BM Date: 08/09/15  Intake/Output from previous day: 02/20 0701 - 02/21 0700 In: 1177.3 [I.V.:877.3; IV Piggyback:300] Out: 1225 [Urine:1225] Intake/Output this shift:    General appearance: alert, cooperative and no distress Skin: Indurated and fluctuant area in right flank with a small amount of purulent drainage present. No significant change and induration from yesterday.  Lab Results:   Recent Labs  08/10/15 0511 08/11/15 0658  WBC 13.8* 13.3*  HGB 11.2* 10.6*  HCT 34.1* 32.4*  PLT 289 273   BMET  Recent Labs  08/10/15 0511 08/11/15 0658  NA 138 139  K 4.2 4.2  CL 108 109  CO2 21* 22  GLUCOSE 105* 115*  BUN 21* 19  CREATININE 1.44* 1.36*  CALCIUM 8.4* 8.3*   PT/INR No results for input(s): LABPROT, INR in the last 72 hours.  Studies/Results: No results found.  Anti-infectives: Anti-infectives    Start     Dose/Rate Route Frequency Ordered Stop   08/09/15 2300  vancomycin (VANCOCIN) 1,250 mg in sodium chloride 0.9 % 250 mL IVPB     1,250 mg 166.7 mL/hr over 90 Minutes Intravenous Every 24 hours 08/08/15 2213     08/09/15 0000  piperacillin-tazobactam (ZOSYN) IVPB 3.375 g     3.375 g 12.5 mL/hr over 240 Minutes Intravenous Every 8 hours 08/08/15 2347     08/08/15 2145  vancomycin (VANCOCIN) 1,500 mg in sodium chloride 0.9 % 500 mL IVPB     1,500 mg 250 mL/hr over 120 Minutes Intravenous  Once 08/08/15 2137 08/09/15 0020      Assessment/Plan: Impression: Abscess, right flank Plan: We'll take patient to the OR tomorrow for incision and drainage of right flank abscess. The risks and benefits of procedure  including bleeding were fully explained to the patient, who gave informed consent.  LOS: 3 days    Wrigley Winborne A 08/11/2015

## 2015-08-11 NOTE — Progress Notes (Signed)
[facility] PROGRESS NOTE  JETTIE GOTTS [id number] DOB: January 09, 1951 DOA: 08/08/2015 PCP: Deloria Lair, MD  Interim Summary: Charles Brooks is a 65 year old gentleman with a past medical history of coronary artery disease, that is post drug-eluting stent placement in February 2016, chronic kidney disease, admitted to the medicine service on 08/08/2015 when he presented with complaints of erythema involving the right flank region. He reported noticing a boil several weeks ago with the area becoming progressively erythematous and painful. He did not seek medical attention as symptoms worsened. He developed purulent discharge from the area and came to the ER to be seen. Findings on exam consistent with [place]'s/abscess for which he was started on IV vancomycin and Zosyn. Gen. surgery was consulted.  Assessment/Plan: 1. Right flank cellulitis/abscess. -Charles Brooks reporting noticing a "boil" in his right flank area several weeks. He presents with erythema and induration along with purulent discharge. Symptoms concerning for abscess/cellulitis -Gen. surgery was consulted, case was discussed with Dr. Redmond Pulling -Plan to make amend by mouth after midnight will likely undergo incision and drainage of abscess tomorrow morning. -Continue empiric IV antibiotic therapy with IV vancomycin and Zosyn. -Patient off of Brilinta.  - Nontoxic appearing, a.m. labs showing a white count of 13,300 -Case discussed with Dr. Arnoldo Morale possibly make take to the operating room tomorrow morning  2.  History of coronary artery disease -History of ST segment elevation myocardial infarction in February 2016, status post percutaneous intervention with drug-eluting stent to RCA and PTCA -He last saw his cardiologist on 08/06/2015 where they discussed coming off of Brilinta once completing the current bottle. -He does not appear to have acute cardiac issues at this time. He denies chest pain. -Continue Lipitor,  metoprolol. -Aspirin and Brilinta held  3.  Hypertension. -Blood pressure stable -Continue metoprolol 12.5 mg by mouth twice a day  4.  Stage III chronic kidney disease -Lab work showing stable creatinine of 1.36 on 08/11/2015.  Code Status: Full code Family Communication:  I spoke to his wife Disposition Plan: Plan for surgical intervention in the upcoming days   Consultants:  General surgery  Antibiotics:  IV vancomycin  IV Zosyn  HPI/Subjective: Charles Brooks has no complaints, he reports no fevers, chills, nausea, vomiting. Tolerating by mouth intake  Objective: Filed Vitals:   08/11/15 0556 08/11/15 0846  BP: 108/55 113/60  Pulse: 63 61  Temp: 99.1 F (37.3 C)   Resp: 18     Intake/Output Summary (Last 24 hours) at 08/11/15 1007 Last data filed at 08/11/15 0848  Gross per 24 hour  Intake 1467.25 ml  Output   1425 ml  Net  42.25 ml   Filed Weights   08/08/15 1924 08/09/15 1215  Weight: 78.019 kg (172 lb) 79 kg (174 lb 2.6 oz)    Exam:   General:  He is nontoxic appearing and in no acute distress  Cardiovascular: Regular rate and rhythm normal S1-S2 no murmurs or gallops  Respiratory: Clear to auscultation bilaterally no wheezing rhonchi rales  Abdomen: Soft nontender nondistended  Musculoskeletal: He has extensive erythema involving right flank with region of induration. There is purulent discharge associated  Data Reviewed: Basic Metabolic Panel:  Recent Labs Lab 08/08/15 2100 08/09/15 0623 08/10/15 0511 08/11/15 0658  NA 135 136 138 139  K 3.7 4.4 4.2 4.2  CL 107 107 108 109  CO2 19* 20* 21* 22  GLUCOSE 113* 98 105* 115*  BUN 30* 25* 21* 19  CREATININE 1.52* 1.51* 1.44* 1.36*  CALCIUM 8.7*  8.3* 8.4* 8.3*   Liver Function Tests: No results for input(s): AST, ALT, ALKPHOS, BILITOT, PROT, ALBUMIN in the last 168 hours. No results for input(s): LIPASE, AMYLASE in the last 168 hours. No results for input(s): AMMONIA in the last 168  hours. CBC:  Recent Labs Lab 08/08/15 2100 08/09/15 0623 08/10/15 0511 08/11/15 0658  WBC 16.6* 15.1* 13.8* 13.3*  NEUTROABS 13.3*  --   --   --   HGB 11.4* 11.0* 11.2* 10.6*  HCT 34.0* 33.2* 34.1* 32.4*  MCV 91.6 92.5 93.2 93.4  PLT 305 283 289 273   Cardiac Enzymes: No results for input(s): CKTOTAL, CKMB, CKMBINDEX, TROPONINI in the last 168 hours. BNP (last 3 results)  Recent Labs  08/14/14 0730  BNP 30.2    ProBNP (last 3 results) No results for input(s): PROBNP in the last 8760 hours.  CBG: No results for input(s): GLUCAP in the last 168 hours.  Recent Results (from the past 240 hour(s))  Wound culture     Status: None (Preliminary result)   Collection Time: 08/08/15 11:45 PM  Result Value Ref Range Status   Specimen Description ABSCESS  Final   Special Requests Normal  Final   Gram Stain   Final    ABUNDANT WBC PRESENT, PREDOMINANTLY PMN NO SQUAMOUS EPITHELIAL CELLS SEEN ABUNDANT GRAM POSITIVE COCCI IN CLUSTERS Performed at [facility]    Culture PENDING  Incomplete   Report Status PENDING  Incomplete     Studies: Dg Chest 2 View  08/11/2015  CLINICAL DATA:  History of hypertension and Coronary artery disease. Also chronic renal insufficiency, former smoker. EXAM: CHEST  2 VIEW COMPARISON:  Report of a chest x-ray of Oct 24, 2001. FINDINGS: The lungs are adequately inflated. Subtle increased density projects over the posterior aspect of the right 6 rib. There are coarse lung markings in the right infrahilar region. The left lung is clear. The heart and pulmonary vascularity are normal. The mediastinum is normal in width. There is no pleural effusion or pneumothorax. The bony thorax is unremarkable. IMPRESSION: Subtle increased density in the right mid upper lobe and in the right lower lobe. On the recent CT subtle patchy density was noted in both lower lobes. Elsewhere the chest exhibits no significant abnormality. CT scanning of the chest is  recommended to exclude pulmonary nodules or masses. Electronically Signed   By: David  Martinique M.D.   On: 08/11/2015 09:21    Scheduled Meds: . sodium chloride   Intravenous Once  . atorvastatin  40 mg Oral Daily  . chlorhexidine  1 application Topical Once  . metoprolol tartrate  12.5 mg Oral BID  . mupirocin cream   Topical TID  . piperacillin-tazobactam (ZOSYN)  IV  3.375 g Intravenous Q8H  . sodium chloride flush  3 mL Intravenous Q12H  . vancomycin  1,250 mg Intravenous Q24H   Continuous Infusions: . lactated ringers 75 mL/hr at 08/10/15 1937    Principal Problem:   Cellulitis and abscess Active Problems:   CAD (coronary artery disease), native coronary artery   CKD (chronic kidney disease) stage 3, GFR 30-59 ml/min    Time spent: 25 minutes    Kelvin Cellar  [facility] Pager [phone]. If 7PM-7AM, please contact night-coverage at [email or web address], password [facility] 08/11/2015, 10:07 AM  LOS: 3 days

## 2015-08-12 ENCOUNTER — Inpatient Hospital Stay (HOSPITAL_COMMUNITY): Payer: PPO | Admitting: Anesthesiology

## 2015-08-12 ENCOUNTER — Encounter (HOSPITAL_COMMUNITY)
Admission: EM | Disposition: A | Discharge status: Home Health | Payer: Self-pay | Source: Home / Self Care | Attending: Internal Medicine

## 2015-08-12 DIAGNOSIS — L02211 Cutaneous abscess of abdominal wall: Secondary | ICD-10-CM | POA: Diagnosis not present

## 2015-08-12 DIAGNOSIS — L02212 Cutaneous abscess of back [any part, except buttock]: Secondary | ICD-10-CM | POA: Diagnosis not present

## 2015-08-12 DIAGNOSIS — L0291 Cutaneous abscess, unspecified: Secondary | ICD-10-CM

## 2015-08-12 DIAGNOSIS — L0390 Cellulitis, unspecified: Secondary | ICD-10-CM | POA: Diagnosis not present

## 2015-08-12 DIAGNOSIS — E785 Hyperlipidemia, unspecified: Secondary | ICD-10-CM | POA: Diagnosis not present

## 2015-08-12 DIAGNOSIS — N183 Chronic kidney disease, stage 3 (moderate): Secondary | ICD-10-CM

## 2015-08-12 DIAGNOSIS — I2510 Atherosclerotic heart disease of native coronary artery without angina pectoris: Secondary | ICD-10-CM | POA: Diagnosis not present

## 2015-08-12 DIAGNOSIS — L03311 Cellulitis of abdominal wall: Secondary | ICD-10-CM | POA: Diagnosis not present

## 2015-08-12 DIAGNOSIS — I129 Hypertensive chronic kidney disease with stage 1 through stage 4 chronic kidney disease, or unspecified chronic kidney disease: Secondary | ICD-10-CM | POA: Diagnosis not present

## 2015-08-12 HISTORY — PX: INCISION AND DRAINAGE ABSCESS: SHX5864

## 2015-08-12 LAB — BASIC METABOLIC PANEL
ANION GAP: 6 (ref 5–15)
BUN: 18 mg/dL (ref 6–20)
CHLORIDE: 109 mmol/L (ref 101–111)
CO2: 25 mmol/L (ref 22–32)
Calcium: 8.4 mg/dL — ABNORMAL LOW (ref 8.9–10.3)
Creatinine, Ser: 1.29 mg/dL — ABNORMAL HIGH (ref 0.61–1.24)
GFR calc non Af Amer: 57 mL/min — ABNORMAL LOW (ref >60)
Glucose, Bld: 105 mg/dL — ABNORMAL HIGH (ref 65–99)
POTASSIUM: 4.3 mmol/L (ref 3.5–5.1)
Sodium: 140 mmol/L (ref 135–145)

## 2015-08-12 LAB — CBC
HEMATOCRIT: 32.5 % — AB (ref 39.0–52.0)
HEMOGLOBIN: 10.7 g/dL — AB (ref 13.0–17.0)
MCH: 30.7 pg (ref 26.0–34.0)
MCHC: 32.9 g/dL (ref 30.0–36.0)
MCV: 93.4 fL (ref 78.0–100.0)
Platelets: 282 10*3/uL (ref 150–400)
RBC: 3.48 MIL/uL — AB (ref 4.22–5.81)
RDW: 12.8 % (ref 11.5–15.5)
WBC: 10.2 10*3/uL (ref 4.0–10.5)

## 2015-08-12 LAB — WOUND CULTURE

## 2015-08-12 SURGERY — INCISION AND DRAINAGE, ABSCESS
Anesthesia: General | Site: Flank | Laterality: Right

## 2015-08-12 MED ORDER — OXYCODONE-ACETAMINOPHEN 5-325 MG PO TABS
1.0000 | ORAL_TABLET | ORAL | Status: DC | PRN
  Administered 2015-08-12 (×2): 2 via ORAL
  Filled 2015-08-12 (×3): qty 2

## 2015-08-12 MED ORDER — CHLORHEXIDINE GLUCONATE CLOTH 2 % EX PADS
6.0000 | MEDICATED_PAD | Freq: Every day | CUTANEOUS | Status: DC
  Administered 2015-08-12: 6 via TOPICAL

## 2015-08-12 MED ORDER — MIDAZOLAM HCL 2 MG/2ML IJ SOLN
INTRAMUSCULAR | Status: AC
  Filled 2015-08-12: qty 2

## 2015-08-12 MED ORDER — VANCOMYCIN HCL 10 G IV SOLR
1500.0000 mg | INTRAVENOUS | Status: DC
  Administered 2015-08-12: 1500 mg via INTRAVENOUS
  Filled 2015-08-12 (×2): qty 1500

## 2015-08-12 MED ORDER — ONDANSETRON HCL 4 MG/2ML IJ SOLN: 4.0000 mg | Freq: Once | INTRAMUSCULAR | Status: DC

## 2015-08-12 MED ORDER — ONDANSETRON HCL 4 MG/2ML IJ SOLN: 4.0000 mg | Freq: Once | INTRAMUSCULAR | Status: DC | PRN

## 2015-08-12 MED ORDER — LACTATED RINGERS IV SOLN: INTRAVENOUS | Status: DC

## 2015-08-12 MED ORDER — EPHEDRINE SULFATE 50 MG/ML IJ SOLN
INTRAMUSCULAR | Status: DC | PRN
  Administered 2015-08-12 (×2): 5 mg via INTRAVENOUS

## 2015-08-12 MED ORDER — MIDAZOLAM HCL 2 MG/2ML IJ SOLN
1.0000 mg | INTRAMUSCULAR | Status: DC | PRN
  Administered 2015-08-12 (×2): 2 mg via INTRAVENOUS

## 2015-08-12 MED ORDER — ONDANSETRON HCL 4 MG/2ML IJ SOLN
INTRAMUSCULAR | Status: AC
  Filled 2015-08-12: qty 2

## 2015-08-12 MED ORDER — FENTANYL CITRATE (PF) 100 MCG/2ML IJ SOLN
INTRAMUSCULAR | Status: DC | PRN
  Administered 2015-08-12 (×2): 50 ug via INTRAVENOUS

## 2015-08-12 MED ORDER — SODIUM CHLORIDE 0.9 % IR SOLN
Status: DC | PRN
  Administered 2015-08-12: 3000 mL

## 2015-08-12 MED ORDER — FENTANYL CITRATE (PF) 100 MCG/2ML IJ SOLN: 25.0000 ug | INTRAMUSCULAR | Status: DC | PRN

## 2015-08-12 MED ORDER — FENTANYL CITRATE (PF) 100 MCG/2ML IJ SOLN
INTRAMUSCULAR | Status: AC
  Filled 2015-08-12: qty 2

## 2015-08-12 MED ORDER — SODIUM CHLORIDE 0.9 % IJ SOLN
INTRAMUSCULAR | Status: AC
  Filled 2015-08-12: qty 10

## 2015-08-12 MED ORDER — LIDOCAINE HCL (CARDIAC) 10 MG/ML IV SOLN
INTRAVENOUS | Status: DC | PRN
  Administered 2015-08-12: 50 mg via INTRAVENOUS

## 2015-08-12 MED ORDER — MUPIROCIN 2 % EX OINT
1.0000 "application " | TOPICAL_OINTMENT | Freq: Two times a day (BID) | CUTANEOUS | Status: DC
  Administered 2015-08-12: 1 via NASAL

## 2015-08-12 MED ORDER — HYDROMORPHONE HCL 1 MG/ML IJ SOLN: 1.0000 mg | INTRAMUSCULAR | Status: DC | PRN

## 2015-08-12 MED ORDER — LIDOCAINE HCL (PF) 1 % IJ SOLN
INTRAMUSCULAR | Status: AC
  Filled 2015-08-12: qty 5

## 2015-08-12 MED ORDER — PROPOFOL 10 MG/ML IV BOLUS
INTRAVENOUS | Status: DC | PRN
  Administered 2015-08-12: 30 mg via INTRAVENOUS
  Administered 2015-08-12: 140 mg via INTRAVENOUS

## 2015-08-12 MED ORDER — EPHEDRINE SULFATE 50 MG/ML IJ SOLN
INTRAMUSCULAR | Status: AC
  Filled 2015-08-12: qty 1

## 2015-08-12 MED ORDER — FENTANYL CITRATE (PF) 100 MCG/2ML IJ SOLN
25.0000 ug | INTRAMUSCULAR | Status: AC
  Administered 2015-08-12 (×2): 25 ug via INTRAVENOUS

## 2015-08-12 SURGICAL SUPPLY — 29 items
BAG HAMPER (MISCELLANEOUS) ×3 IMPLANT
BNDG CONFORM 2 STRL LF (GAUZE/BANDAGES/DRESSINGS) ×1 IMPLANT
CLOTH BEACON ORANGE TIMEOUT ST (SAFETY) ×3 IMPLANT
COVER LIGHT HANDLE STERIS (MISCELLANEOUS) ×6 IMPLANT
ELECT REM PT RETURN 9FT ADLT (ELECTROSURGICAL) ×3
ELECTRODE REM PT RTRN 9FT ADLT (ELECTROSURGICAL) ×1 IMPLANT
GAUZE PACKING IODOFORM 1X5 (MISCELLANEOUS) ×2 IMPLANT
GAUZE SPONGE 4X4 12PLY STRL (GAUZE/BANDAGES/DRESSINGS) IMPLANT
GLOVE BIOGEL M STRL SZ7.5 (GLOVE) ×3 IMPLANT
GLOVE BIOGEL PI IND STRL 7.0 (GLOVE) ×1 IMPLANT
GLOVE BIOGEL PI INDICATOR 7.0 (GLOVE) ×2
GLOVE SURG SS PI 7.5 STRL IVOR (GLOVE) ×3 IMPLANT
GOWN STRL REUS W/TWL LRG LVL3 (GOWN DISPOSABLE) ×6 IMPLANT
HANDPIECE INTERPULSE COAX TIP (DISPOSABLE) ×3
IV NS IRRIG 3000ML ARTHROMATIC (IV SOLUTION) ×2 IMPLANT
KIT ROOM TURNOVER APOR (KITS) ×3 IMPLANT
MANIFOLD NEPTUNE II (INSTRUMENTS) ×3 IMPLANT
MARKER SKIN DUAL TIP RULER LAB (MISCELLANEOUS) ×3 IMPLANT
NS IRRIG 1000ML POUR BTL (IV SOLUTION) ×1 IMPLANT
PACK BASIC LIMB (CUSTOM PROCEDURE TRAY) IMPLANT
PACK MINOR (CUSTOM PROCEDURE TRAY) ×3 IMPLANT
PAD ABD 5X9 TENDERSORB (GAUZE/BANDAGES/DRESSINGS) ×2 IMPLANT
PAD ARMBOARD 7.5X6 YLW CONV (MISCELLANEOUS) ×3 IMPLANT
SET BASIN LINEN APH (SET/KITS/TRAYS/PACK) ×3 IMPLANT
SET HNDPC FAN SPRY TIP SCT (DISPOSABLE) IMPLANT
SPONGE GAUZE 2X2 8PLY STER LF (GAUZE/BANDAGES/DRESSINGS) ×1
SPONGE GAUZE 2X2 8PLY STRL LF (GAUZE/BANDAGES/DRESSINGS) ×1 IMPLANT
SYR BULB IRRIGATION 50ML (SYRINGE) ×1 IMPLANT
TAPE CLOTH SURG 4X10 WHT LF (GAUZE/BANDAGES/DRESSINGS) ×2 IMPLANT

## 2015-08-12 NOTE — Progress Notes (Signed)
1300: Pt given #2 Percocet @ 1300 for c/o pain.  Wife left room once pt fell asleep. Pt was able to sleep off and on for the past several hours.  His reported pain at the time was 7/10.  At 1730, Pt was assessed and states that his pain was 5/10, but refuses offer of PRN medication.  Dressing to wound Right Flank surgical site is dry and intact.  Minimal drainage noted just beneath the dressing, but not saturated.  Patient is in stable condition.  Will continue to monitor.

## 2015-08-12 NOTE — Progress Notes (Signed)
[facility] PROGRESS NOTE  Charles Brooks [id number] DOB: 1951/02/19 DOA: 08/08/2015 PCP: Deloria Lair, MD  Assessment/Plan: 1. Right flank cellulitis/abscess. General surgery was consulted and performed an I&D of the abscess today. Wound culture from 2/18 shows MRSA Will continue Vanc. Discontinue Zosyn. Afebrile, WBC now wnl. Wound culture from 2/18  grew moderate staph aureus. 2. Hx of CAD, stable. No CP. Continue lipitor and Metoprolol. ASA and Brilinta are being held.  3. Essential HTN, stable. Continue Metoprolol. 4. CKD stage III. Creatinine appears to be at baseline.   Code Status: Full DVT prophylaxis: SCDs Family Communication: family at bedside Disposition Plan: Anticipate discharge home tomorrow on oral abx if continued improvement   Consultants:  General Surgery  Procedures:  I&D of right flank abscess 2/22 by Dr. Arnoldo Morale   Antibiotics:  Vancomycin 2/19>>  Zosyn 2/19>> 2/22  HPI/Subjective: Is experiencing pain on right side   Objective: Filed Vitals:   08/11/15 1955 08/12/15 0533  BP: 118/64 118/71  Pulse: 63 56  Temp: 99.2 F (37.3 C) 98.9 F (37.2 C)  Resp: 18 18    Intake/Output Summary (Last 24 hours) at 08/12/15 0859 Last data filed at 08/12/15 0100  Gross per 24 hour  Intake 1406.25 ml  Output   1200 ml  Net 206.25 ml   Filed Weights   08/08/15 1924 08/09/15 1215  Weight: 78.019 kg (172 lb) 79 kg (174 lb 2.6 oz)    Exam:  General: dressing noted over right flank area which was not removed. Cardiovascular: RRR, S1, S2  Respiratory: clear bilaterally, No wheezing, rales or rhonchi Abdomen: soft, non tender, no distention , bowel sounds normal Musculoskeletal: No edema b/l   Data Reviewed: Basic Metabolic Panel:  Recent Labs Lab 08/08/15 2100 08/09/15 0623 08/10/15 0511 08/11/15 0658 08/12/15 0518  NA 135 136 138 139 140  K 3.7 4.4 4.2 4.2 4.3  CL 107 107 108 109 109  CO2 19* 20* 21* 22 25  GLUCOSE 113* 98  105* 115* 105*  BUN 30* 25* 21* 19 18  CREATININE 1.52* 1.51* 1.44* 1.36* 1.29*  CALCIUM 8.7* 8.3* 8.4* 8.3* 8.4*    CBC:  Recent Labs Lab 08/08/15 2100 08/09/15 0623 08/10/15 0511 08/11/15 0658 08/12/15 0518  WBC 16.6* 15.1* 13.8* 13.3* 10.2  NEUTROABS 13.3*  --   --   --   --   HGB 11.4* 11.0* 11.2* 10.6* 10.7*  HCT 34.0* 33.2* 34.1* 32.4* 32.5*  MCV 91.6 92.5 93.2 93.4 93.4  PLT 305 283 289 273 282    BNP (last 3 results)  Recent Labs  08/14/14 0730  BNP 30.2      Recent Results (from the past 240 hour(s))  Wound culture     Status: None (Preliminary result)   Collection Time: 08/08/15 11:45 PM  Result Value Ref Range Status   Specimen Description WOUND SITE NOT SPECIFIED  Final   Special Requests NONE  Final   Gram Stain   Final    ABUNDANT WBC PRESENT, PREDOMINANTLY PMN NO SQUAMOUS EPITHELIAL CELLS SEEN ABUNDANT GRAM POSITIVE COCCI IN CLUSTERS Performed at [facility]    Culture   Final    MODERATE STAPHYLOCOCCUS AUREUS Note: RIFAMPIN AND GENTAMICIN SHOULD NOT BE USED AS SINGLE DRUGS FOR TREATMENT OF STAPH INFECTIONS. Performed at [facility]    Report Status PENDING  Incomplete  MRSA PCR Screening     Status: Abnormal   Collection Time: 08/11/15  5:30 PM  Result Value Ref Range Status  MRSA by PCR POSITIVE (A) NEGATIVE Final    Comment:        The GeneXpert MRSA Assay (FDA approved for NASAL specimens only), is one component of a comprehensive MRSA colonization surveillance program. It is not intended to diagnose MRSA infection nor to guide or monitor treatment for MRSA infections. RESULT CALLED TO, READ BACK BY AND VERIFIED WITH: HEARN,J ON 08/11/15 AT 2140 BY LOY,C      Studies: Dg Chest 2 View  08/11/2015  CLINICAL DATA:  History of hypertension and Coronary artery disease. Also chronic renal insufficiency, former smoker. EXAM: CHEST  2 VIEW COMPARISON:  Report of a chest x-ray of Oct 24, 2001. FINDINGS: The lungs are  adequately inflated. Subtle increased density projects over the posterior aspect of the right 6 rib. There are coarse lung markings in the right infrahilar region. The left lung is clear. The heart and pulmonary vascularity are normal. The mediastinum is normal in width. There is no pleural effusion or pneumothorax. The bony thorax is unremarkable. IMPRESSION: Subtle increased density in the right mid upper lobe and in the right lower lobe. On the recent CT subtle patchy density was noted in both lower lobes. Elsewhere the chest exhibits no significant abnormality. CT scanning of the chest is recommended to exclude pulmonary nodules or masses. Electronically Signed   By: David  Martinique M.D.   On: 08/11/2015 09:21    Scheduled Meds: . [MAR Hold] sodium chloride   Intravenous Once  . [MAR Hold] atorvastatin  40 mg Oral Daily  . [MAR Hold] Chlorhexidine Gluconate Cloth  6 each Topical Q0600  . [MAR Hold] metoprolol tartrate  12.5 mg Oral BID  . [MAR Hold] mupirocin cream   Topical TID  . [MAR Hold] mupirocin ointment  1 application Nasal BID  . [MAR Hold] piperacillin-tazobactam (ZOSYN)  IV  3.375 g Intravenous Q8H  . [MAR Hold] sodium chloride flush  3 mL Intravenous Q12H  . [MAR Hold] vancomycin  1,250 mg Intravenous Q24H   Continuous Infusions: . lactated ringers 75 mL/hr at 08/12/15 [id number]    Principal Problem:   Cellulitis and abscess Active Problems:   CAD (coronary artery disease), native coronary artery   CKD (chronic kidney disease) stage 3, GFR 30-59 ml/min    Time spent: 25 minutes   Jehanzeb Memon. MD  [facility] Pager [phone]. If 7PM-7AM, please contact night-coverage at [email or web address], password [facility] 08/12/2015, 8:59 AM  LOS: 4 days      By signing my name below, I, Delene Ruffini, attest that this documentation has been prepared under the direction and in the presence of Kathie Dike, MD. Electronically Signed: Delene Ruffini  08/12/2015 12:40pm  I, Dr.  Kathie Dike, personally performed the services described in this documentaiton. All medical record entries made by the scribe were at my direction and in my presence. I have reviewed the chart and agree that the record reflects my personal performance and is accurate and complete  Kathie Dike, MD, 08/12/2015 1:15 PM

## 2015-08-12 NOTE — Anesthesia Procedure Notes (Signed)
Procedure Name: LMA Insertion Date/Time: 08/12/2015 10:29 AM Performed by: Vista Deck Pre-anesthesia Checklist: Patient identified, Patient being monitored, Emergency Drugs available, Timeout performed and Suction available Patient Re-evaluated:Patient Re-evaluated prior to inductionOxygen Delivery Method: Circle System Utilized Preoxygenation: Pre-oxygenation with 100% oxygen Intubation Type: IV induction Ventilation: Mask ventilation without difficulty LMA: LMA inserted LMA Size: 5.0 Number of attempts: 1 Placement Confirmation: positive ETCO2 and breath sounds checked- equal and bilateral

## 2015-08-12 NOTE — Op Note (Signed)
Patient:  Charles Brooks  DOB:  [birth year]  MRN:  [id number]   Preop Diagnosis:  Abscess, right flank/back  Postop Diagnosis:  Same  Procedure:  Incision and drainage of abscess, right flank/back  Surgeon:  Aviva Signs, M.D.  Anes:  Gen.  Indications:  Patient is a 65 year old white male who presents with an enlarging abscess in the right flank, back area.  Patient is MRSA positive. This surgery was delayed due to being on an antiplatelet medication. The risks and benefits of the procedure including bleeding, infection, and a possibly recurrence of the abscess were fully explained to the patient, who gave informed consent.  Procedure note:  The patient was placed in the left lateral decubitus position after general anesthesia was administered. The right flank and back region were prepped and draped using the usual sterile technique with Betadine. Surgical site confirmation was performed.  An incision was made down to the subcutaneous tissue. A large abscess cavity was found. Purulent fluid was removed. Any loculated pockets were digitally broken up. Pulse lavage was then used to clean the wound. A bleeding was controlled using Bovie electrocautery. The wound was packed with iodoform new gauze. A dry sterile dressing was then applied.  All tape and needle counts were correct at the end of the procedure. The patient was awakened and transferred to PACU in stable condition.    Complications:  None  EBL:  Minimal  Specimen:  None

## 2015-08-12 NOTE — Anesthesia Postprocedure Evaluation (Signed)
Anesthesia Post Note  Patient: Charles Brooks  Procedure(s) Performed: Procedure(s) (LRB): INCISION AND DRAINAGE ABSCESS (Right)  Patient location during evaluation: PACU Level of consciousness: awake and alert Pain management: pain level controlled Vital Signs Assessment: post-procedure vital signs reviewed and stable Respiratory status: spontaneous breathing Cardiovascular status: stable Anesthetic complications: no    Last Vitals:  Filed Vitals:   08/12/15 1140 08/12/15 1155  BP:  119/68  Pulse:  60  Temp: 36.7 C 36.6 C  Resp:  17    Last Pain:  Filed Vitals:   08/12/15 1155  PainSc: 0-No pain                 Adamae Ricklefs

## 2015-08-12 NOTE — Transfer of Care (Signed)
Immediate Anesthesia Transfer of Care Note  Patient: Charles Brooks  Procedure(s) Performed: Procedure(s): INCISION AND DRAINAGE ABSCESS (Right)  Patient Location: PACU  Anesthesia Type:General  Level of Consciousness: patient cooperative  Airway & Oxygen Therapy: Patient Spontanous Breathing and non-rebreather face mask  Post-op Assessment: Report given to RN, Post -op Vital signs reviewed and stable and Patient moving all extremities  Post vital signs: Reviewed and stable   Complications: No apparent anesthesia complications

## 2015-08-12 NOTE — Anesthesia Preprocedure Evaluation (Addendum)
Anesthesia Evaluation  Patient identified by MRN, date of birth, ID band Patient awake    Reviewed: Allergy & Precautions, H&P , NPO status , Patient's Chart, lab work & pertinent test results  Airway Mallampati: III  TM Distance: >3 FB   Mouth opening: Limited Mouth Opening  Dental  (+) Dental Advisory Given, Edentulous Upper, Upper Dentures   Pulmonary neg pulmonary ROS, former smoker,    breath sounds clear to auscultation       Cardiovascular hypertension, Pt. on home beta blockers and Pt. on medications + CAD, + Past MI and + Cardiac Stents  negative cardio ROS   Rhythm:Regular Rate:Normal     Neuro/Psych    GI/Hepatic negative GI ROS,   Endo/Other    Renal/GU      Musculoskeletal   Abdominal   Peds  Hematology   Anesthesia Other Findings   Reproductive/Obstetrics                            Anesthesia Physical Anesthesia Plan  ASA: III  Anesthesia Plan: General   Post-op Pain Management:    Induction: Intravenous  Airway Management Planned: LMA  Additional Equipment:   Intra-op Plan:   Post-operative Plan: Extubation in OR  Informed Consent: I have reviewed the patients History and Physical, chart, labs and discussed the procedure including the risks, benefits and alternatives for the proposed anesthesia with the patient or authorized representative who has indicated his/her understanding and acceptance.     Plan Discussed with:   Anesthesia Plan Comments:         Anesthesia Quick Evaluation

## 2015-08-12 NOTE — Progress Notes (Signed)
[place] for Charles Brooks & Zosyn Indication: cellulitis, wound infection, (+) MRSA  Allergies  Allergen Reactions  . Codeine Hives   Assessment: 65 yo male patient, right flank area red and warm to touch. Pt complaining of boil to area.  Treated with broad spectrum antibiotics.  Wound cx with MRSA therefore will take aggressive approach with treatment.  MRSA PCR (+).  SCr improved.    Goal of Therapy:  Charles Brooks trough level 15-20  Plan:  Charles Brooks 1500 mg IV every 24 hours Zosyn 3.375 GM IV every 8 hours, each dose over 4 hours (consider stopping Zosyn) Monitor SCr, renal fxn closely  Patient Measurements: Height: 6' (182.9 cm) Weight: 174 lb 2.6 oz (79 kg) IBW/kg (Calculated) : 77.6  Vital Signs: Temp: 97.9 F (36.6 C) (02/22 1155) Temp Source: Oral (02/22 0903) BP: 119/68 mmHg (02/22 1155) Pulse Rate: 60 (02/22 1155) Intake/Output from previous day: 02/21 0701 - 02/22 0700 In: 1696.3 [P.O.:720; I.V.:876.3; IV Piggyback:100] Out: 1400 [Urine:1400] Intake/Output from this shift: Total I/O In: 900 [I.V.:900] Out: 600 [Urine:600]  Labs:  Recent Labs  08/10/15 0511 08/11/15 0658 08/12/15 0518  WBC 13.8* 13.3* 10.2  HGB 11.2* 10.6* 10.7*  PLT 289 273 282  CREATININE 1.44* 1.36* 1.29*   Estimated Creatinine Clearance: 62.7 mL/min (by C-G formula based on Cr of 1.29).  Recent Labs  08/11/15 2230  [facility] 14    Microbiology: Recent Results (from the past 720 hour(s))  Wound culture     Status: None   Collection Time: 08/08/15 11:45 PM  Result Value Ref Range Status   Specimen Description WOUND SITE NOT SPECIFIED  Final   Special Requests NONE  Final   Gram Stain   Final    ABUNDANT WBC PRESENT, PREDOMINANTLY PMN NO SQUAMOUS EPITHELIAL CELLS SEEN ABUNDANT GRAM POSITIVE COCCI IN CLUSTERS Performed at [facility]    Culture   Final    MODERATE METHICILLIN RESISTANT STAPHYLOCOCCUS AUREUS Note: RIFAMPIN AND  GENTAMICIN SHOULD NOT BE USED AS SINGLE DRUGS FOR TREATMENT OF STAPH INFECTIONS. This organism DOES NOT demonstrate inducible Clindamycin resistance in vitro. CRITICAL RESULT CALLED TO, READ BACK BY AND VERIFIED WITH: Peter Congo 2/22  @0845  BY REAMM Performed at [facility]    Report Status 08/12/2015 FINAL  Final   Organism ID, Bacteria METHICILLIN RESISTANT STAPHYLOCOCCUS AUREUS  Final      Susceptibility   Methicillin resistant staphylococcus aureus - MIC*    CLINDAMYCIN <=0.25 SENSITIVE Sensitive     ERYTHROMYCIN >=8 RESISTANT Resistant     GENTAMICIN <=0.5 SENSITIVE Sensitive     LEVOFLOXACIN 4 INTERMEDIATE Intermediate     OXACILLIN >=4 RESISTANT Resistant     RIFAMPIN <=0.5 SENSITIVE Sensitive     TRIMETH/SULFA <=10 SENSITIVE Sensitive     Charles Brooks 1 SENSITIVE Sensitive     TETRACYCLINE <=1 SENSITIVE Sensitive     * MODERATE METHICILLIN RESISTANT STAPHYLOCOCCUS AUREUS  MRSA PCR Screening     Status: Abnormal   Collection Time: 08/11/15  5:30 PM  Result Value Ref Range Status   MRSA by PCR POSITIVE (A) NEGATIVE Final    Comment:        The GeneXpert MRSA Assay (FDA approved for NASAL specimens only), is one component of a comprehensive MRSA colonization surveillance program. It is not intended to diagnose MRSA infection nor to guide or monitor treatment for MRSA infections. RESULT CALLED TO, READ BACK BY AND VERIFIED WITH: HEARN,J ON 08/11/15 AT 2140 BY LOY,C  Medical History: Past Medical History  Diagnosis Date  . Arthritis   . STEMI (ST elevation myocardial infarction) ([place])     07/2014  . CAD (coronary artery disease), native coronary artery     DES RCA and PTCA RVM 07/2014, LVEF 55%  . CKD (chronic kidney disease) stage 3, GFR 30-59 ml/min   . Hypertension   . Hyperlipidemia    Medications:  Scheduled:  . sodium chloride   Intravenous Once  . atorvastatin  40 mg Oral Daily  . Chlorhexidine Gluconate Cloth  6 each Topical Q0600  . metoprolol  tartrate  12.5 mg Oral BID  . mupirocin cream   Topical TID  . mupirocin ointment  1 application Nasal BID  . piperacillin-tazobactam (ZOSYN)  IV  3.375 g Intravenous Q8H  . sodium chloride flush  3 mL Intravenous Q12H  . Charles Brooks  1,500 mg Intravenous Q24H   Thanks for allowing pharmacy to participate in the care of this patient.   Hart Robinsons A 08/12/2015,12:33 PM

## 2015-08-13 ENCOUNTER — Encounter (HOSPITAL_COMMUNITY): Payer: Self-pay | Admitting: General Surgery

## 2015-08-13 DIAGNOSIS — N183 Chronic kidney disease, stage 3 (moderate): Secondary | ICD-10-CM | POA: Diagnosis not present

## 2015-08-13 DIAGNOSIS — L0390 Cellulitis, unspecified: Secondary | ICD-10-CM | POA: Diagnosis not present

## 2015-08-13 DIAGNOSIS — L0291 Cutaneous abscess, unspecified: Secondary | ICD-10-CM | POA: Diagnosis not present

## 2015-08-13 DIAGNOSIS — I2510 Atherosclerotic heart disease of native coronary artery without angina pectoris: Secondary | ICD-10-CM | POA: Diagnosis not present

## 2015-08-13 LAB — CBC
HEMATOCRIT: 35.3 % — AB (ref 39.0–52.0)
HEMOGLOBIN: 11.5 g/dL — AB (ref 13.0–17.0)
MCH: 30.7 pg (ref 26.0–34.0)
MCHC: 32.6 g/dL (ref 30.0–36.0)
MCV: 94.4 fL (ref 78.0–100.0)
PLATELETS: 290 10*3/uL (ref 150–400)
RBC: 3.74 MIL/uL — AB (ref 4.22–5.81)
RDW: 12.9 % (ref 11.5–15.5)
WBC: 9.3 10*3/uL (ref 4.0–10.5)

## 2015-08-13 LAB — BASIC METABOLIC PANEL
ANION GAP: 7 (ref 5–15)
BUN: 17 mg/dL (ref 6–20)
CHLORIDE: 110 mmol/L (ref 101–111)
CO2: 24 mmol/L (ref 22–32)
Calcium: 8.4 mg/dL — ABNORMAL LOW (ref 8.9–10.3)
Creatinine, Ser: 1.21 mg/dL (ref 0.61–1.24)
GFR calc Af Amer: >60 mL/min (ref >60)
GLUCOSE: 98 mg/dL (ref 65–99)
POTASSIUM: 4.2 mmol/L (ref 3.5–5.1)
Sodium: 141 mmol/L (ref 135–145)

## 2015-08-13 MED ORDER — OXYCODONE-ACETAMINOPHEN 5-325 MG PO TABS: 1.0000 | ORAL_TABLET | Freq: Four times a day (QID) | ORAL | Status: DC | PRN

## 2015-08-13 NOTE — Anesthesia Postprocedure Evaluation (Signed)
Anesthesia Post Note  Patient: TEAGAN GALAS  Procedure(s) Performed: Procedure(s) (LRB): INCISION AND DRAINAGE ABSCESS (Right)  Patient location during evaluation: Nursing Unit Anesthesia Type: General Level of consciousness: awake and alert and patient cooperative Pain management: pain level controlled Vital Signs Assessment: post-procedure vital signs reviewed and stable Respiratory status: nonlabored ventilation and respiratory function stable Cardiovascular status: blood pressure returned to baseline Postop Assessment: no signs of nausea or vomiting Anesthetic complications: no    Last Vitals:  Filed Vitals:   08/12/15 1700 08/13/15 0500  BP: 121/71 118/70  Pulse: 61 62  Temp: 36.7 C 36.9 C  Resp: 18 18    Last Pain:  Filed Vitals:   08/13/15 0845  PainSc: 0-No pain                 Dovber Ernest J

## 2015-08-13 NOTE — Progress Notes (Signed)
1 Day Post-Op  Subjective: Patient feels fine.  Objective: Vital signs in last 24 hours: Temp:  [97.7 F (36.5 C)-98.5 F (36.9 C)] 98.5 F (36.9 C) (02/23 0500) Pulse Rate:  [54-77] 62 (02/23 0500) Resp:  [0-25] 18 (02/23 0500) BP: (104-134)/(36-84) 118/70 mmHg (02/23 0500) SpO2:  [85 %-100 %] 100 % (02/23 0500) Last BM Date: 08/11/15  Intake/Output from previous day: 02/22 0701 - 02/23 0700 In: 1880 [P.O.:480; I.V.:900; IV Piggyback:500] Out: 1950 [Urine:1951] Intake/Output this shift:    General appearance: alert, cooperative and no distress Skin: Packing removed from right flank. Induration and erythema much improved.  Lab Results:   Recent Labs  08/12/15 0518 08/13/15 0535  WBC 10.2 9.3  HGB 10.7* 11.5*  HCT 32.5* 35.3*  PLT 282 290   BMET  Recent Labs  08/12/15 0518 08/13/15 0535  NA 140 141  K 4.3 4.2  CL 109 110  CO2 25 24  GLUCOSE 105* 98  BUN 18 17  CREATININE 1.29* 1.21  CALCIUM 8.4* 8.4*   PT/INR No results for input(s): LABPROT, INR in the last 72 hours.  Studies/Results: Dg Chest 2 View  08/11/2015  CLINICAL DATA:  History of hypertension and Coronary artery disease. Also chronic renal insufficiency, former smoker. EXAM: CHEST  2 VIEW COMPARISON:  Report of a chest x-ray of Oct 24, 2001. FINDINGS: The lungs are adequately inflated. Subtle increased density projects over the posterior aspect of the right 6 rib. There are coarse lung markings in the right infrahilar region. The left lung is clear. The heart and pulmonary vascularity are normal. The mediastinum is normal in width. There is no pleural effusion or pneumothorax. The bony thorax is unremarkable. IMPRESSION: Subtle increased density in the right mid upper lobe and in the right lower lobe. On the recent CT subtle patchy density was noted in both lower lobes. Elsewhere the chest exhibits no significant abnormality. CT scanning of the chest is recommended to exclude pulmonary nodules or  masses. Electronically Signed   By: David  Martinique M.D.   On: 08/11/2015 09:21    Anti-infectives: Anti-infectives    Start     Dose/Rate Route Frequency Ordered Stop   08/12/15 2000  vancomycin (VANCOCIN) 1,500 mg in sodium chloride 0.9 % 500 mL IVPB     1,500 mg 250 mL/hr over 120 Minutes Intravenous Every 24 hours 08/12/15 1232     08/09/15 2300  vancomycin (VANCOCIN) 1,250 mg in sodium chloride 0.9 % 250 mL IVPB  Status:  Discontinued     1,250 mg 166.7 mL/hr over 90 Minutes Intravenous Every 24 hours 08/08/15 2213 08/12/15 1232   08/09/15 0000  piperacillin-tazobactam (ZOSYN) IVPB 3.375 g  Status:  Discontinued     3.375 g 12.5 mL/hr over 240 Minutes Intravenous Every 8 hours 08/08/15 2347 08/12/15 1248   08/08/15 2145  vancomycin (VANCOCIN) 1,500 mg in sodium chloride 0.9 % 500 mL IVPB     1,500 mg 250 mL/hr over 120 Minutes Intravenous  Once 08/08/15 2137 08/09/15 0020      Assessment/Plan: s/p Procedure(s): INCISION AND DRAINAGE ABSCESS Impression: Stable, status post IND of right flank abscess. It is growing MRSA.  Plan: Okay for discharge from surgery standpoint. Would place him on 2 weeks of Bactrim. Home health to perform normal saline wet-to-dry dressings daily on right flank. We'll see him in my office next week.  LOS: 5 days    Lyla Jasek A 08/13/2015

## 2015-08-13 NOTE — Progress Notes (Signed)
Discharge instructions read to patient hand his spouse.  Both verbalized understanding of all instructions.  Discharged to home with spouse.

## 2015-08-13 NOTE — Care Management Important Message (Signed)
Important Message  Patient Details  Name: Charles Brooks MRN: [id number] Date of Birth: September 22, 1950   Medicare Important Message Given:  Yes    Sherald Barge, RN 08/13/2015, 1:22 PM

## 2015-08-13 NOTE — Addendum Note (Signed)
Addendum  created 08/13/15 1206 by Charmaine Downs, CRNA   Modules edited: Clinical Notes   Clinical Notes:  File: [id number]

## 2015-08-13 NOTE — Care Management Note (Signed)
Case Management Note  Patient Details  Name: Charles Brooks MRN: [id number] Date of Birth: 05-30-1951  Expected Discharge Date:  08/13/15               Expected Discharge Plan:  [place]  In-House Referral:  NA  Discharge planning Services  CM Consult  Post Acute Care Choice:  Home Health Choice offered to:  Patient  DME Arranged:    DME Agency:     HH Arranged:  RN [place] Agency:  [place]  Status of Service:  Completed, signed off  Medicare Important Message Given:  Yes Date Medicare IM Given:    Medicare IM give by:    Date Additional Medicare IM Given:    Additional Medicare Important Message give by:     If discussed at [place] of Stay Meetings, dates discussed:  08/13/2015  Additional Comments: Pt discharging home today with [facility] nursing services for wound care. Pt has chosen [facility] as [place] agency. Romualdo Bolk, of [facility], made aware of referral and will obtain pt info from chart. Pt's family made aware they will be taught by [facility] nurse how to perform wound care, family is agreeable to learn. Pt aware HH has 48 hours to initiate services.   Sherald Barge, RN 08/13/2015, 1:23 PM

## 2015-08-13 NOTE — Progress Notes (Signed)
Dressing completed per order to right flank wound 1 4x4 saline soaked gauze packed into incision area covered with dry 4x4s and cover derm.  Pt tolerated procedure well.

## 2015-08-13 NOTE — Discharge Summary (Signed)
Physician Discharge Summary  Charles Brooks [id number] DOB: 1951/03/17 DOA: 08/08/2015  PCP: Deloria Lair, MD  Admit date: 08/08/2015 Discharge date: 08/13/2015  Time spent: 35 minutes  Recommendations for Outpatient Follow-up:  1. Follow-up with PCP within 1-2 weeks. 2. Home health RN for dressing changes.  3. Follow up with general surgery in one week.     Discharge Diagnoses:  Principal Problem:   Cellulitis and abscess Active Problems:   CAD (coronary artery disease), native coronary artery   CKD (chronic kidney disease) stage 3, GFR 30-59 ml/min   Discharge Condition: Improved   Diet recommendation: Heart healthy   Filed Weights   08/08/15 1924 08/09/15 1215  Weight: 78.019 kg (172 lb) 79 kg (174 lb 2.6 oz)    History of present illness:  65 yom presented with persistent redness and pain to right flank area for two weeks. He reports that it started draining pus several days ago. In the ED noted to have large abscess to the right flank area with spontaneous drainage of pus. He was admitted for further treatment.   Hospital Course:  Patient was admitted for right flank cellulitis/abscess.  Improvement noted in WBC with abx and he remained afebrile and normal lactic acid. Wound culture from 2/18 grew MRSA. He was treated with approprietly with vancomycin and clinically improved.  General surgery was consulted. I&D, which was was delayed due to antiplatelet medications, performed [id number] without complications. Dressing was applied to right flank and has not been removed. On discharge surgical site appears to be healing well and he was transitioned to oral abx. 1. Hx of CAD, stable. No CP. Continue lipitor and Metoprolol. ASA and Brilinta were being held. Resume on discharge.  2. Essential HTN, stable. Continue Metoprolol. 3. CKD stage III. Creatinine appears to be at baseline.  Procedures:  Incision and drainage of abscess, right flank/back  2/22  Consultations:  General Surgery   Discharge Exam: Filed Vitals:   08/12/15 1700 08/13/15 0500  BP: 121/71 118/70  Pulse: 61 62  Temp: 98 F (36.7 C) 98.5 F (36.9 C)  Resp: 18 18    4. General: NAD, looks comfortable 5. Cardiovascular: RRR, S1, S2  6. Respiratory: clear bilaterally, No wheezing, rales or rhonchi 7. Abdomen: soft, non tender, no distention , bowel sounds normal 8. Musculoskeletal: No edema b/l   Discharge Instructions    Current Discharge Medication List    CONTINUE these medications which have NOT CHANGED   Details  acetaminophen (TYLENOL) 500 MG tablet Take 500 mg by mouth every 6 (six) hours as needed for mild pain or moderate pain.    aspirin EC 81 MG EC tablet Take 1 tablet (81 mg total) by mouth daily.    atorvastatin (LIPITOR) 40 MG tablet Take 1 tablet (40 mg total) by mouth daily. Qty: 90 tablet, Refills: 3    guaiFENesin (ROBITUSSIN) 100 MG/5ML SOLN Take 5 mLs by mouth every 4 (four) hours as needed for cough or to loosen phlegm.    metoprolol tartrate (LOPRESSOR) 25 MG tablet Take 0.5 tablets (12.5 mg total) by mouth 2 (two) times daily. Qty: 45 tablet, Refills: 11    nitroGLYCERIN (NITROSTAT) 0.4 MG SL tablet Place 1 tablet (0.4 mg total) under the tongue every 5 (five) minutes x 3 doses as needed for chest pain. Qty: 25 tablet, Refills: 12    Phenylephrine-DM-GG-APAP (TYLENOL COLD/FLU SEVERE) 5-10-200-325 MG TABS Take 1-2 tablets by mouth daily as needed (for cold symptoms).    ticagrelor (BRILINTA) 90  MG TABS tablet Take 1 tablet (90 mg total) by mouth 2 (two) times daily. Qty: 60 tablet, Refills: 11       Allergies  Allergen Reactions  . Codeine Hives      The results of significant diagnostics from this hospitalization (including imaging, microbiology, ancillary and laboratory) are listed below for reference.    Significant Diagnostic Studies: Ct Abdomen Pelvis Wo Contrast  08/09/2015  CLINICAL DATA:  65 year old  male with worsening redness in the right flank area. Started draining cause several days ago. EXAM: CT ABDOMEN AND PELVIS WITHOUT CONTRAST TECHNIQUE: Multidetector CT imaging of the abdomen and pelvis was performed following the standard protocol without IV contrast. COMPARISON:  CT dated 08/19/2013 FINDINGS: Evaluation of this exam is limited in the absence of intravenous contrast. The visualized lung bases are clear. There is coronary vascular calcification. No intra-abdominal free air or free fluid. Cholecystectomy. Subcentimeter right hepatic hypodense focus is not characterized on the CT. The liver otherwise appears unremarkable. The pancreas, spleen, adrenal glands appear unremarkable. There are bilateral nonobstructing renal calculi measuring up to 11 mm on the right and 7 mm on the left. Bilateral exophytic, parenchymal and parapelvic hypodensities measuring up to 2 cm in the inferior pole of the left kidney appear grossly stable compared to prior study. These lesions are not well characterized but may represent cysts. Ultrasound may provide better evaluation. There is no hydronephrosis on either side. The visualized ureters and urinary bladder appear unremarkable. The prostate and seminal vesicles are grossly unremarkable. Moderate stool noted throughout the colon. There are sigmoid diverticulosis and scattered colonic diverticula without active inflammatory changes. There is no evidence of bowel obstruction or inflammation. Normal appendix. Moderate aortoiliac atherosclerotic disease. The abdominal aorta and IVC appear otherwise grossly unremarkable on this noncontrast study. No portal venous gas identified. There is no adenopathy. There is a small umbilical and left paraumbilical fat containing umbilical hernia. There is no inflammatory changes of the herniated fat. There is inflammatory changes and stranding of the subcutaneous soft tissues of the right flank and gluteal region. Confluent areas of higher  attenuation in the subcutaneous soft tissues of the right flank most compatible with hematoma. No drainable fluid collection identified. There is degenerative changes of the spine. No acute fracture. IMPRESSION: Right flank subcutaneous hematoma. No drainable fluid collection or abscess. Nonobstructing bilateral renal calculi. No hydronephrosis. Incompletely characterized bilateral renal hypodensities, likely cysts. Ultrasound may provide better evaluation. Diverticulosis. No evidence of bowel obstruction or inflammation. Normal appendix. Electronically Signed   By: Anner Crete M.D.   On: 08/09/2015 00:08   Dg Chest 2 View  08/11/2015  CLINICAL DATA:  History of hypertension and Coronary artery disease. Also chronic renal insufficiency, former smoker. EXAM: CHEST  2 VIEW COMPARISON:  Report of a chest x-ray of Oct 24, 2001. FINDINGS: The lungs are adequately inflated. Subtle increased density projects over the posterior aspect of the right 6 rib. There are coarse lung markings in the right infrahilar region. The left lung is clear. The heart and pulmonary vascularity are normal. The mediastinum is normal in width. There is no pleural effusion or pneumothorax. The bony thorax is unremarkable. IMPRESSION: Subtle increased density in the right mid upper lobe and in the right lower lobe. On the recent CT subtle patchy density was noted in both lower lobes. Elsewhere the chest exhibits no significant abnormality. CT scanning of the chest is recommended to exclude pulmonary nodules or masses. Electronically Signed   By: David  Martinique M.D.  On: 08/11/2015 09:21    Microbiology: Recent Results (from the past 240 hour(s))  Wound culture     Status: None   Collection Time: 08/08/15 11:45 PM  Result Value Ref Range Status   Specimen Description WOUND SITE NOT SPECIFIED  Final   Special Requests NONE  Final   Gram Stain   Final    ABUNDANT WBC PRESENT, PREDOMINANTLY PMN NO SQUAMOUS EPITHELIAL CELLS  SEEN ABUNDANT GRAM POSITIVE COCCI IN CLUSTERS Performed at [facility]    Culture   Final    MODERATE METHICILLIN RESISTANT STAPHYLOCOCCUS AUREUS Note: RIFAMPIN AND GENTAMICIN SHOULD NOT BE USED AS SINGLE DRUGS FOR TREATMENT OF STAPH INFECTIONS. This organism DOES NOT demonstrate inducible Clindamycin resistance in vitro. CRITICAL RESULT CALLED TO, READ BACK BY AND VERIFIED WITH: Peter Congo 2/22  @0845  BY REAMM Performed at [facility]    Report Status 08/12/2015 FINAL  Final   Organism ID, Bacteria METHICILLIN RESISTANT STAPHYLOCOCCUS AUREUS  Final      Susceptibility   Methicillin resistant staphylococcus aureus - MIC*    CLINDAMYCIN <=0.25 SENSITIVE Sensitive     ERYTHROMYCIN >=8 RESISTANT Resistant     GENTAMICIN <=0.5 SENSITIVE Sensitive     LEVOFLOXACIN 4 INTERMEDIATE Intermediate     OXACILLIN >=4 RESISTANT Resistant     RIFAMPIN <=0.5 SENSITIVE Sensitive     TRIMETH/SULFA <=10 SENSITIVE Sensitive     VANCOMYCIN 1 SENSITIVE Sensitive     TETRACYCLINE <=1 SENSITIVE Sensitive     * MODERATE METHICILLIN RESISTANT STAPHYLOCOCCUS AUREUS  MRSA PCR Screening     Status: Abnormal   Collection Time: 08/11/15  5:30 PM  Result Value Ref Range Status   MRSA by PCR POSITIVE (A) NEGATIVE Final    Comment:        The GeneXpert MRSA Assay (FDA approved for NASAL specimens only), is one component of a comprehensive MRSA colonization surveillance program. It is not intended to diagnose MRSA infection nor to guide or monitor treatment for MRSA infections. RESULT CALLED TO, READ BACK BY AND VERIFIED WITH: HEARN,J ON 08/11/15 AT 2140 BY LOY,C      Labs: Basic Metabolic Panel:  Recent Labs Lab 08/09/15 0623 08/10/15 0511 08/11/15 0658 08/12/15 0518 08/13/15 0535  NA 136 138 139 140 141  K 4.4 4.2 4.2 4.3 4.2  CL 107 108 109 109 110  CO2 20* 21* 22 25 24   GLUCOSE 98 105* 115* 105* 98  BUN 25* 21* 19 18 17   CREATININE 1.51* 1.44* 1.36* 1.29* 1.21  CALCIUM  8.3* 8.4* 8.3* 8.4* 8.4*   CBC:  Recent Labs Lab 08/08/15 2100 08/09/15 0623 08/10/15 0511 08/11/15 0658 08/12/15 0518 08/13/15 0535  WBC 16.6* 15.1* 13.8* 13.3* 10.2 9.3  NEUTROABS 13.3*  --   --   --   --   --   HGB 11.4* 11.0* 11.2* 10.6* 10.7* 11.5*  HCT 34.0* 33.2* 34.1* 32.4* 32.5* 35.3*  MCV 91.6 92.5 93.2 93.4 93.4 94.4  PLT 305 283 289 273 282 290   BNP: BNP (last 3 results)  Recent Labs  08/14/14 0730  BNP 30.2    Signed: Kathie Dike, MD [facility] 08/13/2015, 7:56 AM    By signing my name below, I, Rennis Harding, attest that this documentation has been prepared under the direction and in the presence of Kathie Dike, MD. Electronically signed: Rennis Harding, Scribe. 08/13/2015 10:17am   I, Dr. Kathie Dike, personally performed the services described in this documentaiton. All medical record entries made  by the scribe were at my direction and in my presence. I have reviewed the chart and agree that the record reflects my personal performance and is accurate and complete  Kathie Dike, MD, 08/13/2015 10:38 AM

## 2015-08-14 DIAGNOSIS — L02211 Cutaneous abscess of abdominal wall: Secondary | ICD-10-CM | POA: Diagnosis not present

## 2015-08-14 DIAGNOSIS — B9562 Methicillin resistant Staphylococcus aureus infection as the cause of diseases classified elsewhere: Secondary | ICD-10-CM | POA: Diagnosis not present

## 2015-08-14 DIAGNOSIS — I2510 Atherosclerotic heart disease of native coronary artery without angina pectoris: Secondary | ICD-10-CM | POA: Diagnosis not present

## 2015-08-14 DIAGNOSIS — N183 Chronic kidney disease, stage 3 (moderate): Secondary | ICD-10-CM | POA: Diagnosis not present

## 2015-08-14 DIAGNOSIS — I129 Hypertensive chronic kidney disease with stage 1 through stage 4 chronic kidney disease, or unspecified chronic kidney disease: Secondary | ICD-10-CM | POA: Diagnosis not present

## 2015-08-18 DIAGNOSIS — B9562 Methicillin resistant Staphylococcus aureus infection as the cause of diseases classified elsewhere: Secondary | ICD-10-CM | POA: Diagnosis not present

## 2015-08-18 DIAGNOSIS — N183 Chronic kidney disease, stage 3 (moderate): Secondary | ICD-10-CM | POA: Diagnosis not present

## 2015-08-18 DIAGNOSIS — I2510 Atherosclerotic heart disease of native coronary artery without angina pectoris: Secondary | ICD-10-CM | POA: Diagnosis not present

## 2015-08-18 DIAGNOSIS — L02211 Cutaneous abscess of abdominal wall: Secondary | ICD-10-CM | POA: Diagnosis not present

## 2015-08-18 DIAGNOSIS — I129 Hypertensive chronic kidney disease with stage 1 through stage 4 chronic kidney disease, or unspecified chronic kidney disease: Secondary | ICD-10-CM | POA: Diagnosis not present

## 2015-08-21 DIAGNOSIS — I129 Hypertensive chronic kidney disease with stage 1 through stage 4 chronic kidney disease, or unspecified chronic kidney disease: Secondary | ICD-10-CM | POA: Diagnosis not present

## 2015-08-21 DIAGNOSIS — I2510 Atherosclerotic heart disease of native coronary artery without angina pectoris: Secondary | ICD-10-CM | POA: Diagnosis not present

## 2015-08-21 DIAGNOSIS — B9562 Methicillin resistant Staphylococcus aureus infection as the cause of diseases classified elsewhere: Secondary | ICD-10-CM | POA: Diagnosis not present

## 2015-08-21 DIAGNOSIS — L02211 Cutaneous abscess of abdominal wall: Secondary | ICD-10-CM | POA: Diagnosis not present

## 2015-08-21 DIAGNOSIS — N183 Chronic kidney disease, stage 3 (moderate): Secondary | ICD-10-CM | POA: Diagnosis not present

## 2015-08-25 DIAGNOSIS — I2510 Atherosclerotic heart disease of native coronary artery without angina pectoris: Secondary | ICD-10-CM | POA: Diagnosis not present

## 2015-08-25 DIAGNOSIS — N183 Chronic kidney disease, stage 3 (moderate): Secondary | ICD-10-CM | POA: Diagnosis not present

## 2015-08-25 DIAGNOSIS — L02211 Cutaneous abscess of abdominal wall: Secondary | ICD-10-CM | POA: Diagnosis not present

## 2015-08-25 DIAGNOSIS — B9562 Methicillin resistant Staphylococcus aureus infection as the cause of diseases classified elsewhere: Secondary | ICD-10-CM | POA: Diagnosis not present

## 2015-08-25 DIAGNOSIS — I129 Hypertensive chronic kidney disease with stage 1 through stage 4 chronic kidney disease, or unspecified chronic kidney disease: Secondary | ICD-10-CM | POA: Diagnosis not present

## 2015-08-27 DIAGNOSIS — L0291 Cutaneous abscess, unspecified: Secondary | ICD-10-CM | POA: Diagnosis not present

## 2015-08-27 DIAGNOSIS — I10 Essential (primary) hypertension: Secondary | ICD-10-CM | POA: Diagnosis not present

## 2015-08-27 DIAGNOSIS — I2510 Atherosclerotic heart disease of native coronary artery without angina pectoris: Secondary | ICD-10-CM | POA: Diagnosis not present

## 2015-09-02 DIAGNOSIS — N183 Chronic kidney disease, stage 3 (moderate): Secondary | ICD-10-CM | POA: Diagnosis not present

## 2015-09-02 DIAGNOSIS — I129 Hypertensive chronic kidney disease with stage 1 through stage 4 chronic kidney disease, or unspecified chronic kidney disease: Secondary | ICD-10-CM | POA: Diagnosis not present

## 2015-09-02 DIAGNOSIS — L02211 Cutaneous abscess of abdominal wall: Secondary | ICD-10-CM | POA: Diagnosis not present

## 2015-09-02 DIAGNOSIS — B9562 Methicillin resistant Staphylococcus aureus infection as the cause of diseases classified elsewhere: Secondary | ICD-10-CM | POA: Diagnosis not present

## 2015-09-02 DIAGNOSIS — I2510 Atherosclerotic heart disease of native coronary artery without angina pectoris: Secondary | ICD-10-CM | POA: Diagnosis not present

## 2015-09-09 DIAGNOSIS — I129 Hypertensive chronic kidney disease with stage 1 through stage 4 chronic kidney disease, or unspecified chronic kidney disease: Secondary | ICD-10-CM | POA: Diagnosis not present

## 2015-09-09 DIAGNOSIS — N183 Chronic kidney disease, stage 3 (moderate): Secondary | ICD-10-CM | POA: Diagnosis not present

## 2015-09-09 DIAGNOSIS — B9562 Methicillin resistant Staphylococcus aureus infection as the cause of diseases classified elsewhere: Secondary | ICD-10-CM | POA: Diagnosis not present

## 2015-09-09 DIAGNOSIS — I2510 Atherosclerotic heart disease of native coronary artery without angina pectoris: Secondary | ICD-10-CM | POA: Diagnosis not present

## 2015-09-09 DIAGNOSIS — L02211 Cutaneous abscess of abdominal wall: Secondary | ICD-10-CM | POA: Diagnosis not present

## 2015-10-16 NOTE — H&P (Signed)
Charles Brooks is an 65 y.o. male.   Chief Complaint: Subcutaneous mass, right flank HPI: Patient is a 65 year old white male status post incision and drainage of a right flank abscess in February 2017 now presents with an ongoing area of induration in subcutaneous tissue just lateral to the previous incision site. It is nontender and no drainage has been noted.  Past Medical History  Diagnosis Date  . Arthritis   . STEMI (ST elevation myocardial infarction) ([place])     07/2014  . CAD (coronary artery disease), native coronary artery     DES RCA and PTCA RVM 07/2014, LVEF 55%  . CKD (chronic kidney disease) stage 3, GFR 30-59 ml/min   . Hypertension   . Hyperlipidemia     Past Surgical History  Procedure Laterality Date  . Cholecystectomy    . Removal of kidney stones      Open  . Knee arthroscopy Right   . Extracorporeal shock wave lithotripsy    . Colonoscopy with propofol N/A 11/08/2013    Procedure: COLONOSCOPY WITH PROPOFOL;  Surgeon: [name], MD;  Location: [facility];  Service: General;  Laterality: N/A;  in cecum at 0741; cecal withdrawal time = 10 min  . Polypectomy N/A 11/08/2013    Procedure: POLYPECTOMY;  Surgeon: [name], MD;  Location: [facility];  Service: General;  Laterality: N/A;  cecal polyp  . Left heart catheterization with coronary angiogram N/A 08/14/2014    Procedure: LEFT HEART CATHETERIZATION WITH CORONARY ANGIOGRAM;  Surgeon: Troy Sine, MD;  LAD 50-60%, CFX OK, OM1 80%(small), RCA 100>>0% w/ 2.25x24 mm Synergy DES, EF 55%  . Incision and drainage abscess Right 08/12/2015    Procedure: INCISION AND DRAINAGE ABSCESS;  Surgeon: Aviva Signs, MD;  Location: [facility];  Service: General;  Laterality: Right;    Family History  Problem Relation Age of Onset  . Heart attack Brother    Social History:  reports that he quit smoking about 11 years ago. His smoking use included Cigars. He has never used smokeless tobacco. He reports that he does not drink alcohol  or use illicit drugs.  Allergies:  Allergies  Allergen Reactions  . Codeine Hives    No prescriptions prior to admission    No results found for this or any previous visit (from the past 48 hour(s)). No results found.  Review of Systems  Constitutional: Negative.   HENT: Negative.   Eyes: Negative.   Respiratory: Negative.   Cardiovascular: Negative.   All other systems reviewed and are negative.   There were no vitals taken for this visit. Physical Exam  Constitutional: He is oriented to person, place, and time. He appears well-developed and well-nourished.  HENT:  Head: Normocephalic and atraumatic.  Neck: Normal range of motion. Neck supple.  Cardiovascular: Normal rate, regular rhythm and normal heart sounds.   Respiratory: Effort normal and breath sounds normal.  GI: Soft. Bowel sounds are normal. He exhibits no distension. There is no tenderness. There is no rebound.  Hard ovoid greater than 5 cm subcutaneous mass noted in right flank.  Neurological: He is alert and oriented to person, place, and time.     Assessment/Plan Impression: Subcutaneous mass, right flank, status post incision and drainage of abscess. Plan: Patient is scheduled for debridement of right flank wound on 10/23/2015. The risks and benefits of the procedure including bleeding, infection, a retained dressing, and recurrence of the mass were fully explained to the patient, who gave informed consent.  [name], MD 10/16/2015, 7:37 AM

## 2015-10-19 NOTE — Patient Instructions (Signed)
Charles Brooks  10/19/2015     @PREFPERIOPPHARMACY @   Your procedure is scheduled on 10/23/2015.  Report to [facility] at 8:45 A.M.  Call this number if you have problems the morning of surgery:  [phone]   Remember:  Do not eat food or drink liquids after midnight.  Take these medicines the morning of surgery with A SIP OF WATER Metoprolol   Do not wear jewelry, make-up or nail polish.  Do not wear lotions, powders, or perfumes.  You may wear deodorant.  Do not shave 48 hours prior to surgery.  Men may shave face and neck.  Do not bring valuables to the hospital.  [facility] is not responsible for any belongings or valuables.  Contacts, dentures or bridgework may not be worn into surgery.  Leave your suitcase in the car.  After surgery it may be brought to your room.  For patients admitted to the hospital, discharge time will be determined by your treatment team.  Patients discharged the day of surgery will not be allowed to drive home.    Please read over the following fact sheets that you were given. Surgical Site Infection Prevention and Anesthesia Post-op Instructions     PATIENT INSTRUCTIONS POST-ANESTHESIA  IMMEDIATELY FOLLOWING SURGERY:  Do not drive or operate machinery for the first twenty four hours after surgery.  Do not make any important decisions for twenty four hours after surgery or while taking narcotic pain medications or sedatives.  If you develop intractable nausea and vomiting or a severe headache please notify your doctor immediately.  FOLLOW-UP:  Please make an appointment with your surgeon as instructed. You do not need to follow up with anesthesia unless specifically instructed to do so.  WOUND CARE INSTRUCTIONS (if applicable):  Keep a dry clean dressing on the anesthesia/puncture wound site if there is drainage.  Once the wound has quit draining you may leave it open to air.  Generally you should leave the bandage intact for twenty four hours  unless there is drainage.  If the epidural site drains for more than 36-48 hours please call the anesthesia department.  QUESTIONS?:  Please feel free to call your physician or the hospital operator if you have any questions, and they will be happy to assist you.      Wound Debridement Wound debridement is a procedure used to remove dead tissue and contaminated substances from a wound. A wound must be clean to heal. It also must get a good supply of blood. Anything that is stopping this must be taken out of the wound. This could be dead tissue, scar tissue, fluid buildup, or debris from outside of the body. Wounds that are not cleaned by debridement heal slowly or not at all. They can become infected. Any infection in the tissue can also spread to nearby areas or to other parts of the body through the blood. Wound debridement can be done through a surgical procedure or various other methods. Debridement is sometimes done to get a sample of tissue from the wound. The tissue can be checked under a microscope or sent to a lab for testing.  LET YOUR CAREGIVER KNOW ABOUT:   Any allergies you have.  All medicines you are taking, including vitamins, steroids, herbs, eyedrops, and over-the-counter medicines and creams.   Previous problems you or members of your family have had with the use of anesthetics.   Any blood disorders you have had.   Previous surgeries you have had.  Other health problems you have.  RISKS AND COMPLICATIONS  Generally, wound debridement is a safe procedure. However, as with any medical procedure, complications can occur. Possible complications include:  Bleeding that does not stop.   Infection.   Damage to nerves, blood vessels, or healthy tissue inside the wound.   Pain.   Lack of healing. BEFORE THE PROCEDURE   The caregiver will check the wound for signs of healing or infection. A measurement of the wound will be taken, including how deep it is. A  metal tool (probe) may be used. Blood tests may be done to check for infection.   If you will be given medicine to make you sleep through the procedure (general anesthetic), do not eat or drink anything for at least 6 hours before the procedure. Ask your caregiver if it is okay to have a sip of water with any needed medicine.   Make plans to have someone drive you home after the procedure. Also, make sure someone can stay with you for a few days.  PROCEDURE  The following methods may be used alone or in combination. Surgical debridement:  Small monitors may be placed on your body. They are used to check your heart, blood pressure, and oxygen level.   You may be given medicine through an intravenous (IV) access tube in your hand or arm.   You might be given medicine to help you relax (sedative).   You may be given medicine to numb the area around the wound (local anesthetic). If the wound is deep or wide, you may be given general anesthetic to make you sleep through the procedure.   Once you are asleep or the wound area is numb, the wound may be washed with a sterile saltwater solution.   Scissors, surgical knives (scalpels), and surgical tweezers (forceps) will be used to remove dead or dying tissue. Any other material that should not be in the wound will also be taken out.   After the tissue and other material have been removed from the wound, the wound will be washed again.   A bandage (dressing) may be placed over the wound.  Mechanical debridement: Mechanical debridement may involve various techniques:  A dressing may be used to pull off dead tissue. A moist dressing is placed over the wound. It is left in place until it is dry. When the dressing is lifted off, this lifts away the dead tissue.   Whirlpool baths may be used to flush the wound with forceful streams of hot water.   The wound may be flushed with sterile solution.  Surgical instruments that use water  under high pressure may be used to clean the wound. Chemical debridement:  A chemical medicine is put on the wound. The aim is to dissolve dead or dying tissue. Ointments may also be used. Autolytic debridement:  A special dressing is used to trap moisture inside the wound. The goal is for the wound to heal naturally under the dressing. Healing takes longer with this treatment. AFTER THE PROCEDURE   If a local anesthetic is used, you will be allowed to go home as soon as you are ready. If a general anesthetic is used, you will be taken to a recovery area until you are stable. Your blood pressure and pulse will be checked often. You may continue to get fluids through the IV tube for a while. Once you are stable, you may be able to go home, or you may need to stay in  the hospital overnight.Your caregiver will decide when you can go home.   You may feel some pain. You will likely be given medicine for pain.  Before you go home, make sure you know how to care for the wound. This includes knowing when the dressing should be changed and how to change it.   Set up a follow-up appointment before leaving.   This information is not intended to replace advice given to you by your health care provider. Make sure you discuss any questions you have with your health care provider.   Document Released: 08/31/2009 Document Revised: 05/23/2012 Document Reviewed: 10/15/2014 Elsevier Interactive Patient Education [employer].

## 2015-10-20 ENCOUNTER — Encounter (HOSPITAL_COMMUNITY)
Admission: RE | Admit: 2015-10-20 | Discharge: 2015-10-20 | Disposition: A | Discharge status: Home / Self Care | Payer: PPO | Source: Ambulatory Visit | Attending: General Surgery | Admitting: General Surgery

## 2015-10-20 ENCOUNTER — Encounter (HOSPITAL_COMMUNITY): Payer: Self-pay

## 2015-10-20 DIAGNOSIS — Z01812 Encounter for preprocedural laboratory examination: Secondary | ICD-10-CM | POA: Diagnosis not present

## 2015-10-20 DIAGNOSIS — S31103A Unspecified open wound of abdominal wall, right lower quadrant without penetration into peritoneal cavity, initial encounter: Secondary | ICD-10-CM | POA: Diagnosis not present

## 2015-10-20 DIAGNOSIS — X58XXXA Exposure to other specified factors, initial encounter: Secondary | ICD-10-CM | POA: Diagnosis not present

## 2015-10-20 LAB — BASIC METABOLIC PANEL
ANION GAP: 7 (ref 5–15)
BUN: 19 mg/dL (ref 6–20)
CHLORIDE: 110 mmol/L (ref 101–111)
CO2: 23 mmol/L (ref 22–32)
Calcium: 8.9 mg/dL (ref 8.9–10.3)
Creatinine, Ser: 1.34 mg/dL — ABNORMAL HIGH (ref 0.61–1.24)
GFR calc non Af Amer: 54 mL/min — ABNORMAL LOW (ref >60)
GLUCOSE: 124 mg/dL — AB (ref 65–99)
POTASSIUM: 4.3 mmol/L (ref 3.5–5.1)
Sodium: 140 mmol/L (ref 135–145)

## 2015-10-20 LAB — CBC WITH DIFFERENTIAL/PLATELET
BASOS PCT: 0 %
Basophils Absolute: 0 10*3/uL (ref 0.0–0.1)
Eosinophils Absolute: 0.1 10*3/uL (ref 0.0–0.7)
Eosinophils Relative: 1 %
HEMATOCRIT: 38.2 % — AB (ref 39.0–52.0)
HEMOGLOBIN: 12.4 g/dL — AB (ref 13.0–17.0)
LYMPHS PCT: 38 %
Lymphs Abs: 2.5 10*3/uL (ref 0.7–4.0)
MCH: 30.2 pg (ref 26.0–34.0)
MCHC: 32.5 g/dL (ref 30.0–36.0)
MCV: 93.2 fL (ref 78.0–100.0)
MONO ABS: 0.3 10*3/uL (ref 0.1–1.0)
MONOS PCT: 5 %
NEUTROS ABS: 3.8 10*3/uL (ref 1.7–7.7)
NEUTROS PCT: 56 %
Platelets: 251 10*3/uL (ref 150–400)
RBC: 4.1 MIL/uL — ABNORMAL LOW (ref 4.22–5.81)
RDW: 13 % (ref 11.5–15.5)
WBC: 6.7 10*3/uL (ref 4.0–10.5)

## 2015-10-20 LAB — SURGICAL PCR SCREEN
MRSA, PCR: NEGATIVE
STAPHYLOCOCCUS AUREUS: POSITIVE — AB

## 2015-10-20 NOTE — Pre-Procedure Instructions (Signed)
Patient given information to sign up for my chart at home. 

## 2015-10-26 ENCOUNTER — Ambulatory Visit (HOSPITAL_COMMUNITY)
Admission: RE | Admit: 2015-10-26 | Discharge: 2015-10-26 | Disposition: A | Discharge status: Home / Self Care | Payer: PPO | Source: Ambulatory Visit | Attending: General Surgery | Admitting: General Surgery

## 2015-10-26 ENCOUNTER — Encounter (HOSPITAL_COMMUNITY)
Admission: RE | Disposition: A | Discharge status: Home / Self Care | Payer: Self-pay | Source: Ambulatory Visit | Attending: General Surgery

## 2015-10-26 ENCOUNTER — Ambulatory Visit (HOSPITAL_COMMUNITY): Payer: PPO | Admitting: Anesthesiology

## 2015-10-26 ENCOUNTER — Encounter (HOSPITAL_COMMUNITY): Payer: Self-pay | Admitting: *Deleted

## 2015-10-26 DIAGNOSIS — Z87891 Personal history of nicotine dependence: Secondary | ICD-10-CM | POA: Insufficient documentation

## 2015-10-26 DIAGNOSIS — S31103D Unspecified open wound of abdominal wall, right lower quadrant without penetration into peritoneal cavity, subsequent encounter: Secondary | ICD-10-CM | POA: Insufficient documentation

## 2015-10-26 DIAGNOSIS — X58XXXD Exposure to other specified factors, subsequent encounter: Secondary | ICD-10-CM | POA: Insufficient documentation

## 2015-10-26 DIAGNOSIS — I129 Hypertensive chronic kidney disease with stage 1 through stage 4 chronic kidney disease, or unspecified chronic kidney disease: Secondary | ICD-10-CM | POA: Insufficient documentation

## 2015-10-26 DIAGNOSIS — E785 Hyperlipidemia, unspecified: Secondary | ICD-10-CM | POA: Insufficient documentation

## 2015-10-26 DIAGNOSIS — S31109A Unspecified open wound of abdominal wall, unspecified quadrant without penetration into peritoneal cavity, initial encounter: Secondary | ICD-10-CM | POA: Diagnosis not present

## 2015-10-26 DIAGNOSIS — N183 Chronic kidney disease, stage 3 (moderate): Secondary | ICD-10-CM | POA: Insufficient documentation

## 2015-10-26 HISTORY — PX: WOUND DEBRIDEMENT: SHX247

## 2015-10-26 SURGERY — DEBRIDEMENT, WOUND
Anesthesia: General | Laterality: Right

## 2015-10-26 MED ORDER — PROPOFOL 10 MG/ML IV BOLUS
INTRAVENOUS | Status: DC | PRN
  Administered 2015-10-26: 20 mg via INTRAVENOUS
  Administered 2015-10-26: 140 mg via INTRAVENOUS

## 2015-10-26 MED ORDER — KETOROLAC TROMETHAMINE 30 MG/ML IJ SOLN
INTRAMUSCULAR | Status: AC
  Filled 2015-10-26: qty 1

## 2015-10-26 MED ORDER — MIDAZOLAM HCL 2 MG/2ML IJ SOLN
INTRAMUSCULAR | Status: AC
  Filled 2015-10-26: qty 2

## 2015-10-26 MED ORDER — ONDANSETRON HCL 4 MG/2ML IJ SOLN: 4.0000 mg | Freq: Once | INTRAMUSCULAR | Status: DC | PRN

## 2015-10-26 MED ORDER — PROPOFOL 10 MG/ML IV BOLUS
INTRAVENOUS | Status: AC
  Filled 2015-10-26: qty 20

## 2015-10-26 MED ORDER — DEXTROSE 5 % IV SOLN
INTRAVENOUS | Status: DC | PRN
  Administered 2015-10-26: 10:00:00 via INTRAVENOUS

## 2015-10-26 MED ORDER — FENTANYL CITRATE (PF) 100 MCG/2ML IJ SOLN: 25.0000 ug | INTRAMUSCULAR | Status: DC | PRN

## 2015-10-26 MED ORDER — VANCOMYCIN HCL IN DEXTROSE 1-5 GM/200ML-% IV SOLN
1000.0000 mg | INTRAVENOUS | Status: AC
  Administered 2015-10-26: 1000 mg via INTRAVENOUS
  Filled 2015-10-26: qty 200

## 2015-10-26 MED ORDER — BUPIVACAINE HCL (PF) 0.5 % IJ SOLN
INTRAMUSCULAR | Status: AC
  Filled 2015-10-26: qty 30

## 2015-10-26 MED ORDER — MIDAZOLAM HCL 2 MG/2ML IJ SOLN
1.0000 mg | INTRAMUSCULAR | Status: DC | PRN
  Administered 2015-10-26: 2 mg via INTRAVENOUS

## 2015-10-26 MED ORDER — CHLORHEXIDINE GLUCONATE 4 % EX LIQD: 1.0000 "application " | Freq: Once | CUTANEOUS | Status: DC

## 2015-10-26 MED ORDER — EPHEDRINE SULFATE 50 MG/ML IJ SOLN
INTRAMUSCULAR | Status: DC | PRN
  Administered 2015-10-26: 10 mg via INTRAVENOUS

## 2015-10-26 MED ORDER — LACTATED RINGERS IV SOLN
INTRAVENOUS | Status: DC
  Administered 2015-10-26: 09:00:00 via INTRAVENOUS

## 2015-10-26 MED ORDER — FENTANYL CITRATE (PF) 100 MCG/2ML IJ SOLN
INTRAMUSCULAR | Status: AC
  Filled 2015-10-26: qty 4

## 2015-10-26 MED ORDER — MUPIROCIN 2 % EX OINT: 1.0000 "application " | TOPICAL_OINTMENT | Freq: Once | CUTANEOUS | Status: DC

## 2015-10-26 MED ORDER — POVIDONE-IODINE 10 % OINT PACKET
TOPICAL_OINTMENT | CUTANEOUS | Status: DC | PRN
  Administered 2015-10-26: 1 via TOPICAL

## 2015-10-26 MED ORDER — TRAMADOL HCL 50 MG PO TABS: 50.0000 mg | ORAL_TABLET | Freq: Four times a day (QID) | ORAL | Status: DC | PRN

## 2015-10-26 MED ORDER — KETOROLAC TROMETHAMINE 30 MG/ML IJ SOLN
30.0000 mg | Freq: Once | INTRAMUSCULAR | Status: AC
  Administered 2015-10-26: 30 mg via INTRAVENOUS

## 2015-10-26 MED ORDER — POVIDONE-IODINE 10 % EX OINT
TOPICAL_OINTMENT | CUTANEOUS | Status: AC
  Filled 2015-10-26: qty 1

## 2015-10-26 MED ORDER — FENTANYL CITRATE (PF) 100 MCG/2ML IJ SOLN
INTRAMUSCULAR | Status: DC | PRN
  Administered 2015-10-26: 50 ug via INTRAVENOUS

## 2015-10-26 MED ORDER — MIDAZOLAM HCL 5 MG/5ML IJ SOLN
INTRAMUSCULAR | Status: DC | PRN
  Administered 2015-10-26: 2 mg via INTRAVENOUS

## 2015-10-26 MED ORDER — LIDOCAINE HCL 1 % IJ SOLN
INTRAMUSCULAR | Status: DC | PRN
  Administered 2015-10-26: 25 mg via INTRADERMAL

## 2015-10-26 MED ORDER — BUPIVACAINE HCL (PF) 0.5 % IJ SOLN
INTRAMUSCULAR | Status: DC | PRN
  Administered 2015-10-26: 9 mL

## 2015-10-26 MED ORDER — SODIUM CHLORIDE 0.9 % IR SOLN
Status: DC | PRN
  Administered 2015-10-26: 1000 mL

## 2015-10-26 SURGICAL SUPPLY — 29 items
BAG HAMPER (MISCELLANEOUS) ×3 IMPLANT
CLOTH BEACON ORANGE TIMEOUT ST (SAFETY) ×3 IMPLANT
COVER LIGHT HANDLE STERIS (MISCELLANEOUS) ×6 IMPLANT
ELECT REM PT RETURN 9FT ADLT (ELECTROSURGICAL) ×3
ELECTRODE REM PT RTRN 9FT ADLT (ELECTROSURGICAL) ×1 IMPLANT
GAUZE SPONGE 4X4 12PLY STRL (GAUZE/BANDAGES/DRESSINGS) ×2 IMPLANT
GLOVE BIOGEL M 7.0 STRL (GLOVE) ×2 IMPLANT
GLOVE BIOGEL M STRL SZ7.5 (GLOVE) ×3 IMPLANT
GLOVE BIOGEL PI IND STRL 7.0 (GLOVE) ×1 IMPLANT
GLOVE BIOGEL PI INDICATOR 7.0 (GLOVE) ×2
GLOVE EXAM NITRILE MD LF STRL (GLOVE) ×2 IMPLANT
GLOVE SURG SS PI 7.5 STRL IVOR (GLOVE) ×3 IMPLANT
GOWN STRL REUS W/TWL LRG LVL3 (GOWN DISPOSABLE) ×6 IMPLANT
KIT ROOM TURNOVER APOR (KITS) ×3 IMPLANT
MANIFOLD NEPTUNE II (INSTRUMENTS) ×3 IMPLANT
NDL HYPO 25X1 1.5 SAFETY (NEEDLE) IMPLANT
NEEDLE HYPO 25X1 1.5 SAFETY (NEEDLE) ×3 IMPLANT
NS IRRIG 1000ML POUR BTL (IV SOLUTION) ×3 IMPLANT
PACK MINOR (CUSTOM PROCEDURE TRAY) ×3 IMPLANT
PAD ABD 5X9 TENDERSORB (GAUZE/BANDAGES/DRESSINGS) ×2 IMPLANT
PAD ARMBOARD 7.5X6 YLW CONV (MISCELLANEOUS) ×3 IMPLANT
SET BASIN LINEN APH (SET/KITS/TRAYS/PACK) ×3 IMPLANT
SUT PROLENE 3 0 PS 1 (SUTURE) ×8 IMPLANT
SUT VIC AB 3-0 SH 27 (SUTURE) ×3
SUT VIC AB 3-0 SH 27X BRD (SUTURE) IMPLANT
SWAB CULTURE LIQ STUART DBL (MISCELLANEOUS) ×2 IMPLANT
SYR BULB IRRIGATION 50ML (SYRINGE) ×3 IMPLANT
SYR CONTROL 10ML LL (SYRINGE) ×2 IMPLANT
TAPE CLOTH SURG 4X10 WHT LF (GAUZE/BANDAGES/DRESSINGS) ×2 IMPLANT

## 2015-10-26 NOTE — Transfer of Care (Signed)
Immediate Anesthesia Transfer of Care Note  Patient: Charles Brooks  Procedure(s) Performed: Procedure(s): DEBRIDEMENT WOUND RIGHT FLANK (Right)  Patient Location: PACU  Anesthesia Type:General  Level of Consciousness: awake, alert  and patient cooperative  Airway & Oxygen Therapy: Patient Spontanous Breathing and Patient connected to face mask oxygen  Post-op Assessment: Report given to RN, Post -op Vital signs reviewed and stable and Patient moving all extremities  Post vital signs: Reviewed and stable  Last Vitals:  Filed Vitals:   10/26/15 0940 10/26/15 0945  BP: 138/69 133/72  Temp:    Resp: 26 50    Last Pain: There were no vitals filed for this visit.    Patients Stated Pain Goal: 4 ([id number])  Complications: No apparent anesthesia complications

## 2015-10-26 NOTE — Interval H&P Note (Signed)
History and Physical Interval Note:  10/26/2015 9:43 AM  Charles Brooks  has presented today for surgery, with the diagnosis of wound right flank  The various methods of treatment have been discussed with the patient and family. After consideration of risks, benefits and other options for treatment, the patient has consented to  Procedure(s): DEBRIDEMENT WOUND RIGHT FLANK (Right) as a surgical intervention .  The patient's history has been reviewed, patient examined, no change in status, stable for surgery.  I have reviewed the patient's chart and labs.  Questions were answered to the patient's satisfaction.     Aviva Signs A

## 2015-10-26 NOTE — Anesthesia Postprocedure Evaluation (Signed)
Anesthesia Post Note  Patient: DAWAUN PASE  Procedure(s) Performed: Procedure(s) (LRB): DEBRIDEMENT WOUND RIGHT FLANK (Right)  Patient location during evaluation: PACU Anesthesia Type: General Level of consciousness: awake, oriented and patient cooperative Pain management: pain level controlled Vital Signs Assessment: post-procedure vital signs reviewed and stable Respiratory status: spontaneous breathing, nonlabored ventilation and respiratory function stable Cardiovascular status: blood pressure returned to baseline Postop Assessment: no signs of nausea or vomiting Anesthetic complications: no    Last Vitals:  Filed Vitals:   10/26/15 0940 10/26/15 0945  BP: 138/69 133/72  Temp:    Resp: 26 50    Last Pain: There were no vitals filed for this visit.               Allyssia Skluzacek J

## 2015-10-26 NOTE — Anesthesia Preprocedure Evaluation (Addendum)
Anesthesia Evaluation  Patient identified by MRN, date of birth, ID band Patient awake    Reviewed: Allergy & Precautions, NPO status , Patient's Chart, lab work & pertinent test results, reviewed documented beta blocker date and time   Airway Mallampati: II  TM Distance: <3 FB Neck ROM: Full    Dental  (+) Missing, Chipped, Teeth Intact,    Pulmonary former smoker,    Pulmonary exam normal        Cardiovascular hypertension, Pt. on medications and Pt. on home beta blockers + CAD, + Past MI and + Cardiac Stents  Normal cardiovascular exam     Neuro/Psych    GI/Hepatic   Endo/Other    Renal/GU Renal InsufficiencyRenal disease     Musculoskeletal  (+) Arthritis , Osteoarthritis,    Abdominal Normal abdominal exam  (+)   Peds  Hematology   Anesthesia Other Findings   Reproductive/Obstetrics                            Anesthesia Physical Anesthesia Plan  ASA: III  Anesthesia Plan: General   Post-op Pain Management:    Induction: Intravenous  Airway Management Planned: LMA  Additional Equipment:   Intra-op Plan:   Post-operative Plan: Extubation in OR  Informed Consent: I have reviewed the patients History and Physical, chart, labs and discussed the procedure including the risks, benefits and alternatives for the proposed anesthesia with the patient or authorized representative who has indicated his/her understanding and acceptance.   Dental advisory given  Plan Discussed with: CRNA  Anesthesia Plan Comments:        Anesthesia Quick Evaluation

## 2015-10-26 NOTE — Anesthesia Procedure Notes (Signed)
Procedure Name: LMA Insertion Date/Time: 10/26/2015 10:00 AM Performed by: Charmaine Downs Pre-anesthesia Checklist: Patient identified, Emergency Drugs available, Suction available and Patient being monitored Patient Re-evaluated:Patient Re-evaluated prior to inductionOxygen Delivery Method: Circle system utilized Preoxygenation: Pre-oxygenation with 100% oxygen Intubation Type: IV induction Ventilation: Mask ventilation without difficulty LMA: LMA inserted LMA Size: 4.0 Grade View: Grade II Number of attempts: 1 Placement Confirmation: breath sounds checked- equal and bilateral and positive ETCO2 Tube secured with: Tape Dental Injury: Teeth and Oropharynx as per pre-operative assessment

## 2015-10-26 NOTE — Op Note (Signed)
Patient:  Charles Brooks  DOB:  August 14, 1950  MRN:  [id number]   Preop Diagnosis:  Right flank wound  Postop Diagnosis:  Same  Procedure:  Debridement of right flank wound  Surgeon:  Aviva Signs, M.D.  Anes:  Gen.  Indications:  Patient is a 65 year old white male status post incision and drainage of a large abscess along the right flank now presents with an indurated hard area medial to the previous incision site. The risks and benefits of the procedure were fully explained to the patient, who gave informed consent.  Procedure note:  The patient was placed in left lateral decubitus position after general anesthesia was managed. The right flank was prepped and draped using usual sterile technique with DuraPrep. Surgical site confirmation was performed.  The patient had an ovoid subcutaneous hard mass which measured approimately 3x6 cm in size. Incision was made over this mass down to the subcutaneous tissue. Necrotic tissue was found. A culture of the wound was sent for examination. The soft tissue down to the muscle was sharply debrided and excised using Bovie electrocautery without difficulty. The wound was irrigated with normal saline. The wound was injected with 0.5% Sensorcaine. The skin was closed using 3-0 nylon interrupted sutures. Betadine ointment and dressed a dressing were applied.  All tape and needle counts were correct at the end of the procedure. Patient was awakened and transferred to PACU in stable condition.  Complications:  None  EBL:  Minimal  Specimen:  Aerobic culture of wound, right flank

## 2015-10-27 ENCOUNTER — Encounter (HOSPITAL_COMMUNITY): Payer: Self-pay | Admitting: General Surgery

## 2015-10-29 LAB — WOUND CULTURE

## 2015-11-04 DIAGNOSIS — Z0000 Encounter for general adult medical examination without abnormal findings: Secondary | ICD-10-CM | POA: Diagnosis not present

## 2015-11-04 DIAGNOSIS — E784 Other hyperlipidemia: Secondary | ICD-10-CM | POA: Diagnosis not present

## 2015-11-04 DIAGNOSIS — K439 Ventral hernia without obstruction or gangrene: Secondary | ICD-10-CM | POA: Diagnosis not present

## 2015-11-04 DIAGNOSIS — I2510 Atherosclerotic heart disease of native coronary artery without angina pectoris: Secondary | ICD-10-CM | POA: Diagnosis not present

## 2015-11-04 DIAGNOSIS — I10 Essential (primary) hypertension: Secondary | ICD-10-CM | POA: Diagnosis not present

## 2015-11-04 DIAGNOSIS — Z6824 Body mass index (BMI) 24.0-24.9, adult: Secondary | ICD-10-CM | POA: Diagnosis not present

## 2015-11-04 DIAGNOSIS — Z86010 Personal history of colonic polyps: Secondary | ICD-10-CM | POA: Diagnosis not present

## 2016-02-05 ENCOUNTER — Encounter: Payer: Self-pay | Admitting: Cardiology

## 2016-02-05 ENCOUNTER — Ambulatory Visit (INDEPENDENT_AMBULATORY_CARE_PROVIDER_SITE_OTHER): Payer: PPO | Admitting: Cardiology

## 2016-02-05 VITALS — BP 149/82 | HR 69 | Ht 72.0 in | Wt 180.8 lb

## 2016-02-05 DIAGNOSIS — E785 Hyperlipidemia, unspecified: Secondary | ICD-10-CM

## 2016-02-05 DIAGNOSIS — I2510 Atherosclerotic heart disease of native coronary artery without angina pectoris: Secondary | ICD-10-CM | POA: Diagnosis not present

## 2016-02-05 DIAGNOSIS — I10 Essential (primary) hypertension: Secondary | ICD-10-CM | POA: Diagnosis not present

## 2016-02-05 NOTE — Patient Instructions (Signed)

## 2016-02-05 NOTE — Progress Notes (Signed)
Cardiology Office Note  Date: 02/05/2016   ID: Charles Brooks, DOB 07/02/1950, MRN [id number]  PCP: Deloria Lair, MD  Primary Cardiologist: Rozann Lesches, MD   Chief Complaint  Patient presents with  . Coronary Artery Disease    History of Present Illness: Charles Brooks is a 65 y.o. male last seen in February. He presents for a routine follow-up visit. Continues to do very well without angina symptoms. He is retired, states that he has had less stress. He walks a mile 2 times a day, once in the morning and once in the evening.  I reviewed his medications which are outlined below. He has not required any recent nitroglycerin. He will be seeing Dr. Scotty Court later this year for physical and lab work.  Past Medical History:  Diagnosis Date  . Arthritis   . CAD (coronary artery disease), native coronary artery    DES RCA and PTCA RVM 07/2014, LVEF 55%  . CKD (chronic kidney disease) stage 3, GFR 30-59 ml/min   . Hyperlipidemia   . Hypertension   . STEMI (ST elevation myocardial infarction) ([facility])    07/2014    Past Surgical History:  Procedure Laterality Date  . CHOLECYSTECTOMY    . COLONOSCOPY WITH PROPOFOL N/A 11/08/2013   Procedure: COLONOSCOPY WITH PROPOFOL;  Surgeon: [name], MD;  Location: [facility];  Service: General;  Laterality: N/A;  in cecum at 0741; cecal withdrawal time = 10 min  . EXTRACORPOREAL SHOCK WAVE LITHOTRIPSY    . INCISION AND DRAINAGE ABSCESS Right 08/12/2015   Procedure: INCISION AND DRAINAGE ABSCESS;  Surgeon: Aviva Signs, MD;  Location: [facility];  Service: General;  Laterality: Right;  . KNEE ARTHROSCOPY Right   . LEFT HEART CATHETERIZATION WITH CORONARY ANGIOGRAM N/A 08/14/2014   Procedure: LEFT HEART CATHETERIZATION WITH CORONARY ANGIOGRAM;  Surgeon: Troy Sine, MD;  LAD 50-60%, CFX OK, OM1 80%(small), RCA 100>>0% w/ 2.25x24 mm Synergy DES, EF 55%  . POLYPECTOMY N/A 11/08/2013   Procedure: POLYPECTOMY;  Surgeon: [name], MD;  Location:  [facility];  Service: General;  Laterality: N/A;  cecal polyp  . Removal of kidney stones     Open  . WOUND DEBRIDEMENT Right 10/26/2015   Procedure: DEBRIDEMENT WOUND RIGHT FLANK;  Surgeon: Aviva Signs, MD;  Location: [facility];  Service: General;  Laterality: Right;    Current Outpatient Prescriptions  Medication Sig Dispense Refill  . aspirin EC 81 MG EC tablet Take 1 tablet (81 mg total) by mouth daily.    Marland Kitchen atorvastatin (LIPITOR) 40 MG tablet Take 1 tablet (40 mg total) by mouth daily. 90 tablet 3  . metoprolol tartrate (LOPRESSOR) 25 MG tablet Take 0.5 tablets (12.5 mg total) by mouth 2 (two) times daily. 45 tablet 11  . nitroGLYCERIN (NITROSTAT) 0.4 MG SL tablet Place 1 tablet (0.4 mg total) under the tongue every 5 (five) minutes x 3 doses as needed for chest pain. 25 tablet 12   No current facility-administered medications for this visit.    Allergies:  Codeine   Social History: The patient  reports that he quit smoking about 12 years ago. His smoking use included Cigars. He quit after 20.00 years of use. He has never used smokeless tobacco. He reports that he does not drink alcohol or use drugs.   ROS:  Please see the history of present illness. Otherwise, complete review of systems is positive for none.  All other systems are reviewed and negative.   Physical Exam:  VS:  BP (!) 149/82   Pulse 69   Ht 6' (1.829 m)   Wt 180 lb 12.8 oz (82 kg)   SpO2 96%   BMI 24.52 kg/m , BMI Body mass index is 24.52 kg/m.  Wt Readings from Last 3 Encounters:  02/05/16 180 lb 12.8 oz (82 kg)  10/20/15 176 lb (79.8 kg)  08/09/15 174 lb 2.6 oz (79 kg)    General: Patient appears comfortable at rest. HEENT: Conjunctiva and lids normal, oropharynx clear with moist mucosa. Neck: Supple, no elevated JVP or carotid bruits, no thyromegaly. Lungs: Clear to auscultation, nonlabored breathing at rest. Cardiac: Regular rate and rhythm, no S3 or significant systolic murmur, no pericardial rub. Abdomen:  Soft, nontender, no hepatomegaly, bowel sounds present, no guarding or rebound. Extremities: No pitting edema, distal pulses 2+.  ECG: I personally reviewed the tracing from 08/06/2015 which showed sinus rhythm with nonspecific ST changes.  Recent Labwork: 10/20/2015: BUN 19; Creatinine, Ser 1.34; Hemoglobin 12.4; Platelets 251; Potassium 4.3; Sodium 140     Component Value Date/Time   CHOL 176 08/14/2014 0730   TRIG 311 (H) 08/14/2014 0730   HDL 24 (L) 08/14/2014 0730   CHOLHDL 7.3 08/14/2014 0730   VLDL 62 (H) 08/14/2014 0730   LDLCALC 90 08/14/2014 0730    Other Studies Reviewed Today:  Cardiac catheterization 08/14/2014: Left main: Angiographically normal vessel which bifurcated into a large LAD and a large dominant left circumflex coronary artery.  LAD: LAD gave rise to 2 proximal diagonal vessels. Immediately after the takeoff of this second diagonal vessel. The LAD had 50-60% stenosis proximal to a septal perforating artery. The remainder of the LAD was free of significant disease and extended to the LV apex.  Left circumflex: Circumflex was large, dominant vessel that had a diffuse 80% stenosis in a small OM1 branch. The vessel supplied several additional marginal branches and ended in a posterolateral large caliber vessel.   Right coronary artery: Nondominant vessel that had total proximal occlusion with TIMI 0 flow initially.  Following PCI to the RCA, the very proximal RCA was opened. There was total occlusion of a marginal like branch just beyond the bifurcation of a smaller distal RCA branch. He said sites were dilated. There also is a distal 90% marginal branch stenosis which underwent PTCA to the vessel was small caliber and the distal 90% stenosis was reduced to 40%. Will RCA extending into this marginal vessel was dilated with the 2.2524 mm Synergy DES stent with entire region been reduced to 0% with brisk TIMI-3 flow.  Left ventriculography revealed preserved  LV contractility. Ejection fraction is 55%. There was no evidence for mitral regurgitation.  Assessment and Plan:  1. Symptomatically stable CAD status post DES to the RCA and PTCA to the RVM in February of last year. He is doing very well with regular exercise plan. No changes made for now.  2. Essential hypertension, no changes made to current regimen. Keep follow-up with Dr. Scotty Court.  3. Hyperlipidemia, on Lipitor. LDL 90 in February of last year. Due for follow-up lab work later this year.  Current medicines were reviewed with the patient today.  Disposition: Follow-up with me in 6 months.  Signed, Satira Sark, MD, [facility] 02/05/2016 3:27 PM    [facility], [place], [place] [address] Phone: [phone]; Fax: [phone]

## 2016-03-25 DIAGNOSIS — Z23 Encounter for immunization: Secondary | ICD-10-CM | POA: Diagnosis not present

## 2016-06-23 DIAGNOSIS — C44310 Basal cell carcinoma of skin of unspecified parts of face: Secondary | ICD-10-CM | POA: Diagnosis not present

## 2016-07-18 ENCOUNTER — Other Ambulatory Visit: Payer: Self-pay | Admitting: Cardiology

## 2016-07-18 MED ORDER — ATORVASTATIN CALCIUM 40 MG PO TABS: 40.0000 mg | ORAL_TABLET | Freq: Every day | ORAL | 3 refills | Status: DC

## 2016-07-18 NOTE — Telephone Encounter (Signed)
Patient contacted to get clarification on the name of the pharmacy Patient said the name of the pharmacy is called [facility].

## 2016-07-18 NOTE — Telephone Encounter (Signed)
atorvastatin (LIPITOR) 40 MG tablet   [facility]

## 2016-07-29 ENCOUNTER — Other Ambulatory Visit: Payer: Self-pay | Admitting: *Deleted

## 2016-07-29 MED ORDER — ATORVASTATIN CALCIUM 40 MG PO TABS: 40.0000 mg | ORAL_TABLET | Freq: Every day | ORAL | 0 refills | Status: AC

## 2016-08-09 ENCOUNTER — Encounter: Payer: Self-pay | Admitting: Cardiology

## 2016-08-09 ENCOUNTER — Ambulatory Visit (INDEPENDENT_AMBULATORY_CARE_PROVIDER_SITE_OTHER): Payer: PPO | Admitting: Cardiology

## 2016-08-09 VITALS — BP 138/90 | HR 64 | Ht 72.0 in | Wt 191.0 lb

## 2016-08-09 DIAGNOSIS — N183 Chronic kidney disease, stage 3 unspecified: Secondary | ICD-10-CM

## 2016-08-09 DIAGNOSIS — I2510 Atherosclerotic heart disease of native coronary artery without angina pectoris: Secondary | ICD-10-CM | POA: Diagnosis not present

## 2016-08-09 DIAGNOSIS — E782 Mixed hyperlipidemia: Secondary | ICD-10-CM

## 2016-08-09 DIAGNOSIS — I10 Essential (primary) hypertension: Secondary | ICD-10-CM | POA: Diagnosis not present

## 2016-08-09 NOTE — Patient Instructions (Signed)
Your physician wants you to follow-up in: 6 months with Dr. McDowell You will receive a reminder letter in the mail two months in advance. If you don't receive a letter, please call our office to schedule the follow-up appointment.  Your physician recommends that you continue on your current medications as directed. Please refer to the Current Medication list given to you today.  Thank you for choosing [facility]!!    

## 2016-08-09 NOTE — Progress Notes (Signed)
Cardiology Office Note  Date: 08/09/2016   ID: Charles Brooks, Charles Brooks 1950-08-08, MRN [id number]  PCP: Deloria Lair, MD  Primary Cardiologist: Rozann Lesches, MD   Chief Complaint  Patient presents with  . Coronary Artery Disease    History of Present Illness: TEVEN HEILEMAN is a 66 y.o. male last seen in August 2017. He presents for a follow-up visit. Reports no angina symptoms or nitroglycerin use. Has been less active over the winter, walking in his driveway for exercise. He reports NYHA class II dyspnea.  I reviewed his medications which are stable from a cardiac perspective outlined below. He has had interval follow-up with Dr. Scotty Court for lipids. Last creatinine was 1.3.  I reviewed his ECG today which shows sinus rhythm with diffuse nonspecific ST-T changes.  Past Medical History:  Diagnosis Date  . Arthritis   . CAD (coronary artery disease), native coronary artery    DES RCA and PTCA RVM 07/2014, LVEF 55%  . CKD (chronic kidney disease) stage 3, GFR 30-59 ml/min   . Hyperlipidemia   . Hypertension   . STEMI (ST elevation myocardial infarction) ([place])    07/2014    Past Surgical History:  Procedure Laterality Date  . CHOLECYSTECTOMY    . COLONOSCOPY WITH PROPOFOL N/A 11/08/2013   Procedure: COLONOSCOPY WITH PROPOFOL;  Surgeon: [name], MD;  Location: [facility];  Service: General;  Laterality: N/A;  in cecum at 0741; cecal withdrawal time = 10 min  . EXTRACORPOREAL SHOCK WAVE LITHOTRIPSY    . INCISION AND DRAINAGE ABSCESS Right 08/12/2015   Procedure: INCISION AND DRAINAGE ABSCESS;  Surgeon: Aviva Signs, MD;  Location: [facility];  Service: General;  Laterality: Right;  . KNEE ARTHROSCOPY Right   . LEFT HEART CATHETERIZATION WITH CORONARY ANGIOGRAM N/A 08/14/2014   Procedure: LEFT HEART CATHETERIZATION WITH CORONARY ANGIOGRAM;  Surgeon: Troy Sine, MD;  LAD 50-60%, CFX OK, OM1 80%(small), RCA 100>>0% w/ 2.25x24 mm Synergy DES, EF 55%  . POLYPECTOMY N/A 11/08/2013     Procedure: POLYPECTOMY;  Surgeon: [name], MD;  Location: [facility];  Service: General;  Laterality: N/A;  cecal polyp  . Removal of kidney stones     Open  . WOUND DEBRIDEMENT Right 10/26/2015   Procedure: DEBRIDEMENT WOUND RIGHT FLANK;  Surgeon: Aviva Signs, MD;  Location: [facility];  Service: General;  Laterality: Right;    Current Outpatient Prescriptions  Medication Sig Dispense Refill  . aspirin EC 81 MG EC tablet Take 1 tablet (81 mg total) by mouth daily.    Marland Kitchen atorvastatin (LIPITOR) 40 MG tablet Take 1 tablet (40 mg total) by mouth daily. 10 tablet 0  . metoprolol tartrate (LOPRESSOR) 25 MG tablet Take 0.5 tablets (12.5 mg total) by mouth 2 (two) times daily. 45 tablet 11  . nitroGLYCERIN (NITROSTAT) 0.4 MG SL tablet Place 1 tablet (0.4 mg total) under the tongue every 5 (five) minutes x 3 doses as needed for chest pain. 25 tablet 12   No current facility-administered medications for this visit.    Allergies:  Codeine   Social History: The patient  reports that he quit smoking about 12 years ago. His smoking use included Cigars. He quit after 20.00 years of use. He has never used smokeless tobacco. He reports that he does not drink alcohol or use drugs.   ROS:  Please see the history of present illness. Otherwise, complete review of systems is positive for none.  All other systems are reviewed and  negative.   Physical Exam: VS:  BP 138/90   Pulse 64   Ht 6' (1.829 m)   Wt 191 lb (86.6 kg)   SpO2 98%   BMI 25.90 kg/m , BMI Body mass index is 25.9 kg/m.  Wt Readings from Last 3 Encounters:  08/09/16 191 lb (86.6 kg)  02/05/16 180 lb 12.8 oz (82 kg)  10/20/15 176 lb (79.8 kg)    General: Patient appears comfortable at rest. HEENT: Conjunctiva and lids normal, oropharynx clear with moist mucosa. Neck: Supple, no elevated JVP or carotid bruits, no thyromegaly. Lungs: Clear to auscultation, nonlabored breathing at rest. Cardiac: Regular rate and rhythm, no S3 or  significant systolic murmur, no pericardial rub. Abdomen: Soft, nontender, no hepatomegaly, bowel sounds present, no guarding or rebound. Extremities: No pitting edema, distal pulses 2+. Skin: Warm and dry. Musculoskeletal: No kyphosis. Neuropsychiatric: Alert and oriented 3, affect appropriate.  ECG: I personally reviewed the tracing from 08/06/2015 which showed sinus rhythm with nonspecific ST changes.  Recent Labwork: 10/20/2015: BUN 19; Creatinine, Ser 1.34; Hemoglobin 12.4; Platelets 251; Potassium 4.3; Sodium 140     Component Value Date/Time   CHOL 176 08/14/2014 0730   TRIG 311 (H) 08/14/2014 0730   HDL 24 (L) 08/14/2014 0730   CHOLHDL 7.3 08/14/2014 0730   VLDL 62 (H) 08/14/2014 0730   LDLCALC 90 08/14/2014 0730    Other Studies Reviewed Today:  Cardiac catheterization 08/14/2014: Left main: Angiographically normal vessel which bifurcated into a large LAD and a large dominant left circumflex coronary artery.  LAD: LAD gave rise to 2 proximal diagonal vessels. Immediately after the takeoff of this second diagonal vessel. The LAD had 50-60% stenosis proximal to a septal perforating artery. The remainder of the LAD was free of significant disease and extended to the LV apex.  Left circumflex: Circumflex was large, dominant vessel that had a diffuse 80% stenosis in a small OM1 branch. The vessel supplied several additional marginal branches and ended in a posterolateral large caliber vessel.   Right coronary artery: Nondominant vessel that had total proximal occlusion with TIMI 0 flow initially.  Following PCI to the RCA, the very proximal RCA was opened. There was total occlusion of a marginal like branch just beyond the bifurcation of a smaller distal RCA branch. He said sites were dilated. There also is a distal 90% marginal branch stenosis which underwent PTCA to the vessel was small caliber and the distal 90% stenosis was reduced to 40%. Will RCA extending into this  marginal vessel was dilated with the 2.2524 mm Synergy DES stent with entire region been reduced to 0% with brisk TIMI-3 flow.  Left ventriculography revealed preserved LV contractility. Ejection fraction is 55%. There was no evidence for mitral regurgitation.  Assessment and Plan:  1. Symptomatically stable CAD status post DES to the RCA and angioplasty of the RV marginal in February 2016 in the setting of STEMI. We discussed getting back to his regular walking regimen when the weather warms up. No changes in medical therapy at this time.  2. Essential hypertension, on Lopressor. Diastolic mildly elevated. Reviewed sodium restriction guidelines and exercise plan.  3. CKD stage 3, last creatinine 1.3.  4. Hyperlipidemia, continues on Lipitor. Follows lipids yearly with Dr. Scotty Court.  Current medicines were reviewed with the patient today.   Orders Placed This Encounter  Procedures  . EKG 12-Lead    Disposition: Follow-up in 6 months.  Signed, Satira Sark, MD, [facility] 08/09/2016 8:04 AM  [facility] [place], [place], [place] [address] Phone: [phone]; Fax: [phone]

## 2016-10-05 DIAGNOSIS — C44619 Basal cell carcinoma of skin of left upper limb, including shoulder: Secondary | ICD-10-CM | POA: Diagnosis not present

## 2016-10-05 DIAGNOSIS — C44319 Basal cell carcinoma of skin of other parts of face: Secondary | ICD-10-CM | POA: Diagnosis not present

## 2016-10-05 DIAGNOSIS — D225 Melanocytic nevi of trunk: Secondary | ICD-10-CM | POA: Diagnosis not present

## 2016-10-05 DIAGNOSIS — C44119 Basal cell carcinoma of skin of left eyelid, including canthus: Secondary | ICD-10-CM | POA: Diagnosis not present

## 2016-10-27 DIAGNOSIS — Z08 Encounter for follow-up examination after completed treatment for malignant neoplasm: Secondary | ICD-10-CM | POA: Diagnosis not present

## 2016-10-27 DIAGNOSIS — C44519 Basal cell carcinoma of skin of other part of trunk: Secondary | ICD-10-CM | POA: Diagnosis not present

## 2016-10-27 DIAGNOSIS — C4441 Basal cell carcinoma of skin of scalp and neck: Secondary | ICD-10-CM | POA: Diagnosis not present

## 2016-10-27 DIAGNOSIS — Z85828 Personal history of other malignant neoplasm of skin: Secondary | ICD-10-CM | POA: Diagnosis not present

## 2016-11-24 DIAGNOSIS — Z08 Encounter for follow-up examination after completed treatment for malignant neoplasm: Secondary | ICD-10-CM | POA: Diagnosis not present

## 2016-11-24 DIAGNOSIS — Z85828 Personal history of other malignant neoplasm of skin: Secondary | ICD-10-CM | POA: Diagnosis not present

## 2016-11-24 DIAGNOSIS — L905 Scar conditions and fibrosis of skin: Secondary | ICD-10-CM | POA: Diagnosis not present

## 2016-11-24 DIAGNOSIS — D485 Neoplasm of uncertain behavior of skin: Secondary | ICD-10-CM | POA: Diagnosis not present

## 2016-11-25 DIAGNOSIS — I10 Essential (primary) hypertension: Secondary | ICD-10-CM | POA: Diagnosis not present

## 2016-11-25 DIAGNOSIS — K439 Ventral hernia without obstruction or gangrene: Secondary | ICD-10-CM | POA: Diagnosis not present

## 2016-11-25 DIAGNOSIS — Z86010 Personal history of colonic polyps: Secondary | ICD-10-CM | POA: Diagnosis not present

## 2016-11-25 DIAGNOSIS — E784 Other hyperlipidemia: Secondary | ICD-10-CM | POA: Diagnosis not present

## 2016-11-25 DIAGNOSIS — I2510 Atherosclerotic heart disease of native coronary artery without angina pectoris: Secondary | ICD-10-CM | POA: Diagnosis not present

## 2016-11-25 DIAGNOSIS — Z6826 Body mass index (BMI) 26.0-26.9, adult: Secondary | ICD-10-CM | POA: Diagnosis not present

## 2016-11-25 DIAGNOSIS — Z0000 Encounter for general adult medical examination without abnormal findings: Secondary | ICD-10-CM | POA: Diagnosis not present

## 2016-12-26 DIAGNOSIS — D045 Carcinoma in situ of skin of trunk: Secondary | ICD-10-CM | POA: Diagnosis not present

## 2016-12-26 DIAGNOSIS — L905 Scar conditions and fibrosis of skin: Secondary | ICD-10-CM | POA: Diagnosis not present

## 2017-02-03 NOTE — Progress Notes (Signed)
Cardiology Office Note  Date: 02/06/2017   ID: Masaji, Billups March 06, 1951, MRN [id number]  PCP: Deloria Lair., MD  Primary Cardiologist: Rozann Lesches, MD   Chief Complaint  Patient presents with  . Coronary Artery Disease    History of Present Illness: Charles Brooks is a 66 y.o. male last seen in February. He presents for a routine follow-up visit. States that he has been doing fairly well, no angina symptoms on medical therapy. He is limited by arthritic leg pain but still does yard work and other ADLs.  He continues to follow with Dr. Scotty Court and had recent lab work with physical. We are requesting the results.  I reviewed his medications which are outlined below and stable from a cardiac perspective. He states that he has not had to use any nitroglycerin.  Cardiac catheterization from 2016 is reviewed below. We continue observation in the absence of progressive symptoms.  Past Medical History:  Diagnosis Date  . Arthritis   . CAD (coronary artery disease), native coronary artery    DES RCA and PTCA RVM 07/2014, LVEF 55%  . CKD (chronic kidney disease) stage 3, GFR 30-59 ml/min   . Hyperlipidemia   . Hypertension   . STEMI (ST elevation myocardial infarction) ([place])    07/2014    Past Surgical History:  Procedure Laterality Date  . CHOLECYSTECTOMY    . COLONOSCOPY WITH PROPOFOL N/A 11/08/2013   Procedure: COLONOSCOPY WITH PROPOFOL;  Surgeon: [name], MD;  Location: [facility];  Service: General;  Laterality: N/A;  in cecum at 0741; cecal withdrawal time = 10 min  . EXTRACORPOREAL SHOCK WAVE LITHOTRIPSY    . INCISION AND DRAINAGE ABSCESS Right 08/12/2015   Procedure: INCISION AND DRAINAGE ABSCESS;  Surgeon: Aviva Signs, MD;  Location: [facility];  Service: General;  Laterality: Right;  . KNEE ARTHROSCOPY Right   . LEFT HEART CATHETERIZATION WITH CORONARY ANGIOGRAM N/A 08/14/2014   Procedure: LEFT HEART CATHETERIZATION WITH CORONARY ANGIOGRAM;  Surgeon: Troy Sine, MD;  LAD 50-60%, CFX OK, OM1 80%(small), RCA 100>>0% w/ 2.25x24 mm Synergy DES, EF 55%  . POLYPECTOMY N/A 11/08/2013   Procedure: POLYPECTOMY;  Surgeon: [name], MD;  Location: [facility];  Service: General;  Laterality: N/A;  cecal polyp  . Removal of kidney stones     Open  . WOUND DEBRIDEMENT Right 10/26/2015   Procedure: DEBRIDEMENT WOUND RIGHT FLANK;  Surgeon: Aviva Signs, MD;  Location: [facility];  Service: General;  Laterality: Right;    Current Outpatient Prescriptions  Medication Sig Dispense Refill  . aspirin EC 81 MG EC tablet Take 1 tablet (81 mg total) by mouth daily.    Marland Kitchen atorvastatin (LIPITOR) 40 MG tablet Take 1 tablet (40 mg total) by mouth daily. 10 tablet 0  . metoprolol tartrate (LOPRESSOR) 25 MG tablet Take 0.5 tablets (12.5 mg total) by mouth 2 (two) times daily. 45 tablet 11  . nitroGLYCERIN (NITROSTAT) 0.4 MG SL tablet Place 1 tablet (0.4 mg total) under the tongue every 5 (five) minutes x 3 doses as needed for chest pain. 25 tablet 12   No current facility-administered medications for this visit.    Allergies:  Codeine   Social History: The patient  reports that he quit smoking about 13 years ago. His smoking use included Cigars. He quit after 20.00 years of use. He has never used smokeless tobacco. He reports that he does not drink alcohol or use drugs.   ROS:  Please see the history of present illness. Otherwise, complete review of systems is positive for arthritic leg pain.  All other systems are reviewed and negative.   Physical Exam: VS:  BP (!) 148/88   Pulse 92   Ht 6' (1.829 m)   Wt 188 lb (85.3 kg)   SpO2 98%   BMI 25.50 kg/m , BMI Body mass index is 25.5 kg/m.  Wt Readings from Last 3 Encounters:  02/06/17 188 lb (85.3 kg)  08/09/16 191 lb (86.6 kg)  02/05/16 180 lb 12.8 oz (82 kg)    General: Elderly male, appears comfortable at rest. HEENT: Conjunctiva and lids normal, oropharynx clear. Neck: Supple, no elevated JVP or carotid bruits,  no thyromegaly. Lungs: Clear to auscultation, nonlabored breathing at rest. Cardiac: Regular rate and rhythm, no S3 or significant systolic murmur, no pericardial rub. Abdomen: Soft, nontender, bowel sounds present. Extremities: No pitting edema, distal pulses 2+. Skin: Warm and dry. Musculoskeletal: No kyphosis. Neuropsychiatric: Alert and oriented 3, affect appropriate.  ECG: I personally reviewed the tracing from 08/09/2016 which showed sinus rhythm with nonspecific ST-T changes.  Recent Labwork:    Component Value Date/Time   CHOL 176 08/14/2014 0730   TRIG 311 (H) 08/14/2014 0730   HDL 24 (L) 08/14/2014 0730   CHOLHDL 7.3 08/14/2014 0730   VLDL 62 (H) 08/14/2014 0730   LDLCALC 90 08/14/2014 0730  May 2017: Hemoglobin 12.4, platelets 251, potassium 4.3, BUN 19, creatinine 1.34  Other Studies Reviewed Today:  Cardiac catheterization 08/14/2014: Left main: Angiographically normal vessel which bifurcated into a large LAD and a large dominant left circumflex coronary artery.  LAD: LAD gave rise to 2 proximal diagonal vessels. Immediately after the takeoff of this second diagonal vessel. The LAD had 50-60% stenosis proximal to a septal perforating artery. The remainder of the LAD was free of significant disease and extended to the LV apex.  Left circumflex: Circumflex was large, dominant vessel that had a diffuse 80% stenosis in a small OM1 branch. The vessel supplied several additional marginal branches and ended in a posterolateral large caliber vessel.   Right coronary artery: Nondominant vessel that had total proximal occlusion with TIMI 0 flow initially.  Following PCI to the RCA, the very proximal RCA was opened. There was total occlusion of a marginal like branch just beyond the bifurcation of a smaller distal RCA branch. He said sites were dilated. There also is a distal 90% marginal branch stenosis which underwent PTCA to the vessel was small caliber and the distal  90% stenosis was reduced to 40%. Will RCA extending into this marginal vessel was dilated with the 2.2524 mm Synergy DES stent with entire region been reduced to 0% with brisk TIMI-3 flow.  Left ventriculography revealed preserved LV contractility. Ejection fraction is 55%. There was no evidence for mitral regurgitation.  Assessment and Plan:  1. CAD status post DES to the RCA and angioplasty of the RV marginal branch back in 2016. He continues to do well without angina symptoms on medical therapy. Continue observation.  2. Essential hypertension, continues on Lopressor. Keep follow-up with Dr. Scotty Court.  3. Hyperlipidemia, remains on statin therapy. Requesting recent lab work from Dr. Scotty Court.  4. CKD stage III, last creatinine 1.3. Requesting recent lab work from Dr. Scotty Court.  Current medicines were reviewed with the patient today.  Disposition: Follow-up in 6 months.  Signed, Satira Sark, MD, [facility] 02/06/2017 11:11 AM    [facility] [place]  [address] [place], [place] [address] Phone: [phone]; Fax: [phone]

## 2017-02-06 ENCOUNTER — Encounter: Payer: Self-pay | Admitting: Cardiology

## 2017-02-06 ENCOUNTER — Ambulatory Visit (INDEPENDENT_AMBULATORY_CARE_PROVIDER_SITE_OTHER): Payer: PPO | Admitting: Cardiology

## 2017-02-06 VITALS — BP 148/88 | HR 92 | Ht 72.0 in | Wt 188.0 lb

## 2017-02-06 DIAGNOSIS — I10 Essential (primary) hypertension: Secondary | ICD-10-CM | POA: Diagnosis not present

## 2017-02-06 DIAGNOSIS — N183 Chronic kidney disease, stage 3 unspecified: Secondary | ICD-10-CM

## 2017-02-06 DIAGNOSIS — I2510 Atherosclerotic heart disease of native coronary artery without angina pectoris: Secondary | ICD-10-CM | POA: Diagnosis not present

## 2017-02-06 DIAGNOSIS — E782 Mixed hyperlipidemia: Secondary | ICD-10-CM

## 2017-02-06 NOTE — Patient Instructions (Signed)

## 2017-02-23 DIAGNOSIS — Z08 Encounter for follow-up examination after completed treatment for malignant neoplasm: Secondary | ICD-10-CM | POA: Diagnosis not present

## 2017-02-23 DIAGNOSIS — D0439 Carcinoma in situ of skin of other parts of face: Secondary | ICD-10-CM | POA: Diagnosis not present

## 2017-02-23 DIAGNOSIS — X32XXXD Exposure to sunlight, subsequent encounter: Secondary | ICD-10-CM | POA: Diagnosis not present

## 2017-02-23 DIAGNOSIS — C4441 Basal cell carcinoma of skin of scalp and neck: Secondary | ICD-10-CM | POA: Diagnosis not present

## 2017-02-23 DIAGNOSIS — L570 Actinic keratosis: Secondary | ICD-10-CM | POA: Diagnosis not present

## 2017-02-23 DIAGNOSIS — Z85828 Personal history of other malignant neoplasm of skin: Secondary | ICD-10-CM | POA: Diagnosis not present

## 2017-03-22 DIAGNOSIS — Z23 Encounter for immunization: Secondary | ICD-10-CM | POA: Diagnosis not present

## 2017-09-12 DIAGNOSIS — Z299 Encounter for prophylactic measures, unspecified: Secondary | ICD-10-CM | POA: Diagnosis not present

## 2017-09-12 DIAGNOSIS — Z87891 Personal history of nicotine dependence: Secondary | ICD-10-CM | POA: Diagnosis not present

## 2017-09-12 DIAGNOSIS — Z6825 Body mass index (BMI) 25.0-25.9, adult: Secondary | ICD-10-CM | POA: Diagnosis not present

## 2017-09-12 DIAGNOSIS — I10 Essential (primary) hypertension: Secondary | ICD-10-CM | POA: Diagnosis not present

## 2017-09-12 DIAGNOSIS — E7800 Pure hypercholesterolemia, unspecified: Secondary | ICD-10-CM | POA: Diagnosis not present

## 2017-09-12 DIAGNOSIS — M1990 Unspecified osteoarthritis, unspecified site: Secondary | ICD-10-CM | POA: Diagnosis not present

## 2017-09-12 DIAGNOSIS — I2510 Atherosclerotic heart disease of native coronary artery without angina pectoris: Secondary | ICD-10-CM | POA: Diagnosis not present

## 2017-12-01 DIAGNOSIS — R5383 Other fatigue: Secondary | ICD-10-CM | POA: Diagnosis not present

## 2017-12-01 DIAGNOSIS — Z299 Encounter for prophylactic measures, unspecified: Secondary | ICD-10-CM | POA: Diagnosis not present

## 2017-12-01 DIAGNOSIS — Z0000 Encounter for general adult medical examination without abnormal findings: Secondary | ICD-10-CM | POA: Diagnosis not present

## 2017-12-01 DIAGNOSIS — Z1211 Encounter for screening for malignant neoplasm of colon: Secondary | ICD-10-CM | POA: Diagnosis not present

## 2017-12-01 DIAGNOSIS — Z6825 Body mass index (BMI) 25.0-25.9, adult: Secondary | ICD-10-CM | POA: Diagnosis not present

## 2017-12-01 DIAGNOSIS — Z1331 Encounter for screening for depression: Secondary | ICD-10-CM | POA: Diagnosis not present

## 2017-12-01 DIAGNOSIS — I10 Essential (primary) hypertension: Secondary | ICD-10-CM | POA: Diagnosis not present

## 2017-12-01 DIAGNOSIS — Z1339 Encounter for screening examination for other mental health and behavioral disorders: Secondary | ICD-10-CM | POA: Diagnosis not present

## 2017-12-01 DIAGNOSIS — Z7189 Other specified counseling: Secondary | ICD-10-CM | POA: Diagnosis not present

## 2017-12-04 DIAGNOSIS — R5383 Other fatigue: Secondary | ICD-10-CM | POA: Diagnosis not present

## 2017-12-04 DIAGNOSIS — Z79899 Other long term (current) drug therapy: Secondary | ICD-10-CM | POA: Diagnosis not present

## 2017-12-04 DIAGNOSIS — Z125 Encounter for screening for malignant neoplasm of prostate: Secondary | ICD-10-CM | POA: Diagnosis not present

## 2017-12-04 DIAGNOSIS — E7800 Pure hypercholesterolemia, unspecified: Secondary | ICD-10-CM | POA: Diagnosis not present

## 2017-12-19 DIAGNOSIS — E875 Hyperkalemia: Secondary | ICD-10-CM | POA: Diagnosis not present

## 2018-03-07 DIAGNOSIS — I10 Essential (primary) hypertension: Secondary | ICD-10-CM | POA: Diagnosis not present

## 2018-03-07 DIAGNOSIS — Z713 Dietary counseling and surveillance: Secondary | ICD-10-CM | POA: Diagnosis not present

## 2018-03-07 DIAGNOSIS — Z299 Encounter for prophylactic measures, unspecified: Secondary | ICD-10-CM | POA: Diagnosis not present

## 2018-03-07 DIAGNOSIS — E7800 Pure hypercholesterolemia, unspecified: Secondary | ICD-10-CM | POA: Diagnosis not present

## 2018-03-07 DIAGNOSIS — Z6824 Body mass index (BMI) 24.0-24.9, adult: Secondary | ICD-10-CM | POA: Diagnosis not present

## 2018-04-18 DIAGNOSIS — Z6824 Body mass index (BMI) 24.0-24.9, adult: Secondary | ICD-10-CM | POA: Diagnosis not present

## 2018-04-18 DIAGNOSIS — I10 Essential (primary) hypertension: Secondary | ICD-10-CM | POA: Diagnosis not present

## 2018-04-18 DIAGNOSIS — Z299 Encounter for prophylactic measures, unspecified: Secondary | ICD-10-CM | POA: Diagnosis not present

## 2018-04-18 DIAGNOSIS — E7800 Pure hypercholesterolemia, unspecified: Secondary | ICD-10-CM | POA: Diagnosis not present

## 2018-05-30 DIAGNOSIS — Z299 Encounter for prophylactic measures, unspecified: Secondary | ICD-10-CM | POA: Diagnosis not present

## 2018-05-30 DIAGNOSIS — I10 Essential (primary) hypertension: Secondary | ICD-10-CM | POA: Diagnosis not present

## 2018-05-30 DIAGNOSIS — Z6824 Body mass index (BMI) 24.0-24.9, adult: Secondary | ICD-10-CM | POA: Diagnosis not present

## 2018-06-29 ENCOUNTER — Ambulatory Visit: Payer: PPO | Admitting: Cardiology

## 2018-06-29 ENCOUNTER — Encounter

## 2018-06-29 ENCOUNTER — Encounter: Payer: Self-pay | Admitting: Cardiology

## 2018-06-29 ENCOUNTER — Encounter: Payer: Self-pay | Admitting: *Deleted

## 2018-06-29 VITALS — BP 152/72 | Ht 72.0 in | Wt 186.0 lb

## 2018-06-29 DIAGNOSIS — N183 Chronic kidney disease, stage 3 unspecified: Secondary | ICD-10-CM

## 2018-06-29 DIAGNOSIS — E782 Mixed hyperlipidemia: Secondary | ICD-10-CM | POA: Diagnosis not present

## 2018-06-29 DIAGNOSIS — I10 Essential (primary) hypertension: Secondary | ICD-10-CM

## 2018-06-29 DIAGNOSIS — I25119 Atherosclerotic heart disease of native coronary artery with unspecified angina pectoris: Secondary | ICD-10-CM | POA: Diagnosis not present

## 2018-06-29 NOTE — Patient Instructions (Addendum)

## 2018-06-29 NOTE — Progress Notes (Signed)
Cardiology Office Note  Date: 06/29/2018   ID: Charles Brooks, Charles Brooks 06/01/1951, MRN [id number]  PCP: Charles Chroman, Brooks  Primary Cardiologist: Charles Lesches, Brooks   Chief Complaint  Patient presents with  . Coronary Artery Disease    History of Present Illness: Charles Brooks is a 68 y.o. male last seen in August 2018.  He presents overdue for follow-up.  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  He does not report any active angina symptoms or nitroglycerin use, states that he has been taking his medications regularly.  We went over his medications which are outlined below.  He is now following with Dr. Woody Brooks for primary care after Charles Brooks retirement.  Requesting interval lab work.  I personally reviewed his ECG today which shows sinus rhythm with nonspecific ST changes.  Past Medical History:  Diagnosis Date  . Arthritis   . CAD (coronary artery disease), native coronary artery    DES RCA and PTCA RVM 07/2014, LVEF 55%  . CKD (chronic kidney disease) stage 3, GFR 30-59 ml/min ([facility])   . Hyperlipidemia   . Hypertension   . STEMI (ST elevation myocardial infarction) ([place])    07/2014    Past Surgical History:  Procedure Laterality Date  . CHOLECYSTECTOMY    . COLONOSCOPY WITH PROPOFOL N/A 11/08/2013   Procedure: COLONOSCOPY WITH PROPOFOL;  Surgeon: Charles So, Brooks;  Location: [facility];  Service: General;  Laterality: N/A;  in cecum at 0741; cecal withdrawal time = 10 min  . EXTRACORPOREAL SHOCK WAVE LITHOTRIPSY    . INCISION AND DRAINAGE ABSCESS Right 08/12/2015   Procedure: INCISION AND DRAINAGE ABSCESS;  Surgeon: Charles Brooks;  Location: [facility];  Service: General;  Laterality: Right;  . KNEE ARTHROSCOPY Right   . LEFT HEART CATHETERIZATION WITH CORONARY ANGIOGRAM N/A 08/14/2014   Procedure: LEFT HEART CATHETERIZATION WITH CORONARY ANGIOGRAM;  Surgeon: Charles Sine, Brooks;   LAD 50-60%, CFX OK, OM1 80%(small), RCA 100>>0% w/ 2.25x24 mm Synergy DES, EF 55%  . POLYPECTOMY N/A 11/08/2013   Procedure: POLYPECTOMY;  Surgeon: Charles So, Brooks;  Location: [facility];  Service: General;  Laterality: N/A;  cecal polyp  . Removal of kidney stones     Open  . WOUND DEBRIDEMENT Right 10/26/2015   Procedure: DEBRIDEMENT WOUND RIGHT FLANK;  Surgeon: Charles Brooks;  Location: [facility];  Service: General;  Laterality: Right;    Current Outpatient Medications  Medication Sig Dispense Refill  . aspirin EC 81 MG EC tablet Take 1 tablet (81 mg total) by mouth daily.    Marland Kitchen atorvastatin (LIPITOR) 40 MG tablet Take 1 tablet (40 mg total) by mouth daily. 10 tablet 0  . metoprolol tartrate (LOPRESSOR) 25 MG tablet Take 0.5 tablets (12.5 mg total) by mouth 2 (two) times daily. 45 tablet 11  . nitroGLYCERIN (NITROSTAT) 0.4 MG SL tablet Place 1 tablet (0.4 mg total) under the tongue every 5 (five) minutes x 3 doses as needed for chest pain. 25 tablet 12   No current facility-administered medications for this visit.    Allergies:  Codeine   Social History: The patient  reports that he quit smoking about 14 years ago. His smoking use included cigars. He quit after 20.00 years of use. He has never used smokeless tobacco. He reports that he does not drink alcohol or use drugs.  ROS:  Please see the history of present illness. Otherwise, complete review of systems is positive for none.  All other systems are reviewed and negative.   Physical Exam: VS:  BP (!) 152/72   Ht 6' (1.829 m)   Wt 186 lb (84.4 kg)   SpO2 98%   BMI 25.23 kg/m , BMI Body mass index is 25.23 kg/m.  Wt Readings from Last 3 Encounters:  06/29/18 186 lb (84.4 kg)  02/06/17 188 lb (85.3 kg)  08/09/16 191 lb (86.6 kg)    General: Patient appears comfortable at rest. HEENT: Conjunctiva and lids normal, oropharynx clear. Lungs: Clear to auscultation, nonlabored breathing at rest. Cardiac: Regular rate and rhythm, no  S3 or significant systolic murmur. Abdomen: Soft, nontender, bowel sounds present. Extremities: No pitting edema, distal pulses 2+. Skin: Warm and dry. Musculoskeletal: No kyphosis. Neuropsychiatric: Alert and oriented x3, affect grossly appropriate.  ECG: I personally reviewed the tracing from 08/09/2016 which showed sinus rhythm with nonspecific ST-T changes.  Recent Labwork:  June 2018: BUN 15, creatinine 1.49, potassium 4.6, AST 17, ALT 15, cholesterol 118, HDL 31, LDL 55, triglycerides 160, hemoglobin 13.8, platelets 213  Other Studies Reviewed Today:  Cardiac catheterization 08/14/2014: Left main: Angiographically normal vessel which bifurcated into a large LAD and a large dominant left circumflex coronary artery.  LAD: LAD gave rise to 2 proximal diagonal vessels. Immediately after the takeoff of this second diagonal vessel. The LAD had 50-60% stenosis proximal to a septal perforating artery. The remainder of the LAD was free of significant disease and extended to the LV apex.  Left circumflex: Circumflex was large, dominant vessel that had a diffuse 80% stenosis in a small OM1 branch. The vessel supplied several additional marginal branches and ended in a posterolateral large caliber vessel.   Right coronary artery: Nondominant vessel that had total proximal occlusion with TIMI 0 flow initially.  Following PCI to the RCA, the very proximal RCA was opened. There was total occlusion of a marginal like branch just beyond the bifurcation of a smaller distal RCA branch. He said sites were dilated. There also is a distal 90% marginal branch stenosis which underwent PTCA to the vessel was small caliber and the distal 90% stenosis was reduced to 40%. Will RCA extending into this marginal vessel was dilated with the 2.2524 mm Synergy DES stent with entire region been reduced to 0% with brisk TIMI-3 flow.  Left ventriculography revealed preserved LV contractility. Ejection  fraction is 55%. There was no evidence for mitral regurgitation.  Assessment and Plan:  1.  CAD status post DES to the RCA and angioplasty of the RV marginal branch in 2016.  He remains stable without recurrent angina on medical therapy and we plan to continue observation for now.  I reviewed his ECG.  2.  Essential hypertension, blood pressure is mildly elevated today.  He reports compliance with his medications.  Keep follow-up with Dr. Woody Brooks.  3.  Mixed hyperlipidemia, continues on Lipitor.  Requesting interval lab work.  4.  CKD stage 3, requesting interval lab work.  Last creatinine was 1.49.  Current medicines were reviewed with the patient today.   Orders Placed This Encounter  Procedures  . EKG 12-Lead    Disposition: Follow-up in 1 year, sooner if needed.  Signed, Satira Sark, Brooks, [facility] 06/29/2018 3:44 PM    [facility] [place], [place], [place] [address] Phone: [phone]; Fax: [phone]

## 2018-08-09 ENCOUNTER — Ambulatory Visit: Payer: PPO | Admitting: Cardiology

## 2018-08-15 DIAGNOSIS — Z299 Encounter for prophylactic measures, unspecified: Secondary | ICD-10-CM | POA: Diagnosis not present

## 2018-08-15 DIAGNOSIS — I10 Essential (primary) hypertension: Secondary | ICD-10-CM | POA: Diagnosis not present

## 2018-08-15 DIAGNOSIS — Z6825 Body mass index (BMI) 25.0-25.9, adult: Secondary | ICD-10-CM | POA: Diagnosis not present

## 2018-10-24 DIAGNOSIS — Z713 Dietary counseling and surveillance: Secondary | ICD-10-CM | POA: Diagnosis not present

## 2018-10-24 DIAGNOSIS — Z6824 Body mass index (BMI) 24.0-24.9, adult: Secondary | ICD-10-CM | POA: Diagnosis not present

## 2018-10-24 DIAGNOSIS — Z299 Encounter for prophylactic measures, unspecified: Secondary | ICD-10-CM | POA: Diagnosis not present

## 2018-10-24 DIAGNOSIS — I10 Essential (primary) hypertension: Secondary | ICD-10-CM | POA: Diagnosis not present

## 2018-10-24 DIAGNOSIS — S61419A Laceration without foreign body of unspecified hand, initial encounter: Secondary | ICD-10-CM | POA: Diagnosis not present

## 2018-12-07 DIAGNOSIS — R5383 Other fatigue: Secondary | ICD-10-CM | POA: Diagnosis not present

## 2018-12-07 DIAGNOSIS — Z6824 Body mass index (BMI) 24.0-24.9, adult: Secondary | ICD-10-CM | POA: Diagnosis not present

## 2018-12-07 DIAGNOSIS — I10 Essential (primary) hypertension: Secondary | ICD-10-CM | POA: Diagnosis not present

## 2018-12-07 DIAGNOSIS — Z0000 Encounter for general adult medical examination without abnormal findings: Secondary | ICD-10-CM | POA: Diagnosis not present

## 2018-12-07 DIAGNOSIS — Z1339 Encounter for screening examination for other mental health and behavioral disorders: Secondary | ICD-10-CM | POA: Diagnosis not present

## 2018-12-07 DIAGNOSIS — Z7189 Other specified counseling: Secondary | ICD-10-CM | POA: Diagnosis not present

## 2018-12-07 DIAGNOSIS — Z1211 Encounter for screening for malignant neoplasm of colon: Secondary | ICD-10-CM | POA: Diagnosis not present

## 2018-12-07 DIAGNOSIS — Z79899 Other long term (current) drug therapy: Secondary | ICD-10-CM | POA: Diagnosis not present

## 2018-12-07 DIAGNOSIS — Z125 Encounter for screening for malignant neoplasm of prostate: Secondary | ICD-10-CM | POA: Diagnosis not present

## 2018-12-07 DIAGNOSIS — Z1331 Encounter for screening for depression: Secondary | ICD-10-CM | POA: Diagnosis not present

## 2018-12-07 DIAGNOSIS — Z299 Encounter for prophylactic measures, unspecified: Secondary | ICD-10-CM | POA: Diagnosis not present

## 2018-12-07 DIAGNOSIS — E7800 Pure hypercholesterolemia, unspecified: Secondary | ICD-10-CM | POA: Diagnosis not present

## 2019-01-29 DIAGNOSIS — E7800 Pure hypercholesterolemia, unspecified: Secondary | ICD-10-CM | POA: Diagnosis not present

## 2019-01-29 DIAGNOSIS — I10 Essential (primary) hypertension: Secondary | ICD-10-CM | POA: Diagnosis not present

## 2019-03-12 DIAGNOSIS — Z6824 Body mass index (BMI) 24.0-24.9, adult: Secondary | ICD-10-CM | POA: Diagnosis not present

## 2019-03-12 DIAGNOSIS — Z299 Encounter for prophylactic measures, unspecified: Secondary | ICD-10-CM | POA: Diagnosis not present

## 2019-03-12 DIAGNOSIS — Z713 Dietary counseling and surveillance: Secondary | ICD-10-CM | POA: Diagnosis not present

## 2019-03-12 DIAGNOSIS — I10 Essential (primary) hypertension: Secondary | ICD-10-CM | POA: Diagnosis not present

## 2019-04-09 DIAGNOSIS — I10 Essential (primary) hypertension: Secondary | ICD-10-CM | POA: Diagnosis not present

## 2019-04-09 DIAGNOSIS — E7800 Pure hypercholesterolemia, unspecified: Secondary | ICD-10-CM | POA: Diagnosis not present

## 2019-05-06 DIAGNOSIS — I10 Essential (primary) hypertension: Secondary | ICD-10-CM | POA: Diagnosis not present

## 2019-05-06 DIAGNOSIS — E7800 Pure hypercholesterolemia, unspecified: Secondary | ICD-10-CM | POA: Diagnosis not present

## 2019-06-03 DIAGNOSIS — E7800 Pure hypercholesterolemia, unspecified: Secondary | ICD-10-CM | POA: Diagnosis not present

## 2019-06-03 DIAGNOSIS — I10 Essential (primary) hypertension: Secondary | ICD-10-CM | POA: Diagnosis not present

## 2019-06-17 DIAGNOSIS — Z713 Dietary counseling and surveillance: Secondary | ICD-10-CM | POA: Diagnosis not present

## 2019-06-17 DIAGNOSIS — Z6824 Body mass index (BMI) 24.0-24.9, adult: Secondary | ICD-10-CM | POA: Diagnosis not present

## 2019-06-17 DIAGNOSIS — Z299 Encounter for prophylactic measures, unspecified: Secondary | ICD-10-CM | POA: Diagnosis not present

## 2019-06-17 DIAGNOSIS — Z87891 Personal history of nicotine dependence: Secondary | ICD-10-CM | POA: Diagnosis not present

## 2019-06-17 DIAGNOSIS — I10 Essential (primary) hypertension: Secondary | ICD-10-CM | POA: Diagnosis not present

## 2019-07-05 ENCOUNTER — Emergency Department (HOSPITAL_COMMUNITY): Payer: PPO

## 2019-07-05 ENCOUNTER — Encounter (HOSPITAL_COMMUNITY): Payer: Self-pay | Admitting: Emergency Medicine

## 2019-07-05 ENCOUNTER — Inpatient Hospital Stay (HOSPITAL_COMMUNITY)
Admission: EM | Admit: 2019-07-05 | Discharge: 2019-07-07 | DRG: 682 | Disposition: A | Discharge status: Home / Self Care | Payer: PPO | Attending: Family Medicine | Admitting: Family Medicine

## 2019-07-05 ENCOUNTER — Other Ambulatory Visit: Payer: Self-pay

## 2019-07-05 DIAGNOSIS — N183 Chronic kidney disease, stage 3 unspecified: Secondary | ICD-10-CM | POA: Diagnosis present

## 2019-07-05 DIAGNOSIS — I252 Old myocardial infarction: Secondary | ICD-10-CM

## 2019-07-05 DIAGNOSIS — N179 Acute kidney failure, unspecified: Principal | ICD-10-CM | POA: Diagnosis present

## 2019-07-05 DIAGNOSIS — K529 Noninfective gastroenteritis and colitis, unspecified: Secondary | ICD-10-CM

## 2019-07-05 DIAGNOSIS — E785 Hyperlipidemia, unspecified: Secondary | ICD-10-CM

## 2019-07-05 DIAGNOSIS — Z87442 Personal history of urinary calculi: Secondary | ICD-10-CM

## 2019-07-05 DIAGNOSIS — E162 Hypoglycemia, unspecified: Secondary | ICD-10-CM | POA: Diagnosis present

## 2019-07-05 DIAGNOSIS — E872 Acidosis: Secondary | ICD-10-CM | POA: Diagnosis present

## 2019-07-05 DIAGNOSIS — Z885 Allergy status to narcotic agent status: Secondary | ICD-10-CM | POA: Diagnosis not present

## 2019-07-05 DIAGNOSIS — E875 Hyperkalemia: Secondary | ICD-10-CM | POA: Diagnosis present

## 2019-07-05 DIAGNOSIS — E869 Volume depletion, unspecified: Secondary | ICD-10-CM | POA: Diagnosis present

## 2019-07-05 DIAGNOSIS — E860 Dehydration: Secondary | ICD-10-CM | POA: Diagnosis present

## 2019-07-05 DIAGNOSIS — Z8249 Family history of ischemic heart disease and other diseases of the circulatory system: Secondary | ICD-10-CM | POA: Diagnosis not present

## 2019-07-05 DIAGNOSIS — Z955 Presence of coronary angioplasty implant and graft: Secondary | ICD-10-CM

## 2019-07-05 DIAGNOSIS — I129 Hypertensive chronic kidney disease with stage 1 through stage 4 chronic kidney disease, or unspecified chronic kidney disease: Secondary | ICD-10-CM | POA: Diagnosis present

## 2019-07-05 DIAGNOSIS — U071 COVID-19: Secondary | ICD-10-CM | POA: Diagnosis present

## 2019-07-05 DIAGNOSIS — E876 Hypokalemia: Secondary | ICD-10-CM | POA: Diagnosis present

## 2019-07-05 DIAGNOSIS — Z7982 Long term (current) use of aspirin: Secondary | ICD-10-CM | POA: Diagnosis not present

## 2019-07-05 DIAGNOSIS — R42 Dizziness and giddiness: Secondary | ICD-10-CM | POA: Diagnosis not present

## 2019-07-05 DIAGNOSIS — Z9049 Acquired absence of other specified parts of digestive tract: Secondary | ICD-10-CM | POA: Diagnosis not present

## 2019-07-05 DIAGNOSIS — M1990 Unspecified osteoarthritis, unspecified site: Secondary | ICD-10-CM | POA: Diagnosis not present

## 2019-07-05 DIAGNOSIS — I2510 Atherosclerotic heart disease of native coronary artery without angina pectoris: Secondary | ICD-10-CM | POA: Diagnosis not present

## 2019-07-05 DIAGNOSIS — A0839 Other viral enteritis: Secondary | ICD-10-CM | POA: Diagnosis not present

## 2019-07-05 DIAGNOSIS — I10 Essential (primary) hypertension: Secondary | ICD-10-CM | POA: Diagnosis not present

## 2019-07-05 DIAGNOSIS — Z87891 Personal history of nicotine dependence: Secondary | ICD-10-CM | POA: Diagnosis not present

## 2019-07-05 DIAGNOSIS — R0902 Hypoxemia: Secondary | ICD-10-CM | POA: Diagnosis not present

## 2019-07-05 DIAGNOSIS — I959 Hypotension, unspecified: Secondary | ICD-10-CM | POA: Diagnosis not present

## 2019-07-05 DIAGNOSIS — Z79899 Other long term (current) drug therapy: Secondary | ICD-10-CM

## 2019-07-05 DIAGNOSIS — J069 Acute upper respiratory infection, unspecified: Secondary | ICD-10-CM | POA: Diagnosis present

## 2019-07-05 DIAGNOSIS — E161 Other hypoglycemia: Secondary | ICD-10-CM | POA: Diagnosis not present

## 2019-07-05 LAB — COMPREHENSIVE METABOLIC PANEL
ALT: 26 U/L (ref 0–44)
AST: 24 U/L (ref 15–41)
Albumin: 3.3 g/dL — ABNORMAL LOW (ref 3.5–5.0)
Alkaline Phosphatase: 43 U/L (ref 38–126)
Anion gap: 7 (ref 5–15)
BUN: 57 mg/dL — ABNORMAL HIGH (ref 8–23)
CO2: 18 mmol/L — ABNORMAL LOW (ref 22–32)
Calcium: 8.1 mg/dL — ABNORMAL LOW (ref 8.9–10.3)
Chloride: 112 mmol/L — ABNORMAL HIGH (ref 98–111)
Creatinine, Ser: 3.17 mg/dL — ABNORMAL HIGH (ref 0.61–1.24)
GFR calc Af Amer: 22 mL/min — ABNORMAL LOW (ref >60)
GFR calc non Af Amer: 19 mL/min — ABNORMAL LOW (ref >60)
Glucose, Bld: 126 mg/dL — ABNORMAL HIGH (ref 70–99)
Potassium: 5.7 mmol/L — ABNORMAL HIGH (ref 3.5–5.1)
Sodium: 137 mmol/L (ref 135–145)
Total Bilirubin: 0.5 mg/dL (ref 0.3–1.2)
Total Protein: 6.1 g/dL — ABNORMAL LOW (ref 6.5–8.1)

## 2019-07-05 LAB — CBC WITH DIFFERENTIAL/PLATELET
Abs Immature Granulocytes: 0.02 10*3/uL (ref 0.00–0.07)
Basophils Absolute: 0 10*3/uL (ref 0.0–0.1)
Basophils Relative: 0 %
Eosinophils Absolute: 0 10*3/uL (ref 0.0–0.5)
Eosinophils Relative: 0 %
HCT: 45.3 % (ref 39.0–52.0)
Hemoglobin: 14.6 g/dL (ref 13.0–17.0)
Immature Granulocytes: 0 %
Lymphocytes Relative: 17 %
Lymphs Abs: 1.2 10*3/uL (ref 0.7–4.0)
MCH: 31.6 pg (ref 26.0–34.0)
MCHC: 32.2 g/dL (ref 30.0–36.0)
MCV: 98.1 fL (ref 80.0–100.0)
Monocytes Absolute: 0.5 10*3/uL (ref 0.1–1.0)
Monocytes Relative: 7 %
Neutro Abs: 5.4 10*3/uL (ref 1.7–7.7)
Neutrophils Relative %: 76 %
Platelets: 161 10*3/uL (ref 150–400)
RBC: 4.62 MIL/uL (ref 4.22–5.81)
RDW: 13.1 % (ref 11.5–15.5)
WBC: 7.1 10*3/uL (ref 4.0–10.5)
nRBC: 0 % (ref 0.0–0.2)

## 2019-07-05 LAB — LACTIC ACID, PLASMA
Lactic Acid, Venous: 1.1 mmol/L (ref 0.5–1.9)
Lactic Acid, Venous: 1 mmol/L (ref 0.5–1.9)

## 2019-07-05 LAB — HIV ANTIBODY (ROUTINE TESTING W REFLEX): HIV Screen 4th Generation wRfx: NONREACTIVE

## 2019-07-05 LAB — POC SARS CORONAVIRUS 2 AG -  ED: SARS Coronavirus 2 Ag: POSITIVE — AB

## 2019-07-05 LAB — TROPONIN I (HIGH SENSITIVITY)
Troponin I (High Sensitivity): 11 ng/L (ref <18)
Troponin I (High Sensitivity): 11 ng/L (ref <18)

## 2019-07-05 LAB — LIPASE, BLOOD: Lipase: 39 U/L (ref 11–51)

## 2019-07-05 LAB — GLUCOSE, CAPILLARY: Glucose-Capillary: 108 mg/dL — ABNORMAL HIGH (ref 70–99)

## 2019-07-05 MED ORDER — ATORVASTATIN CALCIUM 40 MG PO TABS
40.0000 mg | ORAL_TABLET | Freq: Every day | ORAL | Status: DC
  Administered 2019-07-05 – 2019-07-07 (×3): 40 mg via ORAL
  Filled 2019-07-05 (×3): qty 1

## 2019-07-05 MED ORDER — SODIUM CHLORIDE 0.9 % IV BOLUS
1000.0000 mL | Freq: Once | INTRAVENOUS | Status: AC
  Administered 2019-07-05: 1000 mL via INTRAVENOUS

## 2019-07-05 MED ORDER — SODIUM ZIRCONIUM CYCLOSILICATE 5 G PO PACK
10.0000 g | PACK | Freq: Once | ORAL | Status: AC
  Administered 2019-07-05: 10 g via ORAL
  Filled 2019-07-05: qty 2

## 2019-07-05 MED ORDER — HEPARIN SODIUM (PORCINE) 5000 UNIT/ML IJ SOLN
5000.0000 [IU] | Freq: Three times a day (TID) | INTRAMUSCULAR | Status: DC
  Administered 2019-07-05 – 2019-07-07 (×5): 5000 [IU] via SUBCUTANEOUS
  Filled 2019-07-05 (×4): qty 1

## 2019-07-05 MED ORDER — ASPIRIN EC 81 MG PO TBEC
81.0000 mg | DELAYED_RELEASE_TABLET | Freq: Every day | ORAL | Status: DC
  Administered 2019-07-05 – 2019-07-07 (×3): 81 mg via ORAL
  Filled 2019-07-05 (×3): qty 1

## 2019-07-05 MED ORDER — ONDANSETRON HCL 4 MG PO TABS: 4.0000 mg | ORAL_TABLET | Freq: Four times a day (QID) | ORAL | Status: DC | PRN

## 2019-07-05 MED ORDER — ACETAMINOPHEN 325 MG PO TABS
650.0000 mg | ORAL_TABLET | Freq: Four times a day (QID) | ORAL | Status: DC | PRN
  Administered 2019-07-05: 650 mg via ORAL
  Filled 2019-07-05: qty 2

## 2019-07-05 MED ORDER — SODIUM CHLORIDE 0.9 % IV SOLN: INTRAVENOUS | Status: AC

## 2019-07-05 MED ORDER — SODIUM CHLORIDE 0.9 % IV SOLN: INTRAVENOUS | Status: DC

## 2019-07-05 MED ORDER — ONDANSETRON HCL 4 MG/2ML IJ SOLN: 4.0000 mg | Freq: Four times a day (QID) | INTRAMUSCULAR | Status: DC | PRN

## 2019-07-05 NOTE — ED Notes (Signed)
Updated patient's wife via telephone. States to notify her if needed. Her name is Vaughan Basta, number is [phone].

## 2019-07-05 NOTE — ED Provider Notes (Signed)
[facility] EMERGENCY DEPARTMENT Provider Note   CSN: [id number] Arrival date & time: 07/05/19  1056     History Chief Complaint  Patient presents with  . Dizziness    Charles Brooks is a 69 y.o. male.  Patient is a 69 year old male with past medical history of coronary artery disease with stent, chronic renal insufficiency, hypertension, hyperlipidemia. He presents today for evaluation of a 3-day history of nausea, vomiting, and diarrhea. Patient has had little appetite and reports becoming dizzy when he stands up. Patient became worse this morning, then EMS was called. He was found to have a blood sugar of 45, then was given oral glucose and sugar is now improved. He denies abdominal pain. He denies any black or bloody stool.  I have told that his wife has tested positive for COVID-19. Patient has been tested, however the results are outstanding.  The history is provided by the patient.  Dizziness Quality:  Lightheadedness Severity:  Moderate Onset quality:  Gradual Duration:  3 days Timing:  Constant Progression:  Worsening Chronicity:  New Context: standing up   Relieved by:  Nothing Worsened by:  Standing up Ineffective treatments:  None tried Associated symptoms: diarrhea, nausea and vomiting   Associated symptoms: no headaches        Past Medical History:  Diagnosis Date  . Arthritis   . CAD (coronary artery disease), native coronary artery    DES RCA and PTCA RVM 07/2014, LVEF 55%  . CKD (chronic kidney disease) stage 3, GFR 30-59 ml/min   . Hyperlipidemia   . Hypertension   . STEMI (ST elevation myocardial infarction) ([address])    07/2014    Patient Active Problem List   Diagnosis Date Noted  . Cellulitis and abscess 08/08/2015  . Arthritis 08/26/2014  . CKD (chronic kidney disease) stage 3, GFR 30-59 ml/min 08/22/2014  . Hypertriglyceridemia 08/16/2014  . Dyslipidemia - low HDL 08/16/2014  . CAD (coronary artery disease), native coronary artery  08/14/2014  . Former tobacco use 08/14/2014    Past Surgical History:  Procedure Laterality Date  . CHOLECYSTECTOMY    . COLONOSCOPY WITH PROPOFOL N/A 11/08/2013   Procedure: COLONOSCOPY WITH PROPOFOL;  Surgeon: [name], MD;  Location: [facility];  Service: General;  Laterality: N/A;  in cecum at 0741; cecal withdrawal time = 10 min  . EXTRACORPOREAL SHOCK WAVE LITHOTRIPSY    . INCISION AND DRAINAGE ABSCESS Right 08/12/2015   Procedure: INCISION AND DRAINAGE ABSCESS;  Surgeon: Aviva Signs, MD;  Location: [facility];  Service: General;  Laterality: Right;  . KNEE ARTHROSCOPY Right   . LEFT HEART CATHETERIZATION WITH CORONARY ANGIOGRAM N/A 08/14/2014   Procedure: LEFT HEART CATHETERIZATION WITH CORONARY ANGIOGRAM;  Surgeon: Troy Sine, MD;  LAD 50-60%, CFX OK, OM1 80%(small), RCA 100>>0% w/ 2.25x24 mm Synergy DES, EF 55%  . POLYPECTOMY N/A 11/08/2013   Procedure: POLYPECTOMY;  Surgeon: [name], MD;  Location: [facility];  Service: General;  Laterality: N/A;  cecal polyp  . Removal of kidney stones     Open  . WOUND DEBRIDEMENT Right 10/26/2015   Procedure: DEBRIDEMENT WOUND RIGHT FLANK;  Surgeon: Aviva Signs, MD;  Location: [facility];  Service: General;  Laterality: Right;       Family History  Problem Relation Age of Onset  . Heart attack Brother     Social History   Tobacco Use  . Smoking status: Former Smoker    Years: 20.00    Types: Cigars  Quit date: 11/05/2003    Years since quitting: 15.6  . Smokeless tobacco: Never Used  Substance Use Topics  . Alcohol use: No    Alcohol/week: 0.0 standard drinks  . Drug use: No    Home Medications Prior to Admission medications   Medication Sig Start Date End Date Taking? Authorizing Provider  aspirin EC 81 MG EC tablet Take 1 tablet (81 mg total) by mouth daily. 08/16/14   Barrett, Evelene Croon, PA-C  atorvastatin (LIPITOR) 40 MG tablet Take 1 tablet (40 mg total) by mouth daily. 07/29/16   Satira Sark, MD  metoprolol  tartrate (LOPRESSOR) 25 MG tablet Take 0.5 tablets (12.5 mg total) by mouth 2 (two) times daily. 08/16/14   Barrett, Evelene Croon, PA-C  nitroGLYCERIN (NITROSTAT) 0.4 MG SL tablet Place 1 tablet (0.4 mg total) under the tongue every 5 (five) minutes x 3 doses as needed for chest pain. 08/16/14   Barrett, Evelene Croon, PA-C    Allergies    Codeine  Review of Systems   Review of Systems  Gastrointestinal: Positive for diarrhea, nausea and vomiting.  Neurological: Positive for dizziness. Negative for headaches.  All other systems reviewed and are negative.   Physical Exam Updated Vital Signs Temp 98.1 F (36.7 C) (Oral)   Ht 6' (1.829 m)   Wt 81.2 kg   BMI 24.28 kg/m   Physical Exam Vitals and nursing note reviewed.  Constitutional:      General: He is not in acute distress.    Appearance: He is well-developed. He is not diaphoretic.  HENT:     Head: Normocephalic and atraumatic.  Cardiovascular:     Rate and Rhythm: Normal rate and regular rhythm.     Heart sounds: No murmur. No friction rub.  Pulmonary:     Effort: Pulmonary effort is normal. No respiratory distress.     Breath sounds: Normal breath sounds. No wheezing or rales.  Abdominal:     General: Bowel sounds are normal. There is no distension.     Palpations: Abdomen is soft.     Tenderness: There is no abdominal tenderness.  Musculoskeletal:        General: Normal range of motion.     Cervical back: Normal range of motion and neck supple.  Skin:    General: Skin is warm and dry.  Neurological:     Mental Status: He is alert and oriented to person, place, and time.     Coordination: Coordination normal.     ED Results / Procedures / Treatments   Labs (all labs ordered are listed, but only abnormal results are displayed) Labs Reviewed  COMPREHENSIVE METABOLIC PANEL  CBC WITH DIFFERENTIAL/PLATELET  LIPASE, BLOOD  LACTIC ACID, PLASMA  LACTIC ACID, PLASMA  POC SARS CORONAVIRUS 2 AG -  ED  TROPONIN I (HIGH  SENSITIVITY)    EKG EKG Interpretation  Date/Time:  Friday July 05 2019 11:28:15 EST Ventricular Rate:  71 PR Interval:    QRS Duration: 83 QT Interval:  378 QTC Calculation: 411 R Axis:   34 Text Interpretation: Sinus rhythm Low voltage, precordial leads Abnormal R-wave progression, early transition Borderline repolarization abnormality Confirmed by Veryl Speak [phone]) on 07/05/2019 11:41:16 AM   Radiology No results found.  Procedures Procedures (including critical care time)  Medications Ordered in ED Medications  sodium chloride 0.9 % bolus 1,000 mL (has no administration in time range)    ED Course  I have reviewed the triage vital signs and the nursing notes.  Pertinent labs & imaging results that were available during my care of the patient were reviewed by me and considered in my medical decision making (see chart for details).    MDM Rules/Calculators/A&P  Patient presents with a 3-day history of nausea, vomiting, and diarrhea.  He is now having dizziness with standing.  He arrives here hypotensive with blood pressure of 72/49.  Laboratory studies reveal acute renal failure/dehydration.  Patient given IV fluids and blood pressures have improved somewhat.  COVID-19 test has returned as positive, but has normal oxygen saturations and no respiratory symptoms..  I am told the patient's wife also has Covid at home.  Patient will be admitted for hydration and further care.  CRITICAL CARE Performed by: Veryl Speak Total critical care time: 35 minutes Critical care time was exclusive of separately billable procedures and treating other patients. Critical care was necessary to treat or prevent imminent or life-threatening deterioration. Critical care was time spent personally by me on the following activities: development of treatment plan with patient and/or surrogate as well as nursing, discussions with consultants, evaluation of patient's response to treatment,  examination of patient, obtaining history from patient or surrogate, ordering and performing treatments and interventions, ordering and review of laboratory studies, ordering and review of radiographic studies, pulse oximetry and re-evaluation of patient's condition.   Final Clinical Impression(s) / ED Diagnoses Final diagnoses:  None    Rx / DC Orders ED Discharge Orders    None       Veryl Speak, MD 07/06/19 [id number]

## 2019-07-05 NOTE — ED Triage Notes (Addendum)
Patient brought in by EMS for complaint of dizziness, hypotension, and hypoglycemia. States his wife is covid positive and he is waiting for results from his test. States he has had n/v/d x 3 days but has resolved. States he got up this morning with dizziness and CBG was 45. Per EMS, he was given soda with sugar and 3 peanut butter crackers en route to ER. Last CBG 134.

## 2019-07-05 NOTE — ED Notes (Signed)
Pt ate 100% of meal tray Pt given water per request.

## 2019-07-05 NOTE — H&P (Signed)
History and Physical  Charles Brooks [id number] DOB: 1950/10/02 DOA: 07/05/2019   PCP: [name], MD   Patient coming from: Home  Chief Complaint: dizziness, n/v/d  HPI:  Charles Brooks is a 69 y.o. male with medical history of coronary artery disease with stent, CKD stage III, hyperlipidemia, hypertension presenting with 3 to 4-day history of nausea, vomiting, and diarrhea.  The patient states that his wife was tested Covid positive on 06/24/2019.  He did not began feeling bad until 07/02/2019 when he had development of the above symptoms.  Approximately 1 to 2 days later he began having some dizziness, but he denied any headaches, visual disturbance, dysarthria, or focal extremity weakness.  He denied any hematemesis, hematochezia, melena.  He was having 3-4 loose bowel movements on a daily basis.  He denies any dysuria, hematuria, abdominal pain.  He did not have any chest pain, shortness of breath, but did have a nonproductive cough.  His dizziness progressed to the point where every time he was standing he would have near syncopal episodes.  As result he presented for further evaluation.  When EMS arrived, the patient was noted to be hypotensive and hypoglycemic. Per EMS, he was given soda with sugar and 3 peanut butter crackers en route to ER. CBG 45>>> 134.  In the emergency department, the patient was afebrile and hemodynamically stable with oxygen saturation 100% on room air.  BMP showed a potassium of 5.7 with serum creatinine 3.17.  WBC 7.1, hemoglobin 14.6 platelet, platelets 161,000.  Troponin was 11 with lactic acid 1.1.  LFTs were unremarkable.  Chest x-ray was negative for any acute findings.  Point-of-care Covid test was positive.    Assessment/Plan: Acute Covid gastroenteritis -Secondary to SARS-CoV2 -start IVF -Start antiemetic -Patient states that his loose stools have slowed down -no indication for remdesivir or steroids presently  Acute on chronic renal  failure--CKD stage III -Baseline creatinine 1.2-1.3 -Presented with serum creatinine 3.17 -Secondary to volume depletion -Start IV fluids  Essential hypertension -Holding metoprolol secondary to hypotension  Hyperlipidemia -Resume statin  Hypokalemia -Lokelma x1 -continue IVF  Coronary artery disease -Continue aspirin -No chest pain presently         Past Medical History:  Diagnosis Date  . Arthritis   . CAD (coronary artery disease), native coronary artery    DES RCA and PTCA RVM 07/2014, LVEF 55%  . CKD (chronic kidney disease) stage 3, GFR 30-59 ml/min   . Hyperlipidemia   . Hypertension   . STEMI (ST elevation myocardial infarction) ([place])    07/2014   Past Surgical History:  Procedure Laterality Date  . CHOLECYSTECTOMY    . COLONOSCOPY WITH PROPOFOL N/A 11/08/2013   Procedure: COLONOSCOPY WITH PROPOFOL;  Surgeon: [name], MD;  Location: [facility];  Service: General;  Laterality: N/A;  in cecum at 0741; cecal withdrawal time = 10 min  . EXTRACORPOREAL SHOCK WAVE LITHOTRIPSY    . INCISION AND DRAINAGE ABSCESS Right 08/12/2015   Procedure: INCISION AND DRAINAGE ABSCESS;  Surgeon: Aviva Signs, MD;  Location: [facility];  Service: General;  Laterality: Right;  . KNEE ARTHROSCOPY Right   . LEFT HEART CATHETERIZATION WITH CORONARY ANGIOGRAM N/A 08/14/2014   Procedure: LEFT HEART CATHETERIZATION WITH CORONARY ANGIOGRAM;  Surgeon: Troy Sine, MD;  LAD 50-60%, CFX OK, OM1 80%(small), RCA 100>>0% w/ 2.25x24 mm Synergy DES, EF 55%  . POLYPECTOMY N/A 11/08/2013   Procedure: POLYPECTOMY;  Surgeon: [name], MD;  Location: [facility];  Service: General;  Laterality: N/A;  cecal polyp  . Removal of kidney stones     Open  . WOUND DEBRIDEMENT Right 10/26/2015   Procedure: DEBRIDEMENT WOUND RIGHT FLANK;  Surgeon: Aviva Signs, MD;  Location: [facility];  Service: General;  Laterality: Right;   Social History:  reports that he quit smoking about 15 years ago. His smoking use  included cigars. He quit after 20.00 years of use. He has never used smokeless tobacco. He reports that he does not drink alcohol or use drugs.   Family History  Problem Relation Age of Onset  . Heart attack Brother      Allergies  Allergen Reactions  . Codeine Hives     Prior to Admission medications   Medication Sig Start Date End Date Taking? Authorizing Provider  aspirin EC 81 MG EC tablet Take 1 tablet (81 mg total) by mouth daily. 08/16/14   Barrett, Evelene Croon, PA-C  atorvastatin (LIPITOR) 40 MG tablet Take 1 tablet (40 mg total) by mouth daily. 07/29/16   Satira Sark, MD  metoprolol tartrate (LOPRESSOR) 25 MG tablet Take 0.5 tablets (12.5 mg total) by mouth 2 (two) times daily. 08/16/14   Barrett, Evelene Croon, PA-C  nitroGLYCERIN (NITROSTAT) 0.4 MG SL tablet Place 1 tablet (0.4 mg total) under the tongue every 5 (five) minutes x 3 doses as needed for chest pain. 08/16/14   Barrett, Evelene Croon, PA-C    Review of Systems:  Constitutional:  No weight loss, night sweats, Fevers, chills, fatigue.  Head&Eyes: No headache.  No vision loss.  No eye pain or scotoma ENT:  No Difficulty swallowing,Tooth/dental problems,Sore throat,  No ear ache, post nasal drip,  Cardio-vascular:  No chest pain, Orthopnea, PND, swelling in lower extremities,  dizziness, palpitations  GI:  No  abdominal pain, n hematochezia, melena, heartburn, indigestion, Resp:  No shortness of breath with exertion or at rest. No cough. No coughing up of blood .No wheezing.No chest wall deformity  Skin:  no rash or lesions.  GU:  no dysuria, change in color of urine, no urgency or frequency. No flank pain.  Musculoskeletal:  No joint pain or swelling. No decreased range of motion. No back pain.  Psych:  No change in mood or affect. No depression or anxiety. Neurologic: No headache, no dysesthesia, no focal weakness, no vision loss. No syncope  Physical Exam: Vitals:   07/05/19 1114 07/05/19 1115 07/05/19  1130 07/05/19 1305  BP:  (!) 72/49 (!) 84/53 97/63  Pulse:  86 65 68  Resp:   15   Temp:      TempSrc:      SpO2:  98% 97% 100%  Weight: 81.2 kg     Height: 6' (1.829 m)      General:  A&O x 3, NAD, nontoxic, pleasant/cooperative Head/Eye: No conjunctival hemorrhage, no icterus, [place]/AT, No nystagmus ENT:  No icterus,  No thrush, good dentition, no pharyngeal exudate Neck:  No masses, no lymphadenpathy, no bruits CV:  RRR, no rub, no gallop, no S3 Lung:  CTAB, good air movement, no wheeze, no rhonchi Abdomen: soft/NT, +BS, nondistended, no peritoneal signs Ext: No cyanosis, No rashes, No petechiae, No lymphangitis, No edema Neuro: CNII-XII intact, strength 4/5 in bilateral upper and lower extremities, no dysmetria  Labs on Admission:  Basic Metabolic Panel: Recent Labs  Lab 07/05/19 1210  NA 137  K 5.7*  CL 112*  CO2 18*  GLUCOSE 126*  BUN 57*  CREATININE  3.17*  CALCIUM 8.1*   Liver Function Tests: Recent Labs  Lab 07/05/19 1210  AST 24  ALT 26  ALKPHOS 43  BILITOT 0.5  PROT 6.1*  ALBUMIN 3.3*   Recent Labs  Lab 07/05/19 1210  LIPASE 39   No results for input(s): AMMONIA in the last 168 hours. CBC: Recent Labs  Lab 07/05/19 1210  WBC 7.1  NEUTROABS 5.4  HGB 14.6  HCT 45.3  MCV 98.1  PLT 161   Coagulation Profile: No results for input(s): INR, PROTIME in the last 168 hours. Cardiac Enzymes: No results for input(s): CKTOTAL, CKMB, CKMBINDEX, TROPONINI in the last 168 hours. BNP: Invalid input(s): POCBNP CBG: No results for input(s): GLUCAP in the last 168 hours. Urine analysis: No results found for: COLORURINE, APPEARANCEUR, LABSPEC, PHURINE, GLUCOSEU, HGBUR, BILIRUBINUR, KETONESUR, PROTEINUR, UROBILINOGEN, NITRITE, LEUKOCYTESUR Sepsis Labs: @LABRCNTIP (procalcitonin:4,lacticidven:4) )No results found for this or any previous visit (from the past 240 hour(s)).   Radiological Exams on Admission: DG Chest Port 1 View  Result Date:  07/05/2019 CLINICAL DATA:  Dizziness, hypertension EXAM: PORTABLE CHEST 1 VIEW COMPARISON:  None. FINDINGS: The heart size and mediastinal contours are within normal limits. Both lungs are clear. The visualized skeletal structures are unremarkable. IMPRESSION: No active disease. Electronically Signed   By: Kathreen Devoid   On: 07/05/2019 12:22        Time spent:60 minutes Code Status:   FULL Family Communication:  No Family at bedside Disposition Plan: expect 1-2 day hospitalization Consults called: none DVT Prophylaxis: [facility] Heparin     Orson Eva, DO  [facility] Pager [phone]  If 7PM-7AM, please contact night-coverage [email or web address] Password [facility] 07/05/2019, 2:16 PM

## 2019-07-05 NOTE — ED Notes (Signed)
Pt given dinner tray,  

## 2019-07-06 DIAGNOSIS — Z8249 Family history of ischemic heart disease and other diseases of the circulatory system: Secondary | ICD-10-CM | POA: Diagnosis not present

## 2019-07-06 DIAGNOSIS — Z87891 Personal history of nicotine dependence: Secondary | ICD-10-CM | POA: Diagnosis not present

## 2019-07-06 DIAGNOSIS — E869 Volume depletion, unspecified: Secondary | ICD-10-CM | POA: Diagnosis present

## 2019-07-06 DIAGNOSIS — E785 Hyperlipidemia, unspecified: Secondary | ICD-10-CM | POA: Diagnosis present

## 2019-07-06 DIAGNOSIS — E872 Acidosis: Secondary | ICD-10-CM | POA: Diagnosis present

## 2019-07-06 DIAGNOSIS — U071 COVID-19: Secondary | ICD-10-CM | POA: Diagnosis present

## 2019-07-06 DIAGNOSIS — E860 Dehydration: Secondary | ICD-10-CM | POA: Diagnosis present

## 2019-07-06 DIAGNOSIS — I252 Old myocardial infarction: Secondary | ICD-10-CM | POA: Diagnosis not present

## 2019-07-06 DIAGNOSIS — Z79899 Other long term (current) drug therapy: Secondary | ICD-10-CM | POA: Diagnosis not present

## 2019-07-06 DIAGNOSIS — N179 Acute kidney failure, unspecified: Secondary | ICD-10-CM | POA: Diagnosis present

## 2019-07-06 DIAGNOSIS — E875 Hyperkalemia: Secondary | ICD-10-CM | POA: Diagnosis present

## 2019-07-06 DIAGNOSIS — N183 Chronic kidney disease, stage 3 unspecified: Secondary | ICD-10-CM | POA: Diagnosis present

## 2019-07-06 DIAGNOSIS — Z955 Presence of coronary angioplasty implant and graft: Secondary | ICD-10-CM | POA: Diagnosis not present

## 2019-07-06 DIAGNOSIS — I959 Hypotension, unspecified: Secondary | ICD-10-CM | POA: Diagnosis present

## 2019-07-06 DIAGNOSIS — E162 Hypoglycemia, unspecified: Secondary | ICD-10-CM | POA: Diagnosis present

## 2019-07-06 DIAGNOSIS — M1990 Unspecified osteoarthritis, unspecified site: Secondary | ICD-10-CM | POA: Diagnosis present

## 2019-07-06 DIAGNOSIS — I129 Hypertensive chronic kidney disease with stage 1 through stage 4 chronic kidney disease, or unspecified chronic kidney disease: Secondary | ICD-10-CM | POA: Diagnosis present

## 2019-07-06 DIAGNOSIS — Z885 Allergy status to narcotic agent status: Secondary | ICD-10-CM | POA: Diagnosis not present

## 2019-07-06 DIAGNOSIS — A0839 Other viral enteritis: Secondary | ICD-10-CM | POA: Diagnosis present

## 2019-07-06 DIAGNOSIS — Z87442 Personal history of urinary calculi: Secondary | ICD-10-CM | POA: Diagnosis not present

## 2019-07-06 DIAGNOSIS — E876 Hypokalemia: Secondary | ICD-10-CM | POA: Diagnosis present

## 2019-07-06 DIAGNOSIS — Z9049 Acquired absence of other specified parts of digestive tract: Secondary | ICD-10-CM | POA: Diagnosis not present

## 2019-07-06 DIAGNOSIS — I2510 Atherosclerotic heart disease of native coronary artery without angina pectoris: Secondary | ICD-10-CM | POA: Diagnosis present

## 2019-07-06 DIAGNOSIS — Z7982 Long term (current) use of aspirin: Secondary | ICD-10-CM | POA: Diagnosis not present

## 2019-07-06 DIAGNOSIS — J069 Acute upper respiratory infection, unspecified: Secondary | ICD-10-CM | POA: Diagnosis present

## 2019-07-06 HISTORY — DX: Hyperkalemia: E87.5

## 2019-07-06 LAB — COMPREHENSIVE METABOLIC PANEL
ALT: 21 U/L (ref 0–44)
AST: 25 U/L (ref 15–41)
Albumin: 2.9 g/dL — ABNORMAL LOW (ref 3.5–5.0)
Alkaline Phosphatase: 39 U/L (ref 38–126)
Anion gap: 7 (ref 5–15)
BUN: 48 mg/dL — ABNORMAL HIGH (ref 8–23)
CO2: 17 mmol/L — ABNORMAL LOW (ref 22–32)
Calcium: 8 mg/dL — ABNORMAL LOW (ref 8.9–10.3)
Chloride: 116 mmol/L — ABNORMAL HIGH (ref 98–111)
Creatinine, Ser: 2.19 mg/dL — ABNORMAL HIGH (ref 0.61–1.24)
GFR calc Af Amer: 34 mL/min — ABNORMAL LOW (ref >60)
GFR calc non Af Amer: 30 mL/min — ABNORMAL LOW (ref >60)
Glucose, Bld: 97 mg/dL (ref 70–99)
Potassium: 5 mmol/L (ref 3.5–5.1)
Sodium: 140 mmol/L (ref 135–145)
Total Bilirubin: 0.6 mg/dL (ref 0.3–1.2)
Total Protein: 5.3 g/dL — ABNORMAL LOW (ref 6.5–8.1)

## 2019-07-06 LAB — C-REACTIVE PROTEIN: CRP: 0.8 mg/dL (ref <1.0)

## 2019-07-06 LAB — CBC WITH DIFFERENTIAL/PLATELET
Abs Immature Granulocytes: 0.02 10*3/uL (ref 0.00–0.07)
Basophils Absolute: 0 10*3/uL (ref 0.0–0.1)
Basophils Relative: 0 %
Eosinophils Absolute: 0 10*3/uL (ref 0.0–0.5)
Eosinophils Relative: 0 %
HCT: 39.2 % (ref 39.0–52.0)
Hemoglobin: 12.6 g/dL — ABNORMAL LOW (ref 13.0–17.0)
Immature Granulocytes: 0 %
Lymphocytes Relative: 31 %
Lymphs Abs: 2 10*3/uL (ref 0.7–4.0)
MCH: 31.3 pg (ref 26.0–34.0)
MCHC: 32.1 g/dL (ref 30.0–36.0)
MCV: 97.3 fL (ref 80.0–100.0)
Monocytes Absolute: 0.3 10*3/uL (ref 0.1–1.0)
Monocytes Relative: 5 %
Neutro Abs: 4.1 10*3/uL (ref 1.7–7.7)
Neutrophils Relative %: 64 %
Platelets: 134 10*3/uL — ABNORMAL LOW (ref 150–400)
RBC: 4.03 MIL/uL — ABNORMAL LOW (ref 4.22–5.81)
RDW: 13 % (ref 11.5–15.5)
WBC: 6.4 10*3/uL (ref 4.0–10.5)
nRBC: 0 % (ref 0.0–0.2)

## 2019-07-06 LAB — FERRITIN: Ferritin: 720 ng/mL — ABNORMAL HIGH (ref 24–336)

## 2019-07-06 LAB — ABO/RH: ABO/RH(D): A POS

## 2019-07-06 LAB — D-DIMER, QUANTITATIVE: D-Dimer, Quant: 4.28 ug/mL-FEU — ABNORMAL HIGH (ref 0.00–0.50)

## 2019-07-06 MED ORDER — SODIUM CHLORIDE 0.9 % IV SOLN: INTRAVENOUS | Status: DC

## 2019-07-06 NOTE — Progress Notes (Signed)
Patient Demographics:    Charles Brooks, is a 69 y.o. male, DOB - 07/11/1950, [id number]  Admit date - 07/05/2019   Admitting Physician Toia Micale Denton Brick, MD  Outpatient Primary MD for the patient is [name], MD  LOS - 0   Chief Complaint  Patient presents with  . Dizziness        Subjective:    Charles Brooks today has no fevers,   No chest pain,   \-Voiding well, No flank pain Diarrhea and emesis resolved  Assessment  & Plan :    Principal Problem:   AKI (acute kidney injury) ([place]) Active Problems:   Gastroenteritis due to COVID-19 virus   Acute respiratory disease due to COVID-19 virus   Hyperkalemia   CAD (coronary artery disease), native coronary artery   Dyslipidemia - low HDL  Brief Summary:- 69 y.o. male with medical history of coronary artery disease with stent, CKD stage III, hyperlipidemia, hypertension presenting with 3 to 4-day history of nausea, vomiting, and diarrhea admitted on 07/04/2018 with acute kidney injury in the setting of GI losses secondary to Covid infection   A/P 1)AKI----acute kidney injury on CKD stage - III   creatinine on admission= 3.17 ,   baseline creatinine = No recent baseline    , creatinine is now= 2.19      , renally adjust medications, avoid nephrotoxic agents/dehydration/hypotension  -Urine output improving -Non-anion gap metabolic acidosis persists  2)Hyperkalemia--in the setting of acute on chronic kidney failure potassium down to 5.0 from 5.7 after Lokelma  3)Recent COVID-19 infection--- diagnosed 05/23/2020--- does not meet criteria for remdesivir or steroids at this time, no hypoxia and no significant chest x-ray findings  4)H/o CAD--- asymptomatic, continue aspirin and statin, metoprolol hold due to soft BP  5) nausea vomiting diarrhea----resolving, continue as needed antiemetics  Disposition/Need for in-Hospital Stay- patient unable  to be discharged at this time due to --- acute kidney injury requiring IV fluids  Code Status : full  Family Communication:     (patient is alert, awake and coherent)   Disposition Plan  : TBD--  Consults  :  na  DVT Prophylaxis  :   - Heparin - SCD  Lab Results  Component Value Date   PLT 134 (L) 07/06/2019    Inpatient Medications  Scheduled Meds: . aspirin EC  81 mg Oral Daily  . atorvastatin  40 mg Oral Daily  . heparin  5,000 Units Subcutaneous Q8H   Continuous Infusions: PRN Meds:.acetaminophen, ondansetron **OR** ondansetron (ZOFRAN) IV    Anti-infectives (From admission, onward)   None        Objective:   Vitals:   07/05/19 2200 07/05/19 2243 07/06/19 0500 07/06/19 1535  BP: 93/64 (!) 85/57 112/61 104/65  Pulse: 71 89 82 67  Resp: 18 16 16 20   Temp:  98.3 F (36.8 C) 98.3 F (36.8 C) 98.1 F (36.7 C)  TempSrc:  Oral Oral Oral  SpO2: 99% 99% 100% 100%  Weight:  78.7 kg    Height:  6' (1.829 m)      Wt Readings from Last 3 Encounters:  07/05/19 78.7 kg  06/29/18 84.4 kg  02/06/17 85.3 kg     Intake/Output Summary (Last 24 hours) at 07/06/2019 1916  Last data filed at 07/06/2019 1557 Gross per 24 hour  Intake 2268.65 ml  Output 1300 ml  Net 968.65 ml     Physical Exam  Gen:- Awake Alert,  In no apparent distress  HEENT:- [place].AT, No sclera icterus Neck-Supple Neck,No JVD,.  Lungs-  CTAB , fair symmetrical air movement CV- S1, S2 normal, regular  Abd-  +ve B.Sounds, Abd Soft, No tenderness,    Extremity/Skin:- No  edema, pedal pulses present  Psych-affect is appropriate, oriented x3 Neuro-no new focal deficits, no tremors   Data Review:   Micro Results No results found for this or any previous visit (from the past 240 hour(s)).  Radiology Reports DG Chest Port 1 View  Result Date: 07/05/2019 CLINICAL DATA:  Dizziness, hypertension EXAM: PORTABLE CHEST 1 VIEW COMPARISON:  None. FINDINGS: The heart size and mediastinal contours are  within normal limits. Both lungs are clear. The visualized skeletal structures are unremarkable. IMPRESSION: No active disease. Electronically Signed   By: Kathreen Devoid   On: 07/05/2019 12:22     CBC Recent Labs  Lab 07/05/19 1210 07/06/19 0800  WBC 7.1 6.4  HGB 14.6 12.6*  HCT 45.3 39.2  PLT 161 134*  MCV 98.1 97.3  MCH 31.6 31.3  MCHC 32.2 32.1  RDW 13.1 13.0  LYMPHSABS 1.2 2.0  MONOABS 0.5 0.3  EOSABS 0.0 0.0  BASOSABS 0.0 0.0    Chemistries  Recent Labs  Lab 07/05/19 1210 07/06/19 0800  NA 137 140  K 5.7* 5.0  CL 112* 116*  CO2 18* 17*  GLUCOSE 126* 97  BUN 57* 48*  CREATININE 3.17* 2.19*  CALCIUM 8.1* 8.0*  AST 24 25  ALT 26 21  ALKPHOS 43 39  BILITOT 0.5 0.6   ------------------------------------------------------------------------------------------------------------------ No results for input(s): CHOL, HDL, LDLCALC, TRIG, CHOLHDL, LDLDIRECT in the last 72 hours.  Lab Results  Component Value Date   HGBA1C 6.1 (H) 08/14/2014   ------------------------------------------------------------------------------------------------------------------ No results for input(s): TSH, T4TOTAL, T3FREE, THYROIDAB in the last 72 hours.  Invalid input(s): FREET3 ------------------------------------------------------------------------------------------------------------------ Recent Labs    07/06/19 0801  FERRITIN 720*    Coagulation profile No results for input(s): INR, PROTIME in the last 168 hours.  Recent Labs    07/06/19 0800  DDIMER 4.28*    Cardiac Enzymes No results for input(s): CKMB, TROPONINI, MYOGLOBIN in the last 168 hours.  Invalid input(s): CK ------------------------------------------------------------------------------------------------------------------    Component Value Date/Time   BNP 30.2 08/14/2014 0730     Brayant Dorr M.D on 07/06/2019 at 7:16 PM  Go to [email or web address] - for contact info  [facility] - Office   [phone]

## 2019-07-07 LAB — COMPREHENSIVE METABOLIC PANEL
ALT: 19 U/L (ref 0–44)
AST: 24 U/L (ref 15–41)
Albumin: 2.8 g/dL — ABNORMAL LOW (ref 3.5–5.0)
Alkaline Phosphatase: 37 U/L — ABNORMAL LOW (ref 38–126)
Anion gap: 4 — ABNORMAL LOW (ref 5–15)
BUN: 31 mg/dL — ABNORMAL HIGH (ref 8–23)
CO2: 19 mmol/L — ABNORMAL LOW (ref 22–32)
Calcium: 8 mg/dL — ABNORMAL LOW (ref 8.9–10.3)
Chloride: 116 mmol/L — ABNORMAL HIGH (ref 98–111)
Creatinine, Ser: 1.57 mg/dL — ABNORMAL HIGH (ref 0.61–1.24)
GFR calc Af Amer: 51 mL/min — ABNORMAL LOW (ref >60)
GFR calc non Af Amer: 44 mL/min — ABNORMAL LOW (ref >60)
Glucose, Bld: 96 mg/dL (ref 70–99)
Potassium: 5 mmol/L (ref 3.5–5.1)
Sodium: 139 mmol/L (ref 135–145)
Total Bilirubin: 0.6 mg/dL (ref 0.3–1.2)
Total Protein: 5.1 g/dL — ABNORMAL LOW (ref 6.5–8.1)

## 2019-07-07 LAB — CBC WITH DIFFERENTIAL/PLATELET
Abs Immature Granulocytes: 0.02 10*3/uL (ref 0.00–0.07)
Basophils Absolute: 0 10*3/uL (ref 0.0–0.1)
Basophils Relative: 0 %
Eosinophils Absolute: 0 10*3/uL (ref 0.0–0.5)
Eosinophils Relative: 1 %
HCT: 37.2 % — ABNORMAL LOW (ref 39.0–52.0)
Hemoglobin: 11.9 g/dL — ABNORMAL LOW (ref 13.0–17.0)
Immature Granulocytes: 0 %
Lymphocytes Relative: 30 %
Lymphs Abs: 1.4 10*3/uL (ref 0.7–4.0)
MCH: 31.5 pg (ref 26.0–34.0)
MCHC: 32 g/dL (ref 30.0–36.0)
MCV: 98.4 fL (ref 80.0–100.0)
Monocytes Absolute: 0.3 10*3/uL (ref 0.1–1.0)
Monocytes Relative: 6 %
Neutro Abs: 2.9 10*3/uL (ref 1.7–7.7)
Neutrophils Relative %: 63 %
Platelets: 128 10*3/uL — ABNORMAL LOW (ref 150–400)
RBC: 3.78 MIL/uL — ABNORMAL LOW (ref 4.22–5.81)
RDW: 12.9 % (ref 11.5–15.5)
WBC: 4.7 10*3/uL (ref 4.0–10.5)
nRBC: 0 % (ref 0.0–0.2)

## 2019-07-07 MED ORDER — ASPIRIN 81 MG PO TBEC: 81.0000 mg | DELAYED_RELEASE_TABLET | Freq: Every day | ORAL | 1 refills | Status: AC

## 2019-07-07 MED ORDER — ALPRAZOLAM 0.25 MG PO TABS
0.2500 mg | ORAL_TABLET | Freq: Every evening | ORAL | Status: DC | PRN
  Administered 2019-07-07: 0.25 mg via ORAL
  Filled 2019-07-07: qty 1

## 2019-07-07 NOTE — Progress Notes (Signed)
SATURATION QUALIFICATIONS: (This note is used to comply with regulatory documentation for home oxygen)  Patient Saturations on Room Air at Rest = 100%  Patient Saturations on Room Air while Ambulating = 100%  

## 2019-07-07 NOTE — Progress Notes (Signed)
Nsg Discharge Note  Admit Date:  07/05/2019 Discharge date: 07/07/2019   [name] to be D/C'd Home  per MD order.  AVS completed.  Patient able to verbalize understanding.  Discharge Medication: Allergies as of 07/07/2019      Reactions   Codeine Hives      Medication List    STOP taking these medications   lisinopril 20 MG tablet Commonly known as: ZESTRIL     TAKE these medications   aspirin 81 MG EC tablet Take 1 tablet (81 mg total) by mouth daily with breakfast. What changed: when to take this   atorvastatin 40 MG tablet Commonly known as: LIPITOR Take 1 tablet (40 mg total) by mouth daily.   metoprolol tartrate 25 MG tablet Commonly known as: LOPRESSOR Take 0.5 tablets (12.5 mg total) by mouth 2 (two) times daily.   nitroGLYCERIN 0.4 MG SL tablet Commonly known as: NITROSTAT Place 1 tablet (0.4 mg total) under the tongue every 5 (five) minutes x 3 doses as needed for chest pain.       Discharge Assessment: Vitals:   07/06/19 2127 07/07/19 0532  BP: 110/72 112/72  Pulse: 76 85  Resp: 18 18  Temp: 98.5 F (36.9 C) 98.9 F (37.2 C)  SpO2: 97% 98%   Skin clean, dry and intact without evidence of skin break down, no evidence of skin tears noted. IV catheter discontinued intact. Site without signs and symptoms of complications - no redness or edema noted at insertion site, patient denies c/o pain - only slight tenderness at site.  Dressing with slight pressure applied.  D/c Instructions-Education: Discharge instructions given to patient with verbalized understanding. D/c education completed with patient including follow up instructions, medication list, d/c activities limitations if indicated, with other d/c instructions as indicated by MD - patient able to verbalize understanding, all questions fully answered. Patient instructed to return to ED, call 911, or call MD for any changes in condition.  Patient escorted via [place], and D/C home via private  auto.  Berton Bon, RN 07/07/2019 12:41 PM

## 2019-07-07 NOTE — Discharge Summary (Signed)
Charles Brooks, is a 69 y.o. male  DOB 1950/11/21  MRN [id number].  Admission date:  07/05/2019  Admitting Physician  Jamiah Recore Denton Brick, MD  Discharge Date:  07/07/2019   Primary MD  [name], MD  Recommendations for primary care physician for things to follow:   1) stop lisinopril due to concerns about worsening kidney function and high potassium levels- 2)Avoid ibuprofen/Advil/Aleve/Motrin/Goody Powders/Naproxen/BC powders/Meloxicam/Diclofenac/Indomethacin and other Nonsteroidal anti-inflammatory medications as these will make you more likely to bleed and can cause stomach ulcers, can also cause Kidney problems.  3) recheck BMP blood test with the primary care physician in about a week from now 4) the next time you see your cardiologist discussed possible need for echocardiogram test 5) You are strongly advised to  to isolate for at least 21 days from the date of your diagnoses with COVID-19 infection--please always wear a mask if you have to go outside the house  Admission Diagnosis  AKI (acute kidney injury) ([place]) [N17.9] Acute respiratory disease due to COVID-19 virus [U07.1, J06.9]   Discharge Diagnosis  AKI (acute kidney injury) ([place]) [N17.9] Acute respiratory disease due to COVID-19 virus [U07.1, J06.9]   Principal Problem:   AKI (acute kidney injury) ([place]) Active Problems:   Gastroenteritis due to COVID-19 virus   Acute respiratory disease due to COVID-19 virus   Hyperkalemia   CAD (coronary artery disease), native coronary artery   Dyslipidemia - low HDL      Past Medical History:  Diagnosis Date  . Arthritis   . CAD (coronary artery disease), native coronary artery    DES RCA and PTCA RVM 07/2014, LVEF 55%  . CKD (chronic kidney disease) stage 3, GFR 30-59 ml/min   . Hyperlipidemia   . Hypertension   . STEMI (ST elevation myocardial infarction) ([place])    07/2014    Past Surgical  History:  Procedure Laterality Date  . CHOLECYSTECTOMY    . COLONOSCOPY WITH PROPOFOL N/A 11/08/2013   Procedure: COLONOSCOPY WITH PROPOFOL;  Surgeon: [name], MD;  Location: [facility];  Service: General;  Laterality: N/A;  in cecum at 0741; cecal withdrawal time = 10 min  . EXTRACORPOREAL SHOCK WAVE LITHOTRIPSY    . INCISION AND DRAINAGE ABSCESS Right 08/12/2015   Procedure: INCISION AND DRAINAGE ABSCESS;  Surgeon: Aviva Signs, MD;  Location: [facility];  Service: General;  Laterality: Right;  . KNEE ARTHROSCOPY Right   . LEFT HEART CATHETERIZATION WITH CORONARY ANGIOGRAM N/A 08/14/2014   Procedure: LEFT HEART CATHETERIZATION WITH CORONARY ANGIOGRAM;  Surgeon: Troy Sine, MD;  LAD 50-60%, CFX OK, OM1 80%(small), RCA 100>>0% w/ 2.25x24 mm Synergy DES, EF 55%  . POLYPECTOMY N/A 11/08/2013   Procedure: POLYPECTOMY;  Surgeon: [name], MD;  Location: [facility];  Service: General;  Laterality: N/A;  cecal polyp  . Removal of kidney stones     Open  . WOUND DEBRIDEMENT Right 10/26/2015   Procedure: DEBRIDEMENT WOUND RIGHT FLANK;  Surgeon: Aviva Signs, MD;  Location: [facility];  Service: General;  Laterality: Right;     HPI  from the history and physical done on the day of admission:   - HPI:  Charles Brooks is a 69 y.o. male with medical history of coronary artery disease with stent, CKD stage III, hyperlipidemia, hypertension presenting with 3 to 4-day history of nausea, vomiting, and diarrhea.  The patient states that his wife was tested Covid positive on 06/24/2019.  He did not began feeling bad until 07/02/2019 when he had development of the above symptoms.  Approximately 1 to 2 days later he began having some dizziness, but he denied any headaches, visual disturbance, dysarthria, or focal extremity weakness.  He denied any hematemesis, hematochezia, melena.  He was having 3-4 loose bowel movements on a daily basis.  He denies any dysuria, hematuria, abdominal pain.  He did not have any chest  pain, shortness of breath, but did have a nonproductive cough.  His dizziness progressed to the point where every time he was standing he would have near syncopal episodes.  As result he presented for further evaluation.  When EMS arrived, the patient was noted to be hypotensive and hypoglycemic. Per EMS, he was given soda with sugar and 3 peanut butter crackers en route to ER. CBG 45>>> 134. In the emergency department, the patient was afebrile and hemodynamically stable with oxygen saturation 100% on room air.  BMP showed a potassium of 5.7 with serum creatinine 3.17.  WBC 7.1, hemoglobin 14.6 platelet, platelets 161,000.  Troponin was 11 with lactic acid 1.1.  LFTs were unremarkable.  Chest x-ray was negative for any acute findings.  Point-of-care Covid test was positive.   Hospital Course:   Brief Summary:- 69 y.o.malewith medical history ofcoronary artery disease with stent, CKD stage III, hyperlipidemia, hypertension presenting with 3 to 4-day history of nausea, vomiting, and diarrhea admitted on 07/04/2018 with acute kidney injury in the setting of GI losses secondary to Covid infection   A/P 1)AKI----acute kidney injury on CKD stage - III   creatinine on admission= 3.17 ,   baseline creatinine = No recent baseline    , creatinine is now= 1.5     , renally adjust medications, avoid nephrotoxic agents /dehydration /hypotension  --Continues to have very good urine output   2)Hyperkalemia--in the setting of acute on chronic kidney failure potassium down to 5.0 from 5.7 after [place] -Stop lisinopril  3)Recent COVID-19 infection--- diagnosed 05/23/2020--- does not meet criteria for remdesivir or steroids at this time, no hypoxia and no significant chest x-ray findings -Asymptomatic from a COVID-19 infection standpoint at this time -Expectant management  4)H/o CAD--- asymptomatic, LAD 50-60%, CFX OK, OM1 80%(small), RCA 100>>0% w/ 2.25x24 mm Synergy DES,  continue aspirin and statin, and  metoprolol,  -Consider echo to evaluate EF when next sees cardiology  5) nausea vomiting diarrhea----rresolved, alert and oriented very well   Disposition--- discharge Home  Code Status : full  Family Communication:     (patient is alert, awake and coherent) Wife notified  Discharge Condition: Stable Follow UP--PCP and cardiology as advised Diet and Activity recommendation:  As advised  Discharge Instructions    Discharge Instructions    Call MD for:  difficulty breathing, headache or visual disturbances   Complete by: As directed    Call MD for:  extreme fatigue   Complete by: As directed    Call MD for:  persistant dizziness or light-headedness   Complete by: As directed    Call MD for:  persistant nausea and vomiting  Complete by: As directed    Call MD for:  severe uncontrolled pain   Complete by: As directed    Call MD for:  temperature >100.4   Complete by: As directed    Diet - low sodium heart healthy   Complete by: As directed    Discharge instructions   Complete by: As directed    1) stop lisinopril due to concerns about worsening kidney function and high potassium levels- 2)Avoid ibuprofen/Advil/Aleve/Motrin/Goody Powders/Naproxen/BC powders/Meloxicam/Diclofenac/Indomethacin and other Nonsteroidal anti-inflammatory medications as these will make you more likely to bleed and can cause stomach ulcers, can also cause Kidney problems.  3) recheck BMP blood test with the primary care physician in about a week from now 4) the next time you see your cardiologist discussed possible need for echocardiogram test 5) You are strongly advised to  to isolate for at least 21 days from the date of your diagnoses with COVID-19 infection--please always wear a mask if you have to go outside the house   Increase activity slowly   Complete by: As directed        Discharge Medications     Allergies as of 07/07/2019      Reactions   Codeine Hives      Medication List      STOP taking these medications   lisinopril 20 MG tablet Commonly known as: ZESTRIL     TAKE these medications   aspirin 81 MG EC tablet Take 1 tablet (81 mg total) by mouth daily with breakfast. What changed: when to take this   atorvastatin 40 MG tablet Commonly known as: LIPITOR Take 1 tablet (40 mg total) by mouth daily.   metoprolol tartrate 25 MG tablet Commonly known as: LOPRESSOR Take 0.5 tablets (12.5 mg total) by mouth 2 (two) times daily.   nitroGLYCERIN 0.4 MG SL tablet Commonly known as: NITROSTAT Place 1 tablet (0.4 mg total) under the tongue every 5 (five) minutes x 3 doses as needed for chest pain.      Major procedures and Radiology Reports - PLEASE review detailed and final reports for all details, in brief -   DG Chest Port 1 View  Result Date: 07/05/2019 CLINICAL DATA:  Dizziness, hypertension EXAM: PORTABLE CHEST 1 VIEW COMPARISON:  None. FINDINGS: The heart size and mediastinal contours are within normal limits. Both lungs are clear. The visualized skeletal structures are unremarkable. IMPRESSION: No active disease. Electronically Signed   By: Kathreen Devoid   On: 07/05/2019 12:22    Micro Results  No results found for this or any previous visit (from the past 240 hour(s)).  Today   Subjective    Hayk Mcquillen today has no new complaints -Ambulated without chest pains palpitations dizziness or dyspnea, O2 sats post ambulation 99 200% on room air          Patient has been seen and examined prior to discharge   Objective   Blood pressure 112/72, pulse 85, temperature 98.9 F (37.2 C), temperature source Oral, resp. rate 18, height 6' (1.829 m), weight 78.7 kg, SpO2 98 %.   Intake/Output Summary (Last 24 hours) at 07/07/2019 1234 Last data filed at 07/07/2019 0858 Gross per 24 hour  Intake 1504.54 ml  Output 1650 ml  Net -145.46 ml   Exam Gen:- Awake Alert, no acute distress  HEENT:- [place].AT, No sclera icterus Neck-Supple Neck,No JVD,.   Lungs-  CTAB , good air movement bilaterally  CV- S1, S2 normal, regular Abd-  +ve B.Sounds, Abd Soft, No  tenderness,    Extremity/Skin:- No  edema,   good pulses Psych-affect is appropriate, oriented x3 Neuro-no new focal deficits, no tremors    Data Review   CBC w Diff:  Lab Results  Component Value Date   WBC 4.7 07/07/2019   HGB 11.9 (L) 07/07/2019   HCT 37.2 (L) 07/07/2019   PLT 128 (L) 07/07/2019   LYMPHOPCT 30 07/07/2019   MONOPCT 6 07/07/2019   EOSPCT 1 07/07/2019   BASOPCT 0 07/07/2019    CMP:  Lab Results  Component Value Date   NA 139 07/07/2019   K 5.0 07/07/2019   CL 116 (H) 07/07/2019   CO2 19 (L) 07/07/2019   BUN 31 (H) 07/07/2019   CREATININE 1.57 (H) 07/07/2019   PROT 5.1 (L) 07/07/2019   ALBUMIN 2.8 (L) 07/07/2019   BILITOT 0.6 07/07/2019   ALKPHOS 37 (L) 07/07/2019   AST 24 07/07/2019   ALT 19 07/07/2019    Total Discharge time is about 33 minutes  Roxan Hockey M.D on 07/07/2019 at 12:34 PM  Go to [email or web address] -  for contact info  [facility] - Office  [phone]

## 2019-07-07 NOTE — Discharge Instructions (Signed)
1)Stop lisinopril due to concerns about worsening kidney function and high potassium levels- 2)Avoid ibuprofen/Advil/Aleve/Motrin/Goody Powders/Naproxen/BC powders/Meloxicam/Diclofenac/Indomethacin and other Nonsteroidal anti-inflammatory medications as these will make you more likely to bleed and can cause stomach ulcers, can also cause Kidney problems.  3) recheck BMP blood test with the primary care physician in about a week from now 4) the next time you see your cardiologist discussed possible need for echocardiogram test 5) You are strongly advised to  to isolate for at least 21 days from the date of your diagnoses with COVID-19 infection--please always wear a mask if you have to go outside the house

## 2019-07-15 DIAGNOSIS — U071 COVID-19: Secondary | ICD-10-CM | POA: Diagnosis not present

## 2019-07-15 DIAGNOSIS — Z09 Encounter for follow-up examination after completed treatment for conditions other than malignant neoplasm: Secondary | ICD-10-CM | POA: Diagnosis not present

## 2019-07-15 DIAGNOSIS — Z713 Dietary counseling and surveillance: Secondary | ICD-10-CM | POA: Diagnosis not present

## 2019-07-15 DIAGNOSIS — Z299 Encounter for prophylactic measures, unspecified: Secondary | ICD-10-CM | POA: Diagnosis not present

## 2019-07-15 DIAGNOSIS — I10 Essential (primary) hypertension: Secondary | ICD-10-CM | POA: Diagnosis not present

## 2019-08-08 ENCOUNTER — Telehealth: Payer: Self-pay | Admitting: Cardiology

## 2019-08-08 NOTE — Telephone Encounter (Signed)
Virtual Visit Pre-Appointment Phone Call  "(Name), I am calling you today to discuss your upcoming appointment. We are currently trying to limit exposure to the virus that causes COVID-19 by seeing patients at home rather than in the office."  1. "What is the BEST phone number to call the day of the visit?" - include this in appointment notes  2. "Do you have or have access to (through a family member/friend) a smartphone with video capability that we can use for your visit?" a. If yes - list this number in appt notes as "cell" (if different from BEST phone #) and list the appointment type as a VIDEO visit in appointment notes b. If no - list the appointment type as a PHONE visit in appointment notes  Confirm consent - "In the setting of the current Covid19 crisis, you are scheduled for a (phone or video) visit with your provider on (date) at (time).  Just as we do with many in-office visits, in order for you to participate in this visit, we must obtain consent.  If you'd like, I can send this to your mychart (if signed up) or email for you to review.  Otherwise, I can obtain your verbal consent now.  All virtual visits are billed to your insurance company just like a normal visit would be.  By agreeing to a virtual visit, we'd like you to understand that the technology does not allow for your provider to perform an examination, and thus may limit your provider's ability to fully assess your condition. If your provider identifies any concerns that need to be evaluated in person, we will make arrangements to do so.  Finally, though the technology is pretty good, we cannot assure that it will always work on either your or our end, and in the setting of a video visit, we may have to convert it to a phone-only visit.  In either situation, we cannot ensure that we have a secure connection.  Are you willing to proceed?" STAFF: Did the patient verbally acknowledge consent to telehealth visit? Document  YES/NO here: yes 3. Advise patient to be prepared - "Two hours prior to your appointment, go ahead and check your blood pressure, pulse, oxygen saturation, and your weight (if you have the equipment to check those) and write them all down. When your visit starts, your provider will ask you for this information. If you have an Apple Watch or Kardia device, please plan to have heart rate information ready on the day of your appointment. Please have a pen and paper handy nearby the day of the visit as well."  4. Give patient instructions for MyChart download to smartphone OR Doximity/Doxy.me as below if video visit (depending on what platform provider is using)  5. Inform patient they will receive a phone call 15 minutes prior to their appointment time (may be from unknown caller ID) so they should be prepared to answer    [facility] has been deemed a candidate for a follow-up tele-health visit to limit community exposure during the Covid-19 pandemic. I spoke with the patient via phone to ensure availability of phone/video source, confirm preferred email & phone number, and discuss instructions and expectations.  I reminded [name] to be prepared with any vital sign and/or heart rhythm information that could potentially be obtained via home monitoring, at the time of his visit. I reminded JERRIAN BURGES to expect a phone call prior to his visit.  Weston Anna 08/08/2019 2:25 PM   INSTRUCTIONS FOR DOWNLOADING THE MYCHART APP TO SMARTPHONE  - The patient must first make sure to have activated MyChart and know their login information - If Apple, go to [employer] and type in MyChart in the search bar and download the app. If Android, ask patient to go to [employer] and type in [place] in the search bar and download the app. The app is free but as with any other app downloads, their phone may require them to verify saved payment information or Apple/Android  password.  - The patient will need to then log into the app with their MyChart username and password, and select [facility] as their healthcare provider to link the account. When it is time for your visit, go to the MyChart app, find appointments, and click Begin Video Visit. Be sure to Select Allow for your device to access the Microphone and Camera for your visit. You will then be connected, and your provider will be with you shortly.  **If they have any issues connecting, or need assistance please contact MyChart service desk [phone])**  **If using a computer, in order to ensure the best quality for their visit they will need to use either of the following Internet Browsers: [employer], or Google Chrome**  IF USING DOXIMITY or DOXY.ME - The patient will receive a link just prior to their visit by text.     FULL LENGTH CONSENT FOR TELE-HEALTH VISIT   I hereby voluntarily request, consent and authorize [facility] and its employed or contracted physicians, physician assistants, nurse practitioners or other licensed health care professionals (the Practitioner), to provide me with telemedicine health care services (the "Services") as deemed necessary by the treating Practitioner. I acknowledge and consent to receive the Services by the Practitioner via telemedicine. I understand that the telemedicine visit will involve communicating with the Practitioner through live audiovisual communication technology and the disclosure of certain medical information by electronic transmission. I acknowledge that I have been given the opportunity to request an in-person assessment or other available alternative prior to the telemedicine visit and am voluntarily participating in the telemedicine visit.  I understand that I have the right to withhold or withdraw my consent to the use of telemedicine in the course of my care at any time, without affecting my right to future care or treatment,  and that the Practitioner or I may terminate the telemedicine visit at any time. I understand that I have the right to inspect all information obtained and/or recorded in the course of the telemedicine visit and may receive copies of available information for a reasonable fee.  I understand that some of the potential risks of receiving the Services via telemedicine include:  Marland Kitchen Delay or interruption in medical evaluation due to technological equipment failure or disruption; . Information transmitted may not be sufficient (e.g. poor resolution of images) to allow for appropriate medical decision making by the Practitioner; and/or  . In rare instances, security protocols could fail, causing a breach of personal health information.  Furthermore, I acknowledge that it is my responsibility to provide information about my medical history, conditions and care that is complete and accurate to the best of my ability. I acknowledge that Practitioner's advice, recommendations, and/or decision may be based on factors not within their control, such as incomplete or inaccurate data provided by me or distortions of diagnostic images or specimens that may result from electronic transmissions. I understand that the  practice of medicine is not an Chief Strategy Officer and that Practitioner makes no warranties or guarantees regarding treatment outcomes. I acknowledge that I will receive a copy of this consent concurrently upon execution via email to the email address I last provided but may also request a printed copy by calling the office of [facility].    I understand that my insurance will be billed for this visit.   I have read or had this consent read to me. . I understand the contents of this consent, which adequately explains the benefits and risks of the Services being provided via telemedicine.  . I have been provided ample opportunity to ask questions regarding this consent and the Services and have had my questions  answered to my satisfaction. . I give my informed consent for the services to be provided through the use of telemedicine in my medical care  By participating in this telemedicine visit I agree to the above.

## 2019-08-14 ENCOUNTER — Telehealth: Payer: PPO | Admitting: Cardiology

## 2019-08-20 ENCOUNTER — Encounter: Payer: Self-pay | Admitting: Cardiology

## 2019-08-20 NOTE — Progress Notes (Signed)
Virtual Visit via Telephone Note   This visit type was conducted due to national recommendations for restrictions regarding the COVID-19 Pandemic (e.g. social distancing) in an effort to limit this patient's exposure and mitigate transmission in our community.  Due to his co-morbid illnesses, this patient is at least at moderate risk for complications without adequate follow up.  This format is felt to be most appropriate for this patient at this time.  The patient did not have access to video technology/had technical difficulties with video requiring transitioning to audio format only (telephone).  All issues noted in this document were discussed and addressed.  No physical exam could be performed with this format.  Please refer to the patient's chart for his  consent to telehealth for [facility].   Date:  08/21/2019   ID:  Charles Brooks, DOB Jan 21, 1951, MRN [id number]  Patient Location: Home Provider Location: Office  PCP:  [name], MD  Cardiologist:  Rozann Lesches, MD Electrophysiologist:  None   Evaluation Performed:  Follow-Up Visit  Chief Complaint:  Cardiac follow-up  History of Present Illness:    Charles Brooks is a 69 y.o. male last seen in January 2020.  We spoke by phone today.  I reviewed his records, he was hospitalized in January with COVID-19.  He had electrolyte abnormalities and also acute on chronic renal insufficiency.  With relatively low blood pressures he was taken off lisinopril as well.  No arrhythmias described and his high-sensitivity troponin I levels were negative arguing against myocarditis.  There is mention in the discharge summary of considering an echocardiogram, however he states that he has improved back to baseline and does not report any shortness of breath.  He does not have a home blood pressure cuff to follow-up on blood pressure measurements.  I reviewed his current medications which are listed below and otherwise stable from a cardiac  perspective.  He does not report any angina or nitroglycerin use.   Past Medical History:  Diagnosis Date   Arthritis    CAD (coronary artery disease), native coronary artery    DES RCA and PTCA RVM 07/2014, LVEF 55%   CKD (chronic kidney disease) stage 3, GFR 30-59 ml/min    Essential hypertension    Hyperlipidemia    STEMI (ST elevation myocardial infarction) ([place])    07/2014   Past Surgical History:  Procedure Laterality Date   CHOLECYSTECTOMY     COLONOSCOPY WITH PROPOFOL N/A 11/08/2013   Procedure: COLONOSCOPY WITH PROPOFOL;  Surgeon: [name], MD;  Location: [facility];  Service: General;  Laterality: N/A;  in cecum at 0741; cecal withdrawal time = 10 min   EXTRACORPOREAL SHOCK WAVE LITHOTRIPSY     INCISION AND DRAINAGE ABSCESS Right 08/12/2015   Procedure: INCISION AND DRAINAGE ABSCESS;  Surgeon: Aviva Signs, MD;  Location: [facility];  Service: General;  Laterality: Right;   KNEE ARTHROSCOPY Right    LEFT HEART CATHETERIZATION WITH CORONARY ANGIOGRAM N/A 08/14/2014   Procedure: LEFT HEART CATHETERIZATION WITH CORONARY ANGIOGRAM;  Surgeon: Troy Sine, MD;  LAD 50-60%, CFX OK, OM1 80%(small), RCA 100>>0% w/ 2.25x24 mm Synergy DES, EF 55%   POLYPECTOMY N/A 11/08/2013   Procedure: POLYPECTOMY;  Surgeon: [name], MD;  Location: [facility];  Service: General;  Laterality: N/A;  cecal polyp   Removal of kidney stones     Open   WOUND DEBRIDEMENT Right 10/26/2015   Procedure: DEBRIDEMENT WOUND RIGHT FLANK;  Surgeon: Aviva Signs, MD;  Location: [facility];  Service: General;  Laterality: Right;     Current Meds  Medication Sig   aspirin 81 MG EC tablet Take 1 tablet (81 mg total) by mouth daily with breakfast.   atorvastatin (LIPITOR) 40 MG tablet Take 1 tablet (40 mg total) by mouth daily.   metoprolol tartrate (LOPRESSOR) 25 MG tablet Take 0.5 tablets (12.5 mg total) by mouth 2 (two) times daily.   nitroGLYCERIN (NITROSTAT) 0.4 MG SL tablet Place 1 tablet (0.4 mg  total) under the tongue every 5 (five) minutes x 3 doses as needed for chest pain.     Allergies:   Codeine   ROS:   No palpitations or syncope.   Prior CV studies:   The following studies were reviewed today:  Cardiac catheterization 08/14/2014: Left main: Angiographically normal vessel which bifurcated into a large LAD and a large dominant left circumflex coronary artery.  LAD: LAD gave rise to 2 proximal diagonal vessels. Immediately after the takeoff of this second diagonal vessel. The LAD had 50-60% stenosis proximal to a septal perforating artery. The remainder of the LAD was free of significant disease and extended to the LV apex.  Left circumflex: Circumflex was large, dominant vessel that had a diffuse 80% stenosis in a small OM1 branch. The vessel supplied several additional marginal branches and ended in a posterolateral large caliber vessel.   Right coronary artery: Nondominant vessel that had total proximal occlusion with TIMI 0 flow initially.  Following PCI to the RCA, the very proximal RCA was opened. There was total occlusion of a marginal like branch just beyond the bifurcation of a smaller distal RCA branch. He said sites were dilated. There also is a distal 90% marginal branch stenosis which underwent PTCA to the vessel was small caliber and the distal 90% stenosis was reduced to 40%. Will RCA extending into this marginal vessel was dilated with the 2.2524 mm Synergy DES stent with entire region been reduced to 0% with brisk TIMI-3 flow.  Left ventriculography revealed preserved LV contractility. Ejection fraction is 55%. There was no evidence for mitral regurgitation.  Labs/Other Tests and Data Reviewed:    EKG:  An ECG dated 07/05/2019 was personally reviewed today and demonstrated:  Sinus rhythm with low voltage.  Recent Labs: 07/07/2019: ALT 19; BUN 31; Creatinine, Ser 1.57; Hemoglobin 11.9; Platelets 128; Potassium 5.0; Sodium 139   Recent Lipid  Panel Lab Results  Component Value Date/Time   CHOL 176 08/14/2014 07:30 AM   TRIG 311 (H) 08/14/2014 07:30 AM   HDL 24 (L) 08/14/2014 07:30 AM   CHOLHDL 7.3 08/14/2014 07:30 AM   LDLCALC 90 08/14/2014 07:30 AM    Wt Readings from Last 3 Encounters:  08/21/19 180 lb (81.6 kg)  07/05/19 173 lb 8 oz (78.7 kg)  06/29/18 186 lb (84.4 kg)     Objective:    Vital Signs:  Ht 6' (1.829 m)    Wt 180 lb (81.6 kg)    BMI 24.41 kg/m    Not able to obtain vital signs today. Patient spoke in full sentences, not short of breath. No audible wheezing or coughing. Speech pattern normal.  ASSESSMENT & PLAN:    1.  CAD with history of DES to the RCA and angioplasty of the RV marginal in 2016.  He does not report any significant angina symptoms or nitroglycerin use.  Continue aspirin, Lipitor, Lopressor, and as needed nitroglycerin.  2.  COVID-19 infection in January, recovered.  He was briefly hospitalized.  No chest pain or arrhythmias noted, high-sensitivity troponin I levels normal.  No clear indication to suspect myocarditis.  3.  Essential hypertension, taken off ACE inhibitor during hospital stay in January with relative hypotension, electrolyte abnormalities, and acute on chronic renal insufficiency.  I recommended a follow-up with [name] for further blood pressure monitoring and repeat lab work.   Time:   Today, I have spent 6 minutes with the patient with telehealth technology discussing the above problems.     Medication Adjustments/Labs and Tests Ordered: Current medicines are reviewed at length with the patient today.  Concerns regarding medicines are outlined above.   Tests Ordered: No orders of the defined types were placed in this encounter.   Medication Changes: No orders of the defined types were placed in this encounter.   Follow Up:  In Person 6 months in the [place] office.  Signed, Rozann Lesches, MD  08/21/2019 1:12 PM    [facility]

## 2019-08-21 ENCOUNTER — Encounter: Payer: Self-pay | Admitting: Cardiology

## 2019-08-21 ENCOUNTER — Telehealth (INDEPENDENT_AMBULATORY_CARE_PROVIDER_SITE_OTHER): Payer: PPO | Admitting: Cardiology

## 2019-08-21 VITALS — Ht 72.0 in | Wt 180.0 lb

## 2019-08-21 DIAGNOSIS — I25119 Atherosclerotic heart disease of native coronary artery with unspecified angina pectoris: Secondary | ICD-10-CM

## 2019-08-21 DIAGNOSIS — I10 Essential (primary) hypertension: Secondary | ICD-10-CM | POA: Diagnosis not present

## 2019-08-21 DIAGNOSIS — E782 Mixed hyperlipidemia: Secondary | ICD-10-CM

## 2019-08-21 DIAGNOSIS — N1832 Chronic kidney disease, stage 3b: Secondary | ICD-10-CM

## 2019-08-21 NOTE — Patient Instructions (Addendum)
Medication Instructions:   Your physician recommends that you continue on your current medications as directed. Please refer to the Current Medication list given to you today.  Labwork:  NONE  Testing/Procedures:  NONE  Follow-Up:  Your physician recommends that you schedule a follow-up appointment in: 6 months (office). You will receive a reminder letter in the mail in about 4 months reminding you to call and schedule your appointment. If you don't receive this letter, please contact our office.  Any Other Special Instructions Will Be Listed Below (If Applicable).  Please follow up with [name] about your blood pressure and your lab work.   If you need a refill on your cardiac medications before your next appointment, please call your pharmacy.

## 2019-08-28 DIAGNOSIS — E7800 Pure hypercholesterolemia, unspecified: Secondary | ICD-10-CM | POA: Diagnosis not present

## 2019-08-28 DIAGNOSIS — I10 Essential (primary) hypertension: Secondary | ICD-10-CM | POA: Diagnosis not present

## 2019-09-19 DIAGNOSIS — D692 Other nonthrombocytopenic purpura: Secondary | ICD-10-CM | POA: Diagnosis not present

## 2019-09-19 DIAGNOSIS — Z299 Encounter for prophylactic measures, unspecified: Secondary | ICD-10-CM | POA: Diagnosis not present

## 2019-09-19 DIAGNOSIS — Z87891 Personal history of nicotine dependence: Secondary | ICD-10-CM | POA: Diagnosis not present

## 2019-09-19 DIAGNOSIS — I10 Essential (primary) hypertension: Secondary | ICD-10-CM | POA: Diagnosis not present

## 2019-10-03 DIAGNOSIS — E7800 Pure hypercholesterolemia, unspecified: Secondary | ICD-10-CM | POA: Diagnosis not present

## 2019-10-03 DIAGNOSIS — I10 Essential (primary) hypertension: Secondary | ICD-10-CM | POA: Diagnosis not present

## 2019-11-17 DIAGNOSIS — I10 Essential (primary) hypertension: Secondary | ICD-10-CM | POA: Diagnosis not present

## 2019-11-17 DIAGNOSIS — E7800 Pure hypercholesterolemia, unspecified: Secondary | ICD-10-CM | POA: Diagnosis not present

## 2019-12-13 ENCOUNTER — Encounter: Payer: Self-pay | Admitting: Cardiology

## 2019-12-13 DIAGNOSIS — R5383 Other fatigue: Secondary | ICD-10-CM | POA: Diagnosis not present

## 2019-12-13 DIAGNOSIS — Z1331 Encounter for screening for depression: Secondary | ICD-10-CM | POA: Diagnosis not present

## 2019-12-13 DIAGNOSIS — Z6824 Body mass index (BMI) 24.0-24.9, adult: Secondary | ICD-10-CM | POA: Diagnosis not present

## 2019-12-13 DIAGNOSIS — Z1211 Encounter for screening for malignant neoplasm of colon: Secondary | ICD-10-CM | POA: Diagnosis not present

## 2019-12-13 DIAGNOSIS — Z0000 Encounter for general adult medical examination without abnormal findings: Secondary | ICD-10-CM | POA: Diagnosis not present

## 2019-12-13 DIAGNOSIS — Z299 Encounter for prophylactic measures, unspecified: Secondary | ICD-10-CM | POA: Diagnosis not present

## 2019-12-13 DIAGNOSIS — E7800 Pure hypercholesterolemia, unspecified: Secondary | ICD-10-CM | POA: Diagnosis not present

## 2019-12-13 DIAGNOSIS — Z1339 Encounter for screening examination for other mental health and behavioral disorders: Secondary | ICD-10-CM | POA: Diagnosis not present

## 2019-12-13 DIAGNOSIS — Z7189 Other specified counseling: Secondary | ICD-10-CM | POA: Diagnosis not present

## 2019-12-13 DIAGNOSIS — I10 Essential (primary) hypertension: Secondary | ICD-10-CM | POA: Diagnosis not present

## 2019-12-13 DIAGNOSIS — Z79899 Other long term (current) drug therapy: Secondary | ICD-10-CM | POA: Diagnosis not present

## 2019-12-13 DIAGNOSIS — Z125 Encounter for screening for malignant neoplasm of prostate: Secondary | ICD-10-CM | POA: Diagnosis not present

## 2019-12-13 DIAGNOSIS — N1832 Chronic kidney disease, stage 3b: Secondary | ICD-10-CM | POA: Diagnosis not present

## 2019-12-18 DIAGNOSIS — I10 Essential (primary) hypertension: Secondary | ICD-10-CM | POA: Diagnosis not present

## 2019-12-18 DIAGNOSIS — E7800 Pure hypercholesterolemia, unspecified: Secondary | ICD-10-CM | POA: Diagnosis not present

## 2020-01-17 DIAGNOSIS — E7800 Pure hypercholesterolemia, unspecified: Secondary | ICD-10-CM | POA: Diagnosis not present

## 2020-01-17 DIAGNOSIS — I10 Essential (primary) hypertension: Secondary | ICD-10-CM | POA: Diagnosis not present

## 2020-01-31 DIAGNOSIS — I10 Essential (primary) hypertension: Secondary | ICD-10-CM | POA: Diagnosis not present

## 2020-01-31 DIAGNOSIS — E7800 Pure hypercholesterolemia, unspecified: Secondary | ICD-10-CM | POA: Diagnosis not present

## 2020-02-21 ENCOUNTER — Other Ambulatory Visit: Payer: Self-pay

## 2020-02-21 ENCOUNTER — Ambulatory Visit: Payer: PPO | Admitting: Cardiology

## 2020-02-21 ENCOUNTER — Encounter: Payer: Self-pay | Admitting: Cardiology

## 2020-02-21 VITALS — BP 155/75 | HR 56 | Ht 72.0 in | Wt 188.0 lb

## 2020-02-21 DIAGNOSIS — N1832 Chronic kidney disease, stage 3b: Secondary | ICD-10-CM

## 2020-02-21 DIAGNOSIS — I25119 Atherosclerotic heart disease of native coronary artery with unspecified angina pectoris: Secondary | ICD-10-CM

## 2020-02-21 DIAGNOSIS — I10 Essential (primary) hypertension: Secondary | ICD-10-CM

## 2020-02-21 NOTE — Progress Notes (Signed)
Cardiology Office Note  Date: 02/21/2020   ID: Charles Brooks, Charles Brooks Nov 04, 1950, MRN [id number]  PCP:  [name], MD  Cardiologist:  Rozann Lesches, MD Electrophysiologist:  None   Chief Complaint  Patient presents with  . Cardiac follow-up    History of Present Illness: Charles Brooks is a 69 y.o. male last assessed via telehealth encounter in March.  He presents for a routine visit.  States that he is doing well, no angina or nitroglycerin use.  Has been working in his yard recently, no major limitations.  Blood pressure is elevated today.  He has a wellness visit with PCP soon.  I asked him to check daily blood pressures for a week prior to that visit.  He had been taken off ACE inhibitor during previous hospitalization in January with relative hypotension, he may need to resume at this point.  Otherwise her cardiac regimen is stable as outlined below.  Past Medical History:  Diagnosis Date  . Arthritis   . CAD (coronary artery disease), native coronary artery    DES RCA and PTCA RVM 07/2014, LVEF 55%  . CKD (chronic kidney disease) stage 3, GFR 30-59 ml/min   . Essential hypertension   . Hyperlipidemia   . STEMI (ST elevation myocardial infarction) ([place])    07/2014    Past Surgical History:  Procedure Laterality Date  . CHOLECYSTECTOMY    . COLONOSCOPY WITH PROPOFOL N/A 11/08/2013   Procedure: COLONOSCOPY WITH PROPOFOL;  Surgeon: [name], MD;  Location: [facility];  Service: General;  Laterality: N/A;  in cecum at 0741; cecal withdrawal time = 10 min  . EXTRACORPOREAL SHOCK WAVE LITHOTRIPSY    . INCISION AND DRAINAGE ABSCESS Right 08/12/2015   Procedure: INCISION AND DRAINAGE ABSCESS;  Surgeon: Aviva Signs, MD;  Location: [facility];  Service: General;  Laterality: Right;  . KNEE ARTHROSCOPY Right   . LEFT HEART CATHETERIZATION WITH CORONARY ANGIOGRAM N/A 08/14/2014   Procedure: LEFT HEART CATHETERIZATION WITH CORONARY ANGIOGRAM;  Surgeon: Troy Sine, MD;  LAD  50-60%, CFX OK, OM1 80%(small), RCA 100>>0% w/ 2.25x24 mm Synergy DES, EF 55%  . POLYPECTOMY N/A 11/08/2013   Procedure: POLYPECTOMY;  Surgeon: [name], MD;  Location: [facility];  Service: General;  Laterality: N/A;  cecal polyp  . Removal of kidney stones     Open  . WOUND DEBRIDEMENT Right 10/26/2015   Procedure: DEBRIDEMENT WOUND RIGHT FLANK;  Surgeon: Aviva Signs, MD;  Location: [facility];  Service: General;  Laterality: Right;    Current Outpatient Medications  Medication Sig Dispense Refill  . aspirin 81 MG EC tablet Take 1 tablet (81 mg total) by mouth daily with breakfast. 30 tablet 1  . atorvastatin (LIPITOR) 40 MG tablet Take 1 tablet (40 mg total) by mouth daily. 10 tablet 0  . metoprolol tartrate (LOPRESSOR) 25 MG tablet Take 0.5 tablets (12.5 mg total) by mouth 2 (two) times daily. 45 tablet 11  . nitroGLYCERIN (NITROSTAT) 0.4 MG SL tablet Place 1 tablet (0.4 mg total) under the tongue every 5 (five) minutes x 3 doses as needed for chest pain. 25 tablet 12   No current facility-administered medications for this visit.   Allergies:  Codeine   ROS:   No palpitations or syncope.  Physical Exam: VS:  BP (!) 155/75   Pulse (!) 56   Ht 6' (1.829 m)   Wt 188 lb (85.3 kg)   SpO2 98%   BMI 25.50 kg/m ,  BMI Body mass index is 25.5 kg/m.  Wt Readings from Last 3 Encounters:  02/21/20 188 lb (85.3 kg)  08/21/19 180 lb (81.6 kg)  07/05/19 173 lb 8 oz (78.7 kg)    General: Patient appears comfortable at rest. HEENT: Conjunctiva and lids normal, wearing a mask. Neck: Supple, no elevated JVP or carotid bruits, no thyromegaly. Lungs: Clear to auscultation, nonlabored breathing at rest. Cardiac: Regular rate and rhythm, no S3 or significant systolic murmur, no pericardial rub. Extremities: No pitting edema, distal pulses 2+.  ECG:  An ECG dated 07/05/2019 was personally reviewed today and demonstrated:  Sinus rhythm with low voltage.  Recent Labwork: 07/07/2019: ALT 19; AST 24;  BUN 31; Creatinine, Ser 1.57; Hemoglobin 11.9; Platelets 128; Potassium 5.0; Sodium 139     Component Value Date/Time   CHOL 176 08/14/2014 0730   TRIG 311 (H) 08/14/2014 0730   HDL 24 (L) 08/14/2014 0730   CHOLHDL 7.3 08/14/2014 0730   VLDL 62 (H) 08/14/2014 0730   LDLCALC 90 08/14/2014 0730    Other Studies Reviewed Today:  Cardiac catheterization 08/14/2014: Left main: Angiographically normal vessel which bifurcated into a large LAD and a large dominant left circumflex coronary artery.  LAD: LAD gave rise to 2 proximal diagonal vessels. Immediately after the takeoff of this second diagonal vessel. The LAD had 50-60% stenosis proximal to a septal perforating artery. The remainder of the LAD was free of significant disease and extended to the LV apex.  Left circumflex: Circumflex was large, dominant vessel that had a diffuse 80% stenosis in a small OM1 branch. The vessel supplied several additional marginal branches and ended in a posterolateral large caliber vessel.   Right coronary artery: Nondominant vessel that had total proximal occlusion with TIMI 0 flow initially.  Following PCI to the RCA, the very proximal RCA was opened. There was total occlusion of a marginal like branch just beyond the bifurcation of a smaller distal RCA branch. He said sites were dilated. There also is a distal 90% marginal branch stenosis which underwent PTCA to the vessel was small caliber and the distal 90% stenosis was reduced to 40%. Will RCA extending into this marginal vessel was dilated with the 2.2524 mm Synergy DES stent with entire region been reduced to 0% with brisk TIMI-3 flow.  Left ventriculography revealed preserved LV contractility. Ejection fraction is 55%. There was no evidence for mitral regurgitation.  Assessment and Plan:  1.  CAD status post DES to the RCA and angioplasty of the RV marginal in 2016.  He is doing well without angina symptoms at this time on medical  therapy.  Continue aspirin, Lopressor, and Lipitor.  He has nitroglycerin available.  2.  Essential hypertension, blood pressure is up today.  I asked him to check blood pressure daily for the week preceding his wellness check with PCP.  May be able to resume low-dose ACE inhibitor or ARB at this time.  Medication Adjustments/Labs and Tests Ordered: Current medicines are reviewed at length with the patient today.  Concerns regarding medicines are outlined above.   Tests Ordered: No orders of the defined types were placed in this encounter.   Medication Changes: No orders of the defined types were placed in this encounter.   Disposition:  Follow up 6 months in the [place] office.  Signed, Satira Sark, MD, [facility] 02/21/2020 2:16 PM    [facility] at [facility] [address], [place], [place] [address] Phone: [phone]; Fax: [phone])  [phone] 

## 2020-02-21 NOTE — Patient Instructions (Signed)
Medication Instructions:  °Your physician recommends that you continue on your current medications as directed. Please refer to the Current Medication list given to you today. ° °*If you need a refill on your cardiac medications before your next appointment, please call your pharmacy* ° ° °Lab Work: °None today °If you have labs (blood work) drawn today and your tests are completely normal, you will receive your results only by: °• MyChart Message (if you have MyChart) OR °• A paper copy in the mail °If you have any lab test that is abnormal or we need to change your treatment, we will call you to review the results. ° ° °Testing/Procedures: °None today ° ° °Follow-Up: °At [facility], you and your health needs are our priority.  As part of our continuing mission to provide you with exceptional heart care, we have created designated Provider Care Teams.  These Care Teams include your primary Cardiologist (physician) and Advanced Practice Providers (APPs -  Physician Assistants and Nurse Practitioners) who all work together to provide you with the care you need, when you need it. ° °We recommend signing up for the patient portal called "MyChart".  Sign up information is provided on this After Visit Summary.  MyChart is used to connect with patients for Virtual Visits (Telemedicine).  Patients are able to view lab/test results, encounter notes, upcoming appointments, etc.  Non-urgent messages can be sent to your provider as well.   °To learn more about what you can do with MyChart, go to [email or web address].   ° °Your next appointment:   °6 month(s) ° °The format for your next appointment:   °In Person ° °Provider:   °Samuel McDowell, MD ° ° °Other Instructions °None ° ° ° ° °Thank you for choosing [facility] ! ° ° ° ° ° ° ° ° °

## 2020-03-19 DIAGNOSIS — E7800 Pure hypercholesterolemia, unspecified: Secondary | ICD-10-CM | POA: Diagnosis not present

## 2020-03-19 DIAGNOSIS — I10 Essential (primary) hypertension: Secondary | ICD-10-CM | POA: Diagnosis not present

## 2020-03-23 DIAGNOSIS — D692 Other nonthrombocytopenic purpura: Secondary | ICD-10-CM | POA: Diagnosis not present

## 2020-03-23 DIAGNOSIS — I10 Essential (primary) hypertension: Secondary | ICD-10-CM | POA: Diagnosis not present

## 2020-03-23 DIAGNOSIS — N1832 Chronic kidney disease, stage 3b: Secondary | ICD-10-CM | POA: Diagnosis not present

## 2020-03-23 DIAGNOSIS — Z299 Encounter for prophylactic measures, unspecified: Secondary | ICD-10-CM | POA: Diagnosis not present

## 2020-04-17 DIAGNOSIS — E7800 Pure hypercholesterolemia, unspecified: Secondary | ICD-10-CM | POA: Diagnosis not present

## 2020-04-17 DIAGNOSIS — I10 Essential (primary) hypertension: Secondary | ICD-10-CM | POA: Diagnosis not present

## 2020-05-10 IMAGING — DX DG CHEST 1V PORT
1 series · 1 of 1 positions shown · non-contrast
Comparison: None.

CLINICAL DATA: Dizziness, hypertension

EXAM:
PORTABLE CHEST 1 VIEW

[chest ap]
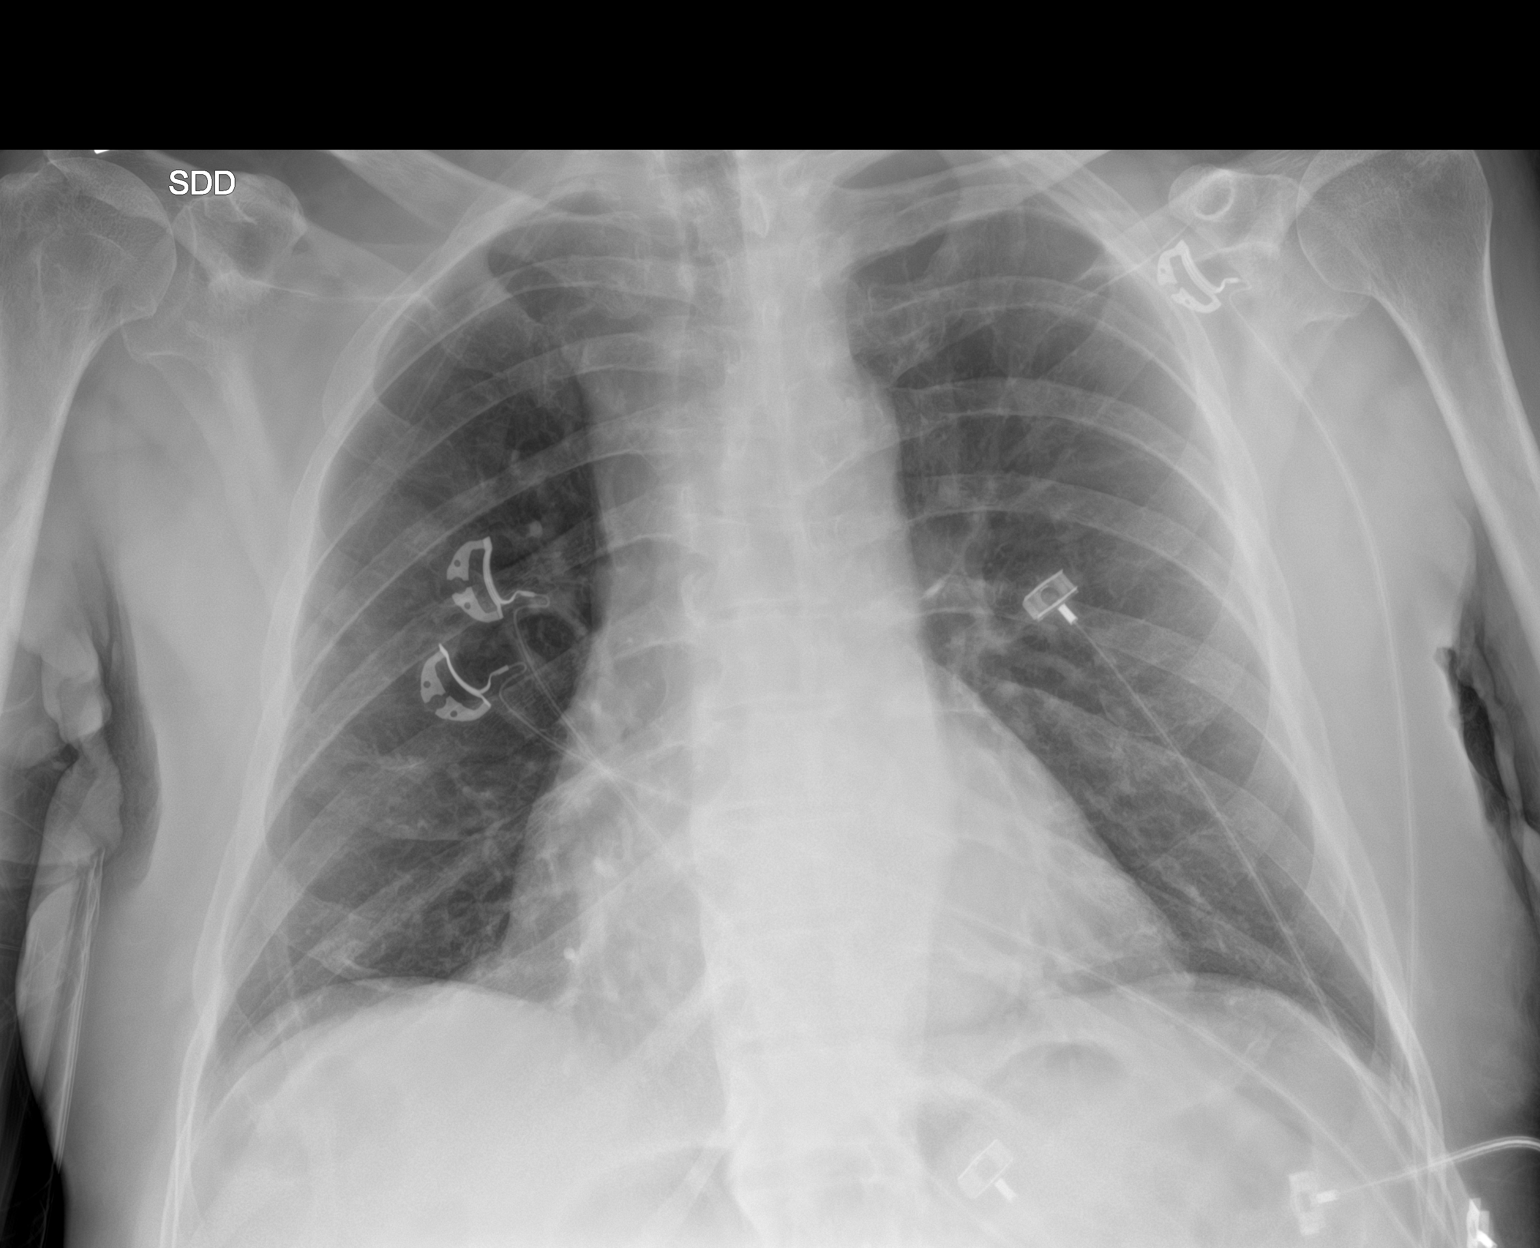

[1 of 1 positions shown; findings below may reference images not displayed]

FINDINGS: The heart size and mediastinal contours are within normal limits.
Both lungs are clear. The visualized skeletal structures are
unremarkable.
IMPRESSION: No active disease.

## 2020-05-19 DIAGNOSIS — E7800 Pure hypercholesterolemia, unspecified: Secondary | ICD-10-CM | POA: Diagnosis not present

## 2020-05-19 DIAGNOSIS — I10 Essential (primary) hypertension: Secondary | ICD-10-CM | POA: Diagnosis not present

## 2020-06-18 DIAGNOSIS — E7800 Pure hypercholesterolemia, unspecified: Secondary | ICD-10-CM | POA: Diagnosis not present

## 2020-06-18 DIAGNOSIS — I10 Essential (primary) hypertension: Secondary | ICD-10-CM | POA: Diagnosis not present

## 2020-07-01 DIAGNOSIS — Z720 Tobacco use: Secondary | ICD-10-CM | POA: Diagnosis not present

## 2020-07-01 DIAGNOSIS — I10 Essential (primary) hypertension: Secondary | ICD-10-CM | POA: Diagnosis not present

## 2020-07-01 DIAGNOSIS — N1832 Chronic kidney disease, stage 3b: Secondary | ICD-10-CM | POA: Diagnosis not present

## 2020-07-01 DIAGNOSIS — M1990 Unspecified osteoarthritis, unspecified site: Secondary | ICD-10-CM | POA: Diagnosis not present

## 2020-07-01 DIAGNOSIS — Z299 Encounter for prophylactic measures, unspecified: Secondary | ICD-10-CM | POA: Diagnosis not present

## 2020-07-01 DIAGNOSIS — G629 Polyneuropathy, unspecified: Secondary | ICD-10-CM | POA: Diagnosis not present

## 2020-07-01 DIAGNOSIS — Z6826 Body mass index (BMI) 26.0-26.9, adult: Secondary | ICD-10-CM | POA: Diagnosis not present

## 2020-08-17 DIAGNOSIS — E7800 Pure hypercholesterolemia, unspecified: Secondary | ICD-10-CM | POA: Diagnosis not present

## 2020-08-17 DIAGNOSIS — M1990 Unspecified osteoarthritis, unspecified site: Secondary | ICD-10-CM | POA: Diagnosis not present

## 2020-08-17 DIAGNOSIS — I10 Essential (primary) hypertension: Secondary | ICD-10-CM | POA: Diagnosis not present

## 2020-08-19 NOTE — Progress Notes (Signed)
Cardiology Office Note  Date: 08/20/2020   ID: Lavell, Supple 1951-01-03, MRN [id number]  PCP:  [name], MD  Cardiologist:  Rozann Lesches, MD Electrophysiologist:  None   Chief Complaint  Patient presents with  . Cardiac follow-up    History of Present Illness: Charles Brooks is a 70 y.o. male last seen in September 2021.  He presents for a routine follow-up visit.  Reports no angina symptoms nitroglycerin use with typical ADLs.  Has not been walking as much during the colder months.  His wife has had some health changes in the last 6 months.  Initially had back surgery last summer and then was diagnosed with breast cancer in the interim, now status post bilateral mastectomy.  He tells me that she is getting back to baseline.  This has obviously been very stressful.  I went over his medications which are listed below.  He reports no intolerances.  We are requesting his interval lab work from [name].  I personally reviewed his ECG today which shows sinus bradycardia with nonspecific ST-T changes.  We have talked about the possibility of surveillance stress testing with PCI last in 2016.  In the absence of angina, he remains comfortable with observation on medical therapy.  Past Medical History:  Diagnosis Date  . Arthritis   . CAD (coronary artery disease), native coronary artery    DES RCA and PTCA RVM 07/2014, LVEF 55%  . CKD (chronic kidney disease) stage 3, GFR 30-59 ml/min (HCC)   . Essential hypertension   . Hyperlipidemia   . STEMI (ST elevation myocardial infarction) ([place])    07/2014    Past Surgical History:  Procedure Laterality Date  . CHOLECYSTECTOMY    . COLONOSCOPY WITH PROPOFOL N/A 11/08/2013   Procedure: COLONOSCOPY WITH PROPOFOL;  Surgeon: [name], MD;  Location: [facility];  Service: General;  Laterality: N/A;  in cecum at 0741; cecal withdrawal time = 10 min  . EXTRACORPOREAL SHOCK WAVE LITHOTRIPSY    . INCISION AND DRAINAGE ABSCESS Right  08/12/2015   Procedure: INCISION AND DRAINAGE ABSCESS;  Surgeon: Aviva Signs, MD;  Location: [facility];  Service: General;  Laterality: Right;  . KNEE ARTHROSCOPY Right   . LEFT HEART CATHETERIZATION WITH CORONARY ANGIOGRAM N/A 08/14/2014   Procedure: LEFT HEART CATHETERIZATION WITH CORONARY ANGIOGRAM;  Surgeon: Troy Sine, MD;  LAD 50-60%, CFX OK, OM1 80%(small), RCA 100>>0% w/ 2.25x24 mm Synergy DES, EF 55%  . POLYPECTOMY N/A 11/08/2013   Procedure: POLYPECTOMY;  Surgeon: [name], MD;  Location: [facility];  Service: General;  Laterality: N/A;  cecal polyp  . Removal of kidney stones     Open  . WOUND DEBRIDEMENT Right 10/26/2015   Procedure: DEBRIDEMENT WOUND RIGHT FLANK;  Surgeon: Aviva Signs, MD;  Location: [facility];  Service: General;  Laterality: Right;    Current Outpatient Medications  Medication Sig Dispense Refill  . aspirin 81 MG EC tablet Take 1 tablet (81 mg total) by mouth daily with breakfast. 30 tablet 1  . atorvastatin (LIPITOR) 40 MG tablet Take 1 tablet (40 mg total) by mouth daily. 10 tablet 0  . metoprolol tartrate (LOPRESSOR) 25 MG tablet Take 0.5 tablets (12.5 mg total) by mouth 2 (two) times daily. 45 tablet 11  . nitroGLYCERIN (NITROSTAT) 0.4 MG SL tablet Place 1 tablet (0.4 mg total) under the tongue every 5 (five) minutes x 3 doses as needed for chest pain. 25 tablet 12  No current facility-administered medications for this visit.   Allergies:  Codeine   ROS: No palpitations or syncope.  Physical Exam: VS:  BP 108/64   Pulse (!) 53   Ht 6' (1.829 m)   Wt 193 lb (87.5 kg)   SpO2 95%   BMI 26.18 kg/m , BMI Body mass index is 26.18 kg/m.  Wt Readings from Last 3 Encounters:  08/20/20 193 lb (87.5 kg)  02/21/20 188 lb (85.3 kg)  08/21/19 180 lb (81.6 kg)    General: Patient appears comfortable at rest. HEENT: Conjunctiva and lids normal, wearing a mask. Neck: Supple, no elevated JVP or carotid bruits, no thyromegaly. Lungs: Clear to auscultation,  nonlabored breathing at rest. Cardiac: Regular rate and rhythm, no S3 or significant systolic murmur, no pericardial rub. Extremities: No pitting edema.  ECG:  An ECG dated 07/05/2019 was personally reviewed today and demonstrated:  Sinus rhythm with low voltage.  Recent Labwork:  No interval lab work for review today.  Other Studies Reviewed Today:  Cardiac catheterization 08/14/2014: Left main: Angiographically normal vessel which bifurcated into a large LAD and a large dominant left circumflex coronary artery.  LAD: LAD gave rise to 2 proximal diagonal vessels. Immediately after the takeoff of this second diagonal vessel. The LAD had 50-60% stenosis proximal to a septal perforating artery. The remainder of the LAD was free of significant disease and extended to the LV apex.  Left circumflex: Circumflex was large, dominant vessel that had a diffuse 80% stenosis in a small OM1 branch. The vessel supplied several additional marginal branches and ended in a posterolateral large caliber vessel.   Right coronary artery: Nondominant vessel that had total proximal occlusion with TIMI 0 flow initially.  Following PCI to the RCA, the very proximal RCA was opened. There was total occlusion of a marginal like branch just beyond the bifurcation of a smaller distal RCA branch. He said sites were dilated. There also is a distal 90% marginal branch stenosis which underwent PTCA to the vessel was small caliber and the distal 90% stenosis was reduced to 40%. Will RCA extending into this marginal vessel was dilated with the 2.2524 mm Synergy DES stent with entire region been reduced to 0% with brisk TIMI-3 flow.  Left ventriculography revealed preserved LV contractility. Ejection fraction is 55%. There was no evidence for mitral regurgitation.  Assessment and Plan:  1.  CAD status post DES to the RCA and angioplasty of the RV marginal in 2016.  He reports no active angina on medical therapy  and remains comfortable with observation.  ECG reviewed.  Continue aspirin, Lopressor, Lipitor, and as needed nitroglycerin.  Requesting interval lab work from PCP.  2.  Essential hypertension, blood pressure is normal today.  No changes made to present regimen.  Medication Adjustments/Labs and Tests Ordered: Current medicines are reviewed at length with the patient today.  Concerns regarding medicines are outlined above.   Tests Ordered: Orders Placed This Encounter  Procedures  . EKG 12-Lead    Medication Changes: No orders of the defined types were placed in this encounter.   Disposition:  Follow up 6 months in the [place] office.  Signed, Satira Sark, MD, [facility] 08/20/2020 9:34 AM    [facility] [address], [place],  [address] Phone: [phone]; Fax: [phone]

## 2020-08-20 ENCOUNTER — Ambulatory Visit: Payer: PPO | Admitting: Cardiology

## 2020-08-20 ENCOUNTER — Encounter: Payer: Self-pay | Admitting: *Deleted

## 2020-08-20 ENCOUNTER — Encounter: Payer: Self-pay | Admitting: Cardiology

## 2020-08-20 VITALS — BP 108/64 | HR 53 | Ht 72.0 in | Wt 193.0 lb

## 2020-08-20 DIAGNOSIS — I25119 Atherosclerotic heart disease of native coronary artery with unspecified angina pectoris: Secondary | ICD-10-CM | POA: Diagnosis not present

## 2020-08-20 DIAGNOSIS — I10 Essential (primary) hypertension: Secondary | ICD-10-CM | POA: Diagnosis not present

## 2020-08-20 NOTE — Patient Instructions (Addendum)

## 2020-09-14 DIAGNOSIS — Z79899 Other long term (current) drug therapy: Secondary | ICD-10-CM | POA: Diagnosis not present

## 2020-09-14 DIAGNOSIS — N132 Hydronephrosis with renal and ureteral calculous obstruction: Secondary | ICD-10-CM | POA: Diagnosis not present

## 2020-09-14 DIAGNOSIS — I700 Atherosclerosis of aorta: Secondary | ICD-10-CM | POA: Diagnosis not present

## 2020-09-14 DIAGNOSIS — Z885 Allergy status to narcotic agent status: Secondary | ICD-10-CM | POA: Diagnosis not present

## 2020-09-14 DIAGNOSIS — Z7982 Long term (current) use of aspirin: Secondary | ICD-10-CM | POA: Diagnosis not present

## 2020-09-14 DIAGNOSIS — R109 Unspecified abdominal pain: Secondary | ICD-10-CM | POA: Diagnosis not present

## 2020-09-14 DIAGNOSIS — N23 Unspecified renal colic: Secondary | ICD-10-CM | POA: Diagnosis not present

## 2020-09-14 DIAGNOSIS — R1031 Right lower quadrant pain: Secondary | ICD-10-CM | POA: Diagnosis not present

## 2020-09-14 DIAGNOSIS — Z299 Encounter for prophylactic measures, unspecified: Secondary | ICD-10-CM | POA: Diagnosis not present

## 2020-09-14 DIAGNOSIS — I313 Pericardial effusion (noninflammatory): Secondary | ICD-10-CM | POA: Diagnosis not present

## 2020-09-14 DIAGNOSIS — I10 Essential (primary) hypertension: Secondary | ICD-10-CM | POA: Diagnosis not present

## 2020-09-14 DIAGNOSIS — K5730 Diverticulosis of large intestine without perforation or abscess without bleeding: Secondary | ICD-10-CM | POA: Diagnosis not present

## 2020-09-18 ENCOUNTER — Encounter: Payer: Self-pay | Admitting: Urology

## 2020-09-18 ENCOUNTER — Other Ambulatory Visit: Payer: Self-pay

## 2020-09-18 ENCOUNTER — Ambulatory Visit (HOSPITAL_COMMUNITY)
Admission: RE | Admit: 2020-09-18 | Discharge: 2020-09-18 | Disposition: A | Discharge status: Home / Self Care | Payer: PPO | Source: Ambulatory Visit | Attending: Urology | Admitting: Urology

## 2020-09-18 ENCOUNTER — Ambulatory Visit (INDEPENDENT_AMBULATORY_CARE_PROVIDER_SITE_OTHER): Payer: PPO | Admitting: Urology

## 2020-09-18 VITALS — BP 185/80 | HR 59 | Temp 97.6°F | Ht 72.0 in | Wt 195.0 lb

## 2020-09-18 DIAGNOSIS — N200 Calculus of kidney: Secondary | ICD-10-CM | POA: Insufficient documentation

## 2020-09-18 DIAGNOSIS — R109 Unspecified abdominal pain: Secondary | ICD-10-CM | POA: Diagnosis not present

## 2020-09-18 LAB — URINALYSIS, ROUTINE W REFLEX MICROSCOPIC
Bilirubin, UA: NEGATIVE
Glucose, UA: NEGATIVE
Ketones, UA: NEGATIVE
Nitrite, UA: NEGATIVE
Protein,UA: NEGATIVE
RBC, UA: NEGATIVE
Specific Gravity, UA: 1.01 (ref 1.005–1.030)
Urobilinogen, Ur: 0.2 mg/dL (ref 0.2–1.0)
pH, UA: 6 (ref 5.0–7.5)

## 2020-09-18 LAB — MICROSCOPIC EXAMINATION
Bacteria, UA: NONE SEEN
RBC, Urine: NONE SEEN /hpf (ref 0–2)

## 2020-09-18 NOTE — Progress Notes (Signed)
Urological Symptom Review  Patient is experiencing the following symptoms: Frequent urination Hard to postpone urine Getting up at night to urinate  Leakage of urine    Review of Systems  Gastrointestinal (upper)  : Negative for upper GI symptoms  Gastrointestinal (lower) : Negative for lower GI symptoms  Constitutional : Negative for symptoms  Skin: Negative for skin symptoms  Eyes: Negative for eye symptoms  Ear/Nose/Throat : Negative for Ear/Nose/Throat symptoms  Hematologic/Lymphatic: Negative for Hematologic/Lymphatic symptoms  Cardiovascular : Negative for cardiovascular symptoms  Respiratory : Negative for respiratory symptoms  Endocrine: Negative for endocrine symptoms  Musculoskeletal: Negative for musculoskeletal symptoms  Neurological: Negative for neurological symptoms  Psychologic: Negative for psychiatric symptoms

## 2020-09-18 NOTE — Progress Notes (Signed)
09/18/2020 9:24 AM   Charles Brooks [birth year] [id number]  Referring provider: [name], MD [place],  [place] [address]  nephrolithiasis  HPI: Charles Brooks is a 70yo here for evaluation for nephrolithiasis. He was previously seen by Dr. Terance Hart 20 years ago. His last stone event was 1999. He started having right flank pain Monday and presented to his PCP and was sent to the ER. He underwent CT which showed a 67mm right distal ureteral calculus. No flank pain currently. Nausea has resolved also.    PMH: Past Medical History:  Diagnosis Date  . Arthritis   . CAD (coronary artery disease), native coronary artery    DES RCA and PTCA RVM 07/2014, LVEF 55%  . CKD (chronic kidney disease) stage 3, GFR 30-59 ml/min (HCC)   . Essential hypertension   . Hyperlipidemia   . STEMI (ST elevation myocardial infarction) ([place])    07/2014    Surgical History: Past Surgical History:  Procedure Laterality Date  . CHOLECYSTECTOMY    . COLONOSCOPY WITH PROPOFOL N/A 11/08/2013   Procedure: COLONOSCOPY WITH PROPOFOL;  Surgeon: [name], MD;  [facility]: [facility];  Service: General;  Laterality: N/A;  in cecum at 0741; cecal withdrawal time = 10 min  . EXTRACORPOREAL SHOCK WAVE LITHOTRIPSY    . INCISION AND DRAINAGE ABSCESS Right 08/12/2015   Procedure: INCISION AND DRAINAGE ABSCESS;  Surgeon: Aviva Signs, MD;  [facility];  Service: General;  Laterality: Right;  . KNEE ARTHROSCOPY Right   . LEFT HEART CATHETERIZATION WITH CORONARY ANGIOGRAM N/A 08/14/2014   Procedure: LEFT HEART CATHETERIZATION WITH CORONARY ANGIOGRAM;  Surgeon: Troy Sine, MD;  LAD 50-60%, CFX OK, OM1 80%(small), RCA 100>>0% w/ 2.25x24 mm Synergy DES, EF 55%  . POLYPECTOMY N/A 11/08/2013   Procedure: POLYPECTOMY;  Surgeon: [name], MD;  Location: [facility];  Service: General;  Laterality: N/A;  cecal polyp  . Removal of kidney stones     Open  . WOUND DEBRIDEMENT Right 10/26/2015   Procedure: DEBRIDEMENT  WOUND RIGHT FLANK;  Surgeon: Aviva Signs, MD;  Location: [facility];  Service: General;  Laterality: Right;    Home Medications:  Allergies as of 09/18/2020      Reactions   Codeine Hives      Medication List       Accurate as of September 18, 2020  9:24 AM. If you have any questions, ask your nurse or doctor.        aspirin 81 MG EC tablet Take 1 tablet (81 mg total) by mouth daily with breakfast.   atorvastatin 40 MG tablet Commonly known as: LIPITOR Take 1 tablet (40 mg total) by mouth daily.   gabapentin 300 MG capsule Commonly known as: NEURONTIN Take 300 mg by mouth 2 (two) times daily as needed.   metoprolol tartrate 25 MG tablet Commonly known as: LOPRESSOR Take 0.5 tablets (12.5 mg total) by mouth 2 (two) times daily.   nitroGLYCERIN 0.4 MG SL tablet Commonly known as: NITROSTAT Place 1 tablet (0.4 mg total) under the tongue every 5 (five) minutes x 3 doses as needed for chest pain.   oxyCODONE-acetaminophen 5-325 MG tablet Commonly known as: PERCOCET/ROXICET Take 1 tablet by mouth 4 (four) times daily as needed.       Allergies:  Allergies  Allergen Reactions  . Codeine Hives    Family History: Family History  Problem Relation Age of Onset  . Heart attack Brother     Social  History:  reports that he quit smoking about 16 years ago. His smoking use included cigars. He quit after 20.00 years of use. He has never used smokeless tobacco. He reports that he does not drink alcohol and does not use drugs.  ROS: All other review of systems were reviewed and are negative except what is noted above in HPI  Physical Exam: BP (!) 185/80   Pulse (!) 59   Temp 97.6 F (36.4 C) (Oral)   Ht 6' (1.829 m)   Wt 195 lb (88.5 kg)   BMI 26.45 kg/m   Constitutional:  Alert and oriented, No acute distress. HEENT: [place] AT, moist mucus membranes.  Trachea midline, no masses. Cardiovascular: No clubbing, cyanosis, or edema. Respiratory: Normal respiratory effort, no  increased work of breathing. GI: Abdomen is soft, nontender, nondistended, no abdominal masses GU: No CVA tenderness.  Lymph: No cervical or inguinal lymphadenopathy. Skin: No rashes, bruises or suspicious lesions. Neurologic: Grossly intact, no focal deficits, moving all 4 extremities. Psychiatric: Normal mood and affect.  Laboratory Data: Lab Results  Component Value Date   WBC 4.7 07/07/2019   HGB 11.9 (L) 07/07/2019   HCT 37.2 (L) 07/07/2019   MCV 98.4 07/07/2019   PLT 128 (L) 07/07/2019    Lab Results  Component Value Date   CREATININE 1.57 (H) 07/07/2019    No results found for: PSA  No results found for: TESTOSTERONE  Lab Results  Component Value Date   HGBA1C 6.1 (H) 08/14/2014    Urinalysis No results found for: COLORURINE, APPEARANCEUR, LABSPEC, PHURINE, GLUCOSEU, HGBUR, BILIRUBINUR, KETONESUR, PROTEINUR, UROBILINOGEN, NITRITE, LEUKOCYTESUR  No results found for: LABMICR, [place], RBCUA, LABEPIT, MUCUS, BACTERIA  Pertinent Imaging: CT 3/28 and KUB today: Inages reviewed and discussed with the patient Results for orders placed in visit on 02/22/01  DG Abd 1 View  Narrative FINDINGS CLINICAL DATA:  KIDNEY STONES. KUB: COMPARISON: 10/05/96. THERE ARE SMALL BILATERAL RENAL CALCULI IN THE 3-4 MM SIZE RANGE.  THERE IS A LARGE CALCIFICATION PROJECTING OVER THE RIGHT L5-S1 LEVEL OF THE SPINE, MEASURING APPROXIMATELY 10 X 15 MM.  THIS WAS NOT PRESENT PREVIOUSLY AND IS LIKELY A LARGE RIGHT LOWER URETERAL CALCULUS. IMPRESSION 1.  SMALL BILATERAL RENAL CALCULI. 2.  10 X 15 MM CALCIFICATION ON THE RIGHT THAT COULD BE A LARGE URETERAL CALCULUS.  No results found for this or any previous visit.  No results found for this or any previous visit.  No results found for this or any previous visit.  No results found for this or any previous visit.  No results found for this or any previous visit.  No results found for this or any previous visit.  No results found  for this or any previous visit.   Assessment & Plan:    1. Nephrolithiasis -We discussed the management of kidney stones. These options include observation, ureteroscopy, shockwave lithotripsy (ESWL) and percutaneous nephrolithotomy (PCNL). We discussed which options are relevant to the patient's stone(s). We discussed the natural history of kidney stones as well as the complications of untreated stones and the impact on quality of life without treatment as well as with each of the above listed treatments. We also discussed the efficacy of each treatment in its ability to clear the stone burden. With any of these management options I discussed the signs and symptoms of infection and the need for emergent treatment should these be experienced. For each option we discussed the ability of each procedure to clear the patient of their stone  burden.   For observation I described the risks which include but are not limited to silent renal damage, life-threatening infection, need for emergent surgery, failure to pass stone and pain.   For ureteroscopy I described the risks which include bleeding, infection, damage to contiguous structures, positioning injury, ureteral stricture, ureteral avulsion, ureteral injury, need for prolonged ureteral stent, inability to perform ureteroscopy, need for an interval procedure, inability to clear stone burden, stent discomfort/pain, heart attack, stroke, pulmonary embolus and the inherent risks with general anesthesia.   For shockwave lithotripsy I described the risks which include arrhythmia, kidney contusion, kidney hemorrhage, need for transfusion, pain, inability to adequately break up stone, inability to pass stone fragments, Steinstrasse, infection associated with obstructing stones, need for alternate surgical procedure, need for repeat shockwave lithotripsy, MI, CVA, PE and the inherent risks with anesthesia/conscious sedation.   For PCNL I described the risks  including positioning injury, pneumothorax, hydrothorax, need for chest tube, inability to clear stone burden, renal laceration, arterial venous fistula or malformation, need for embolization of kidney, loss of kidney or renal function, need for repeat procedure, need for prolonged nephrostomy tube, ureteral avulsion, MI, CVA, PE and the inherent risks of general anesthesia.   - The patient would like to proceed with Right ESWL   No follow-ups on file.  Nicolette Bang, MD  [facility] [place]

## 2020-09-18 NOTE — H&P (View-Only) (Signed)
09/18/2020 9:24 AM   Charles Brooks [birth year] [id number]  Referring provider: [name], MD [place],  [place] [address]  nephrolithiasis  HPI: [name] is a 70yo here for evaluation for nephrolithiasis. He was previously seen by Dr. Terance Hart 20 years ago. His last stone event was 1999. He started having right flank pain Monday and presented to his PCP and was sent to the ER. He underwent CT which showed a 55mm right distal ureteral calculus. No flank pain currently. Nausea has resolved also.    PMH: Past Medical History:  Diagnosis Date  . Arthritis   . CAD (coronary artery disease), native coronary artery    DES RCA and PTCA RVM 07/2014, LVEF 55%  . CKD (chronic kidney disease) stage 3, GFR 30-59 ml/min (HCC)   . Essential hypertension   . Hyperlipidemia   . STEMI (ST elevation myocardial infarction) ([place])    07/2014    Surgical History: Past Surgical History:  Procedure Laterality Date  . CHOLECYSTECTOMY    . COLONOSCOPY WITH PROPOFOL N/A 11/08/2013   Procedure: COLONOSCOPY WITH PROPOFOL;  Surgeon: [name], MD;  Location: [facility];  Service: General;  Laterality: N/A;  in cecum at 0741; cecal withdrawal time = 10 min  . EXTRACORPOREAL SHOCK WAVE LITHOTRIPSY    . INCISION AND DRAINAGE ABSCESS Right 08/12/2015   Procedure: INCISION AND DRAINAGE ABSCESS;  Surgeon: Aviva Signs, MD;  [facility]: [facility];  Service: General;  Laterality: Right;  . KNEE ARTHROSCOPY Right   . LEFT HEART CATHETERIZATION WITH CORONARY ANGIOGRAM N/A 08/14/2014   Procedure: LEFT HEART CATHETERIZATION WITH CORONARY ANGIOGRAM;  Surgeon: Troy Sine, MD;  LAD 50-60%, CFX OK, OM1 80%(small), RCA 100>>0% w/ 2.25x24 mm Synergy DES, EF 55%  . POLYPECTOMY N/A 11/08/2013   Procedure: POLYPECTOMY;  Surgeon: [name], MD;  Location: [facility];  Service: General;  Laterality: N/A;  cecal polyp  . Removal of kidney stones     Open  . WOUND DEBRIDEMENT Right 10/26/2015   Procedure: DEBRIDEMENT  WOUND RIGHT FLANK;  Surgeon: Aviva Signs, MD;  Location: [facility];  Service: General;  Laterality: Right;    Home Medications:  Allergies as of 09/18/2020      Reactions   Codeine Hives      Medication List       Accurate as of September 18, 2020  9:24 AM. If you have any questions, ask your nurse or doctor.        aspirin 81 MG EC tablet Take 1 tablet (81 mg total) by mouth daily with breakfast.   atorvastatin 40 MG tablet Commonly known as: LIPITOR Take 1 tablet (40 mg total) by mouth daily.   gabapentin 300 MG capsule Commonly known as: NEURONTIN Take 300 mg by mouth 2 (two) times daily as needed.   metoprolol tartrate 25 MG tablet Commonly known as: LOPRESSOR Take 0.5 tablets (12.5 mg total) by mouth 2 (two) times daily.   nitroGLYCERIN 0.4 MG SL tablet Commonly known as: NITROSTAT Place 1 tablet (0.4 mg total) under the tongue every 5 (five) minutes x 3 doses as needed for chest pain.   oxyCODONE-acetaminophen 5-325 MG tablet Commonly known as: PERCOCET/ROXICET Take 1 tablet by mouth 4 (four) times daily as needed.       Allergies:  Allergies  Allergen Reactions  . Codeine Hives    Family History: Family History  Problem Relation Age of Onset  . Heart attack Brother     Social  History:  reports that he quit smoking about 16 years ago. His smoking use included cigars. He quit after 20.00 years of use. He has never used smokeless tobacco. He reports that he does not drink alcohol and does not use drugs.  ROS: All other review of systems were reviewed and are negative except what is noted above in HPI  Physical Exam: BP (!) 185/80   Pulse (!) 59   Temp 97.6 F (36.4 C) (Oral)   Ht 6' (1.829 m)   Wt 195 lb (88.5 kg)   BMI 26.45 kg/m   Constitutional:  Alert and oriented, No acute distress. HEENT: [place] AT, moist mucus membranes.  Trachea midline, no masses. Cardiovascular: No clubbing, cyanosis, or edema. Respiratory: Normal respiratory effort, no  increased work of breathing. GI: Abdomen is soft, nontender, nondistended, no abdominal masses GU: No CVA tenderness.  Lymph: No cervical or inguinal lymphadenopathy. Skin: No rashes, bruises or suspicious lesions. Neurologic: Grossly intact, no focal deficits, moving all 4 extremities. Psychiatric: Normal mood and affect.  Laboratory Data: Lab Results  Component Value Date   WBC 4.7 07/07/2019   HGB 11.9 (L) 07/07/2019   HCT 37.2 (L) 07/07/2019   MCV 98.4 07/07/2019   PLT 128 (L) 07/07/2019    Lab Results  Component Value Date   CREATININE 1.57 (H) 07/07/2019    No results found for: PSA  No results found for: TESTOSTERONE  Lab Results  Component Value Date   HGBA1C 6.1 (H) 08/14/2014    Urinalysis No results found for: COLORURINE, APPEARANCEUR, LABSPEC, PHURINE, GLUCOSEU, HGBUR, BILIRUBINUR, KETONESUR, PROTEINUR, UROBILINOGEN, NITRITE, LEUKOCYTESUR  No results found for: LABMICR, [place], RBCUA, LABEPIT, MUCUS, BACTERIA  Pertinent Imaging: CT 3/28 and KUB today: Inages reviewed and discussed with the patient Results for orders placed in visit on 02/22/01  DG Abd 1 View  Narrative FINDINGS CLINICAL DATA:  KIDNEY STONES. KUB: COMPARISON: 10/05/96. THERE ARE SMALL BILATERAL RENAL CALCULI IN THE 3-4 MM SIZE RANGE.  THERE IS A LARGE CALCIFICATION PROJECTING OVER THE RIGHT L5-S1 LEVEL OF THE SPINE, MEASURING APPROXIMATELY 10 X 15 MM.  THIS WAS NOT PRESENT PREVIOUSLY AND IS LIKELY A LARGE RIGHT LOWER URETERAL CALCULUS. IMPRESSION 1.  SMALL BILATERAL RENAL CALCULI. 2.  10 X 15 MM CALCIFICATION ON THE RIGHT THAT COULD BE A LARGE URETERAL CALCULUS.  No results found for this or any previous visit.  No results found for this or any previous visit.  No results found for this or any previous visit.  No results found for this or any previous visit.  No results found for this or any previous visit.  No results found for this or any previous visit.  No results found  for this or any previous visit.   Assessment & Plan:    1. Nephrolithiasis -We discussed the management of kidney stones. These options include observation, ureteroscopy, shockwave lithotripsy (ESWL) and percutaneous nephrolithotomy (PCNL). We discussed which options are relevant to the patient's stone(s). We discussed the natural history of kidney stones as well as the complications of untreated stones and the impact on quality of life without treatment as well as with each of the above listed treatments. We also discussed the efficacy of each treatment in its ability to clear the stone burden. With any of these management options I discussed the signs and symptoms of infection and the need for emergent treatment should these be experienced. For each option we discussed the ability of each procedure to clear the patient of their stone  burden.   For observation I described the risks which include but are not limited to silent renal damage, life-threatening infection, need for emergent surgery, failure to pass stone and pain.   For ureteroscopy I described the risks which include bleeding, infection, damage to contiguous structures, positioning injury, ureteral stricture, ureteral avulsion, ureteral injury, need for prolonged ureteral stent, inability to perform ureteroscopy, need for an interval procedure, inability to clear stone burden, stent discomfort/pain, heart attack, stroke, pulmonary embolus and the inherent risks with general anesthesia.   For shockwave lithotripsy I described the risks which include arrhythmia, kidney contusion, kidney hemorrhage, need for transfusion, pain, inability to adequately break up stone, inability to pass stone fragments, Steinstrasse, infection associated with obstructing stones, need for alternate surgical procedure, need for repeat shockwave lithotripsy, MI, CVA, PE and the inherent risks with anesthesia/conscious sedation.   For PCNL I described the risks  including positioning injury, pneumothorax, hydrothorax, need for chest tube, inability to clear stone burden, renal laceration, arterial venous fistula or malformation, need for embolization of kidney, loss of kidney or renal function, need for repeat procedure, need for prolonged nephrostomy tube, ureteral avulsion, MI, CVA, PE and the inherent risks of general anesthesia.   - The patient would like to proceed with Right ESWL   No follow-ups on file.  Nicolette Bang, MD  [facility] [place]

## 2020-09-21 ENCOUNTER — Other Ambulatory Visit: Payer: Self-pay

## 2020-09-21 ENCOUNTER — Encounter (HOSPITAL_COMMUNITY): Payer: Self-pay

## 2020-09-21 ENCOUNTER — Other Ambulatory Visit (HOSPITAL_COMMUNITY)
Admission: RE | Admit: 2020-09-21 | Discharge: 2020-09-21 | Disposition: A | Discharge status: Home / Self Care | Payer: PPO | Source: Ambulatory Visit | Attending: Urology | Admitting: Urology

## 2020-09-21 ENCOUNTER — Encounter (HOSPITAL_COMMUNITY)
Admission: RE | Admit: 2020-09-21 | Discharge: 2020-09-21 | Disposition: A | Discharge status: Home / Self Care | Payer: PPO | Source: Ambulatory Visit | Attending: Urology | Admitting: Urology

## 2020-09-21 DIAGNOSIS — Z20822 Contact with and (suspected) exposure to covid-19: Secondary | ICD-10-CM | POA: Diagnosis not present

## 2020-09-21 DIAGNOSIS — E7800 Pure hypercholesterolemia, unspecified: Secondary | ICD-10-CM | POA: Diagnosis not present

## 2020-09-21 DIAGNOSIS — N200 Calculus of kidney: Secondary | ICD-10-CM

## 2020-09-21 DIAGNOSIS — Z01812 Encounter for preprocedural laboratory examination: Secondary | ICD-10-CM | POA: Insufficient documentation

## 2020-09-21 DIAGNOSIS — I10 Essential (primary) hypertension: Secondary | ICD-10-CM | POA: Diagnosis not present

## 2020-09-21 DIAGNOSIS — Z299 Encounter for prophylactic measures, unspecified: Secondary | ICD-10-CM | POA: Diagnosis not present

## 2020-09-21 HISTORY — DX: Personal history of urinary calculi: Z87.442

## 2020-09-22 ENCOUNTER — Encounter: Payer: Self-pay | Admitting: Urology

## 2020-09-22 ENCOUNTER — Ambulatory Visit (HOSPITAL_COMMUNITY)
Admission: RE | Admit: 2020-09-22 | Discharge: 2020-09-22 | Disposition: A | Discharge status: Home / Self Care | Payer: PPO | Source: Ambulatory Visit | Attending: Urology | Admitting: Urology

## 2020-09-22 ENCOUNTER — Encounter (HOSPITAL_COMMUNITY)
Admission: RE | Disposition: A | Discharge status: Home / Self Care | Payer: Self-pay | Source: Ambulatory Visit | Attending: Urology

## 2020-09-22 ENCOUNTER — Encounter (HOSPITAL_COMMUNITY): Payer: Self-pay | Admitting: Urology

## 2020-09-22 DIAGNOSIS — Z87891 Personal history of nicotine dependence: Secondary | ICD-10-CM | POA: Diagnosis not present

## 2020-09-22 DIAGNOSIS — I2510 Atherosclerotic heart disease of native coronary artery without angina pectoris: Secondary | ICD-10-CM | POA: Diagnosis not present

## 2020-09-22 DIAGNOSIS — E785 Hyperlipidemia, unspecified: Secondary | ICD-10-CM | POA: Insufficient documentation

## 2020-09-22 DIAGNOSIS — N201 Calculus of ureter: Secondary | ICD-10-CM

## 2020-09-22 DIAGNOSIS — N200 Calculus of kidney: Secondary | ICD-10-CM

## 2020-09-22 DIAGNOSIS — M47816 Spondylosis without myelopathy or radiculopathy, lumbar region: Secondary | ICD-10-CM | POA: Diagnosis not present

## 2020-09-22 DIAGNOSIS — M160 Bilateral primary osteoarthritis of hip: Secondary | ICD-10-CM | POA: Diagnosis not present

## 2020-09-22 DIAGNOSIS — Z9049 Acquired absence of other specified parts of digestive tract: Secondary | ICD-10-CM | POA: Diagnosis not present

## 2020-09-22 DIAGNOSIS — I252 Old myocardial infarction: Secondary | ICD-10-CM | POA: Diagnosis not present

## 2020-09-22 DIAGNOSIS — Z79899 Other long term (current) drug therapy: Secondary | ICD-10-CM | POA: Diagnosis not present

## 2020-09-22 DIAGNOSIS — Z8249 Family history of ischemic heart disease and other diseases of the circulatory system: Secondary | ICD-10-CM | POA: Insufficient documentation

## 2020-09-22 DIAGNOSIS — Z885 Allergy status to narcotic agent status: Secondary | ICD-10-CM | POA: Diagnosis not present

## 2020-09-22 DIAGNOSIS — N183 Chronic kidney disease, stage 3 unspecified: Secondary | ICD-10-CM | POA: Diagnosis not present

## 2020-09-22 DIAGNOSIS — I129 Hypertensive chronic kidney disease with stage 1 through stage 4 chronic kidney disease, or unspecified chronic kidney disease: Secondary | ICD-10-CM | POA: Diagnosis not present

## 2020-09-22 DIAGNOSIS — K5790 Diverticulosis of intestine, part unspecified, without perforation or abscess without bleeding: Secondary | ICD-10-CM | POA: Diagnosis not present

## 2020-09-22 HISTORY — PX: EXTRACORPOREAL SHOCK WAVE LITHOTRIPSY: SHX1557

## 2020-09-22 LAB — SARS CORONAVIRUS 2 (TAT 6-24 HRS): SARS Coronavirus 2: NEGATIVE

## 2020-09-22 SURGERY — LITHOTRIPSY, ESWL
Anesthesia: LOCAL | Laterality: Right

## 2020-09-22 MED ORDER — ONDANSETRON HCL 4 MG PO TABS: 4.0000 mg | ORAL_TABLET | Freq: Every day | ORAL | 1 refills | Status: DC | PRN

## 2020-09-22 MED ORDER — OXYCODONE-ACETAMINOPHEN 5-325 MG PO TABS: 1.0000 | ORAL_TABLET | ORAL | 0 refills | Status: DC | PRN

## 2020-09-22 MED ORDER — DIAZEPAM 5 MG PO TABS
10.0000 mg | ORAL_TABLET | Freq: Once | ORAL | Status: AC
  Administered 2020-09-22: 10 mg via ORAL
  Filled 2020-09-22: qty 2

## 2020-09-22 MED ORDER — TAMSULOSIN HCL 0.4 MG PO CAPS: 0.4000 mg | ORAL_CAPSULE | Freq: Every day | ORAL | 0 refills | Status: DC

## 2020-09-22 MED ORDER — DIPHENHYDRAMINE HCL 25 MG PO CAPS
25.0000 mg | ORAL_CAPSULE | ORAL | Status: AC
  Administered 2020-09-22: 25 mg via ORAL
  Filled 2020-09-22: qty 1

## 2020-09-22 MED ORDER — SODIUM CHLORIDE 0.9 % IV SOLN
Freq: Once | INTRAVENOUS | Status: AC
  Administered 2020-09-22: 1000 mL via INTRAVENOUS

## 2020-09-22 NOTE — Progress Notes (Signed)
Post ESL reddened site along right lower groin area.  No open blisters at the site.

## 2020-09-22 NOTE — Interval H&P Note (Signed)
History and Physical Interval Note:  09/22/2020 10:51 AM  Charles Brooks  has presented today for surgery, with the diagnosis of right ureteral calculus.  The various methods of treatment have been discussed with the patient and family. After consideration of risks, benefits and other options for treatment, the patient has consented to  Procedure(s) with comments: EXTRACORPOREAL SHOCK WAVE LITHOTRIPSY (ESWL) (Right) - cases not starting until 10:00 due to last minute add ons, need time to get covid tests back as a surgical intervention.  The patient's history has been reviewed, patient examined, no change in status, stable for surgery.  I have reviewed the patient's chart and labs.  Questions were answered to the patient's satisfaction.     Nicolette Bang

## 2020-09-22 NOTE — Discharge Instructions (Signed)

## 2020-09-22 NOTE — Patient Instructions (Signed)
[facility] (25th ed., pp. [phone]). [place], [place]: Tye [place], [employer]. Retrieved from https://www.clinicalkey.com/#!/content/book/3-[email or web address]?[email or web address]">  Lithotripsy  Lithotripsy is a treatment that can help break up kidney stones that are too large to pass on their own. This is a nonsurgical procedure that crushes a kidney stone with shock waves. These shock waves pass through your body and focus on the kidney stone. They cause the kidney stone to break up into smaller pieces while it is still in the urinary tract. The smaller pieces of stone can pass more easily out of your body in the urine. Tell a health care provider about:  Any allergies you have.  All medicines you are taking, including vitamins, herbs, eye drops, creams, and over-the-counter medicines.  Any problems you or family members have had with anesthetic medicines.  Any blood disorders you have.  Any surgeries you have had.  Any medical conditions you have.  Whether you are pregnant or may be pregnant. What are the risks? Generally, this is a safe procedure. However, problems may occur, including:  Infection.  Bleeding from the kidney.  Bruising of the kidney or skin.  Scarring of the kidney, which can lead to: ? Increased blood pressure. ? Poor kidney function. ? Return (recurrence) of kidney stones.  Damage to other structures or organs, such as the liver, colon, spleen, or pancreas.  Blockage (obstruction) of the tube that carries urine from the kidney to the bladder (ureter).  Failure of the kidney stone to break into pieces (fragments). What happens before the procedure? Staying hydrated Follow instructions from your health care provider about hydration, which may include:  Up to 2 hours before the procedure - you may continue to drink clear liquids, such as water, clear fruit juice, black coffee, and plain tea. Eating and drinking restrictions Follow  instructions from your health care provider about eating and drinking, which may include:  8 hours before the procedure - stop eating heavy meals or foods, such as meat, fried foods, or fatty foods.  6 hours before the procedure - stop eating light meals or foods, such as toast or cereal.  6 hours before the procedure - stop drinking milk or drinks that contain milk.  2 hours before the procedure - stop drinking clear liquids. Medicines Ask your health care provider about:  Changing or stopping your regular medicines. This is especially important if you are taking diabetes medicines or blood thinners.  Taking medicines such as aspirin and ibuprofen. These medicines can thin your blood. Do not take these medicines unless your health care provider tells you to take them.  Taking over-the-counter medicines, vitamins, herbs, and supplements. Tests You may have tests, such as:  Blood tests.  Urine tests.  Imaging tests, such as a CT scan. General instructions  Plan to have someone take you home from the hospital or clinic.  If you will be going home right after the procedure, plan to have someone with you for 24 hours.  Ask your health care provider what steps will be taken to help prevent infection. These may include washing skin with a germ-killing soap. What happens during the procedure?  An IV will be inserted into one of your veins.  You will be given one or more of the following: ? A medicine to help you relax (sedative). ? A medicine to make you fall asleep (general anesthetic).  A water-filled cushion may be placed behind your kidney or on your abdomen. In some cases, you may be placed in a tub of  lukewarm water.  Your body will be positioned in a way that makes it easy to target the kidney stone.  An X-ray or ultrasound exam will be done to locate your stone.  Shock waves will be aimed at the stone. If you are awake, you may feel a tapping sensation as the shock  waves pass through your body.  A flexible tube with holes in it (stent) may be placed in the ureter. This will help keep urine flowing from the kidney if the fragments of the stone have been blocking the ureter. The procedure may vary among health care providers and hospitals.   What happens after the procedure?  You may have an X-ray to see whether the procedure was able to break up the kidney stone and how much of the stone has passed. If large stone fragments remain after treatment, you may need to have a second procedure at a later time.  Your blood pressure, heart rate, breathing rate, and blood oxygen level will be monitored until you leave the hospital or clinic.  You may be given antibiotics or pain medicine as needed.  If a stent was placed in your ureter during surgery, it may stay in place for a few weeks.  You may need to strain your urine to collect pieces of the kidney stone for testing.  You will need to drink plenty of water.  If you were given a sedative during the procedure, it can affect you for several hours. Do not drive or operate machinery until your health care provider says that it is safe. Summary  Lithotripsy is a treatment that can help break up kidney stones that are too large to pass on their own.  Lithotripsy is a nonsurgical procedure that crushes a kidney stone with shock waves.  Generally, this is a safe procedure. However, problems may occur, including damage to the kidney or other organs, infection, or obstruction of the tube that carries urine from the kidney to the bladder (ureter).  You may have a stent placed in your ureter to help drain your urine. This stent may stay in place for a few weeks.  After the procedure, you will need to drink plenty of water. You may be asked to strain your urine to collect pieces of the kidney stone for testing. This information is not intended to replace advice given to you by your health care provider. Make sure  you discuss any questions you have with your health care provider. Document Revised: 03/20/2019 Document Reviewed: 03/20/2019 Elsevier Patient Education  [place].

## 2020-09-25 ENCOUNTER — Encounter (HOSPITAL_COMMUNITY): Payer: Self-pay | Admitting: Urology

## 2020-10-12 ENCOUNTER — Ambulatory Visit: Payer: PPO | Admitting: Urology

## 2020-10-17 DIAGNOSIS — I10 Essential (primary) hypertension: Secondary | ICD-10-CM | POA: Diagnosis not present

## 2020-10-17 DIAGNOSIS — M159 Polyosteoarthritis, unspecified: Secondary | ICD-10-CM | POA: Diagnosis not present

## 2020-10-17 DIAGNOSIS — E7800 Pure hypercholesterolemia, unspecified: Secondary | ICD-10-CM | POA: Diagnosis not present

## 2020-11-16 DIAGNOSIS — E7800 Pure hypercholesterolemia, unspecified: Secondary | ICD-10-CM | POA: Diagnosis not present

## 2020-11-16 DIAGNOSIS — I10 Essential (primary) hypertension: Secondary | ICD-10-CM | POA: Diagnosis not present

## 2020-11-16 DIAGNOSIS — M159 Polyosteoarthritis, unspecified: Secondary | ICD-10-CM | POA: Diagnosis not present

## 2020-12-17 ENCOUNTER — Encounter: Payer: Self-pay | Admitting: Cardiology

## 2020-12-17 DIAGNOSIS — Z7189 Other specified counseling: Secondary | ICD-10-CM | POA: Diagnosis not present

## 2020-12-17 DIAGNOSIS — Z6825 Body mass index (BMI) 25.0-25.9, adult: Secondary | ICD-10-CM | POA: Diagnosis not present

## 2020-12-17 DIAGNOSIS — Z79899 Other long term (current) drug therapy: Secondary | ICD-10-CM | POA: Diagnosis not present

## 2020-12-17 DIAGNOSIS — Z1331 Encounter for screening for depression: Secondary | ICD-10-CM | POA: Diagnosis not present

## 2020-12-17 DIAGNOSIS — R5383 Other fatigue: Secondary | ICD-10-CM | POA: Diagnosis not present

## 2020-12-17 DIAGNOSIS — E7800 Pure hypercholesterolemia, unspecified: Secondary | ICD-10-CM | POA: Diagnosis not present

## 2020-12-17 DIAGNOSIS — Z0000 Encounter for general adult medical examination without abnormal findings: Secondary | ICD-10-CM | POA: Diagnosis not present

## 2020-12-17 DIAGNOSIS — Z1339 Encounter for screening examination for other mental health and behavioral disorders: Secondary | ICD-10-CM | POA: Diagnosis not present

## 2020-12-17 DIAGNOSIS — Z87891 Personal history of nicotine dependence: Secondary | ICD-10-CM | POA: Diagnosis not present

## 2020-12-17 DIAGNOSIS — Z125 Encounter for screening for malignant neoplasm of prostate: Secondary | ICD-10-CM | POA: Diagnosis not present

## 2020-12-17 DIAGNOSIS — I10 Essential (primary) hypertension: Secondary | ICD-10-CM | POA: Diagnosis not present

## 2020-12-17 DIAGNOSIS — Z299 Encounter for prophylactic measures, unspecified: Secondary | ICD-10-CM | POA: Diagnosis not present

## 2021-01-14 ENCOUNTER — Ambulatory Visit: Payer: PPO | Admitting: General Surgery

## 2021-01-14 ENCOUNTER — Encounter: Payer: Self-pay | Admitting: General Surgery

## 2021-01-14 ENCOUNTER — Other Ambulatory Visit: Payer: Self-pay

## 2021-01-14 VITALS — BP 142/75 | HR 53 | Temp 98.6°F | Resp 12 | Ht 72.0 in | Wt 185.0 lb

## 2021-01-14 DIAGNOSIS — Z86010 Personal history of colonic polyps: Secondary | ICD-10-CM | POA: Diagnosis not present

## 2021-01-14 DIAGNOSIS — Z1211 Encounter for screening for malignant neoplasm of colon: Secondary | ICD-10-CM

## 2021-01-14 MED ORDER — SUTAB 1479-225-188 MG PO TABS: 1.0000 | ORAL_TABLET | Freq: Once | ORAL | 0 refills | Status: AC

## 2021-01-14 NOTE — H&P (Signed)
Charles Brooks; [id number]; 01-15-1951   HPI Patient is a 70 year old white male who was referred to my care by [name] for a follow-up colonoscopy.  Patient last had a colonoscopy in 2015.  A tubular adenoma was found in the cecum.  Patient otherwise has done well.  He has no recent abdominal pain, weight loss, abnormal diarrhea or constipation, or blood in his stools. Past Medical History:  Diagnosis Date   Arthritis    CAD (coronary artery disease), native coronary artery    DES RCA and PTCA RVM 07/2014, LVEF 55%   CKD (chronic kidney disease) stage 3, GFR 30-59 ml/min (HCC)    Essential hypertension    History of kidney stones    Hyperlipidemia    STEMI (ST elevation myocardial infarction) ([place])    07/2014    Past Surgical History:  Procedure Laterality Date   CHOLECYSTECTOMY     COLONOSCOPY WITH PROPOFOL N/A 11/08/2013   Procedure: COLONOSCOPY WITH PROPOFOL;  Surgeon: [name], MD;  Location: [facility];  Service: General;  Laterality: N/A;  in cecum at 0741; cecal withdrawal time = 10 min   EXTRACORPOREAL SHOCK WAVE LITHOTRIPSY     EXTRACORPOREAL SHOCK WAVE LITHOTRIPSY Right 09/22/2020   Procedure: EXTRACORPOREAL SHOCK WAVE LITHOTRIPSY (ESWL);  Surgeon: Cleon Gustin, MD;  Location: [facility];  Service: Urology;  Laterality: Right;  cases not starting until 10:00 due to last minute add ons, need time to get covid tests back   INCISION AND DRAINAGE ABSCESS Right 08/12/2015   Procedure: INCISION AND DRAINAGE ABSCESS;  Surgeon: Aviva Signs, MD;  Location: [facility];  Service: General;  Laterality: Right;   KNEE ARTHROSCOPY Right    LEFT HEART CATHETERIZATION WITH CORONARY ANGIOGRAM N/A 08/14/2014   Procedure: LEFT HEART CATHETERIZATION WITH CORONARY ANGIOGRAM;  Surgeon: Troy Sine, MD;  LAD 50-60%, CFX OK, OM1 80%(small), RCA 100>>0% w/ 2.25x24 mm Synergy DES, EF 55%   POLYPECTOMY N/A 11/08/2013   Procedure: POLYPECTOMY;  Surgeon: [name], MD;  Location: [facility];  Service:  General;  Laterality: N/A;  cecal polyp   Removal of kidney stones     Open   WOUND DEBRIDEMENT Right 10/26/2015   Procedure: DEBRIDEMENT WOUND RIGHT FLANK;  Surgeon: Aviva Signs, MD;  Location: [facility];  Service: General;  Laterality: Right;    Family History  Problem Relation Age of Onset   Heart attack Brother     Current Outpatient Medications on File Prior to Visit  Medication Sig Dispense Refill   aspirin 81 MG EC tablet Take 1 tablet (81 mg total) by mouth daily with breakfast. 30 tablet 1   atorvastatin (LIPITOR) 40 MG tablet Take 1 tablet (40 mg total) by mouth daily. 10 tablet 0   metoprolol tartrate (LOPRESSOR) 25 MG tablet Take 0.5 tablets (12.5 mg total) by mouth 2 (two) times daily. (Patient taking differently: Take 25 mg by mouth 2 (two) times daily.) 45 tablet 11   nitroGLYCERIN (NITROSTAT) 0.4 MG SL tablet Place 1 tablet (0.4 mg total) under the tongue every 5 (five) minutes x 3 doses as needed for chest pain. 25 tablet 12   ondansetron (ZOFRAN) 4 MG tablet Take 1 tablet (4 mg total) by mouth daily as needed for nausea or vomiting. 30 tablet 1   oxyCODONE-acetaminophen (PERCOCET) 5-325 MG tablet Take 1 tablet by mouth every 4 (four) hours as needed for severe pain. 30 tablet 0   tamsulosin (FLOMAX) 0.4 MG CAPS capsule Take 1 capsule (0.4 mg  total) by mouth daily after supper. 30 capsule 0   vitamin B-12 (CYANOCOBALAMIN) 1000 MCG tablet Take 1,000 mcg by mouth daily.     zinc gluconate 50 MG tablet Take 50 mg by mouth daily.     No current facility-administered medications on file prior to visit.    Allergies  Allergen Reactions   Codeine Hives    Social History   Substance and Sexual Activity  Alcohol Use No   Alcohol/week: 0.0 standard drinks    Social History   Tobacco Use  Smoking Status Former   Types: Cigars   Quit date: 11/05/2003   Years since quitting: 17.2  Smokeless Tobacco Never    Review of Systems  Constitutional: Negative.   HENT:  Negative.    Eyes: Negative.   Respiratory: Negative.    Cardiovascular: Negative.   Gastrointestinal: Negative.   Genitourinary:  Positive for frequency.  Musculoskeletal:  Positive for back pain and joint pain.  Skin: Negative.   Neurological:  Positive for sensory change.  Endo/Heme/Allergies: Negative.   Psychiatric/Behavioral: Negative.     Objective   Vitals:   01/14/21 0926  BP: (!) 142/75  Pulse: (!) 53  Resp: 12  Temp: 98.6 F (37 C)  SpO2: 96%    Physical Exam Vitals reviewed.  Constitutional:      Appearance: Normal appearance. He is normal weight. He is not ill-appearing.  HENT:     Head: Normocephalic and atraumatic.  Cardiovascular:     Rate and Rhythm: Normal rate and regular rhythm.     Heart sounds: Normal heart sounds. No murmur heard.   No friction rub. No gallop.  Pulmonary:     Effort: Pulmonary effort is normal. No respiratory distress.     Breath sounds: Normal breath sounds. No stridor. No wheezing, rhonchi or rales.  Abdominal:     General: Bowel sounds are normal. There is no distension.     Palpations: Abdomen is soft. There is no mass.     Tenderness: There is no abdominal tenderness. There is no guarding or rebound.     Hernia: No hernia is present.  Skin:    General: Skin is warm and dry.  Neurological:     Mental Status: He is alert and oriented to person, place, and time.    Assessment  Need for screening colonoscopy, history of colon polyp Plan  Patient is scheduled for a colonoscopy on 02/09/2021.  The risks and benefits of the procedure including bleeding and perforation were fully explained to the patient, who gave informed consent.  Sutabs have been prescribed.

## 2021-01-14 NOTE — Progress Notes (Signed)
Charles Brooks; [id number]; 1951/03/19   HPI Patient is a 70 year old white male who was referred to my care by [name] for a follow-up colonoscopy.  Patient last had a colonoscopy in 2015.  A tubular adenoma was found in the cecum.  Patient otherwise has Brooks well.  He has no recent abdominal pain, weight loss, abnormal diarrhea or constipation, or blood in his stools. Past Medical History:  Diagnosis Date   Arthritis    CAD (coronary artery disease), native coronary artery    DES RCA and PTCA RVM 07/2014, LVEF 55%   CKD (chronic kidney disease) stage 3, GFR 30-59 ml/min (HCC)    Essential hypertension    History of kidney stones    Hyperlipidemia    STEMI (ST elevation myocardial infarction) ([place])    07/2014    Past Surgical History:  Procedure Laterality Date   CHOLECYSTECTOMY     COLONOSCOPY WITH PROPOFOL N/A 11/08/2013   Procedure: COLONOSCOPY WITH PROPOFOL;  Surgeon: [name], MD;  Location: [facility];  Service: General;  Laterality: N/A;  in cecum at 0741; cecal withdrawal time = 10 min   EXTRACORPOREAL SHOCK WAVE LITHOTRIPSY     EXTRACORPOREAL SHOCK WAVE LITHOTRIPSY Right 09/22/2020   Procedure: EXTRACORPOREAL SHOCK WAVE LITHOTRIPSY (ESWL);  Surgeon: Cleon Gustin, MD;  Location: [facility];  Service: Urology;  Laterality: Right;  cases not starting until 10:00 due to last minute add ons, need time to get covid tests back   INCISION AND DRAINAGE ABSCESS Right 08/12/2015   Procedure: INCISION AND DRAINAGE ABSCESS;  Surgeon: Aviva Signs, MD;  Location: [facility];  Service: General;  Laterality: Right;   KNEE ARTHROSCOPY Right    LEFT HEART CATHETERIZATION WITH CORONARY ANGIOGRAM N/A 08/14/2014   Procedure: LEFT HEART CATHETERIZATION WITH CORONARY ANGIOGRAM;  Surgeon: Troy Sine, MD;  LAD 50-60%, CFX OK, OM1 80%(small), RCA 100>>0% w/ 2.25x24 mm Synergy DES, EF 55%   POLYPECTOMY N/A 11/08/2013   Procedure: POLYPECTOMY;  Surgeon: [name], MD;  Location: [facility];  Service:  General;  Laterality: N/A;  cecal polyp   Removal of kidney stones     Open   WOUND DEBRIDEMENT Right 10/26/2015   Procedure: DEBRIDEMENT WOUND RIGHT FLANK;  Surgeon: Aviva Signs, MD;  Location: [facility];  Service: General;  Laterality: Right;    Family History  Problem Relation Age of Onset   Heart attack Brother     Current Outpatient Medications on File Prior to Visit  Medication Sig Dispense Refill   aspirin 81 MG EC tablet Take 1 tablet (81 mg total) by mouth daily with breakfast. 30 tablet 1   atorvastatin (LIPITOR) 40 MG tablet Take 1 tablet (40 mg total) by mouth daily. 10 tablet 0   metoprolol tartrate (LOPRESSOR) 25 MG tablet Take 0.5 tablets (12.5 mg total) by mouth 2 (two) times daily. (Patient taking differently: Take 25 mg by mouth 2 (two) times daily.) 45 tablet 11   nitroGLYCERIN (NITROSTAT) 0.4 MG SL tablet Place 1 tablet (0.4 mg total) under the tongue every 5 (five) minutes x 3 doses as needed for chest pain. 25 tablet 12   ondansetron (ZOFRAN) 4 MG tablet Take 1 tablet (4 mg total) by mouth daily as needed for nausea or vomiting. 30 tablet 1   oxyCODONE-acetaminophen (PERCOCET) 5-325 MG tablet Take 1 tablet by mouth every 4 (four) hours as needed for severe pain. 30 tablet 0   tamsulosin (FLOMAX) 0.4 MG CAPS capsule Take 1 capsule (0.4 mg  total) by mouth daily after supper. 30 capsule 0   vitamin B-12 (CYANOCOBALAMIN) 1000 MCG tablet Take 1,000 mcg by mouth daily.     zinc gluconate 50 MG tablet Take 50 mg by mouth daily.     No current facility-administered medications on file prior to visit.    Allergies  Allergen Reactions   Codeine Hives    Social History   Substance and Sexual Activity  Alcohol Use No   Alcohol/week: 0.0 standard drinks    Social History   Tobacco Use  Smoking Status Former   Types: Cigars   Quit date: 11/05/2003   Years since quitting: 17.2  Smokeless Tobacco Never    Review of Systems  Constitutional: Negative.   HENT:  Negative.    Eyes: Negative.   Respiratory: Negative.    Cardiovascular: Negative.   Gastrointestinal: Negative.   Genitourinary:  Positive for frequency.  Musculoskeletal:  Positive for back pain and joint pain.  Skin: Negative.   Neurological:  Positive for sensory change.  Endo/Heme/Allergies: Negative.   Psychiatric/Behavioral: Negative.     Objective   Vitals:   01/14/21 0926  BP: (!) 142/75  Pulse: (!) 53  Resp: 12  Temp: 98.6 F (37 C)  SpO2: 96%    Physical Exam Vitals reviewed.  Constitutional:      Appearance: Normal appearance. He is normal weight. He is not ill-appearing.  HENT:     Head: Normocephalic and atraumatic.  Cardiovascular:     Rate and Rhythm: Normal rate and regular rhythm.     Heart sounds: Normal heart sounds. No murmur heard.   No friction rub. No gallop.  Pulmonary:     Effort: Pulmonary effort is normal. No respiratory distress.     Breath sounds: Normal breath sounds. No stridor. No wheezing, rhonchi or rales.  Abdominal:     General: Bowel sounds are normal. There is no distension.     Palpations: Abdomen is soft. There is no mass.     Tenderness: There is no abdominal tenderness. There is no guarding or rebound.     Hernia: No hernia is present.  Skin:    General: Skin is warm and dry.  Neurological:     Mental Status: He is alert and oriented to person, place, and time.    Assessment  Need for screening colonoscopy, history of colon polyp Plan  Patient is scheduled for a colonoscopy on 02/09/2021.  The risks and benefits of the procedure including bleeding and perforation were fully explained to the patient, who gave informed consent.  Sutabs have been prescribed.

## 2021-02-09 ENCOUNTER — Encounter (HOSPITAL_COMMUNITY): Payer: Self-pay | Admitting: General Surgery

## 2021-02-09 ENCOUNTER — Ambulatory Visit (HOSPITAL_COMMUNITY)
Admission: RE | Admit: 2021-02-09 | Discharge: 2021-02-09 | Disposition: A | Discharge status: Home / Self Care | Payer: PPO | Source: Ambulatory Visit | Attending: General Surgery | Admitting: General Surgery

## 2021-02-09 ENCOUNTER — Encounter (HOSPITAL_COMMUNITY)
Admission: RE | Disposition: A | Discharge status: Home / Self Care | Payer: Self-pay | Source: Ambulatory Visit | Attending: General Surgery

## 2021-02-09 ENCOUNTER — Other Ambulatory Visit: Payer: Self-pay

## 2021-02-09 DIAGNOSIS — I129 Hypertensive chronic kidney disease with stage 1 through stage 4 chronic kidney disease, or unspecified chronic kidney disease: Secondary | ICD-10-CM | POA: Diagnosis not present

## 2021-02-09 DIAGNOSIS — Z885 Allergy status to narcotic agent status: Secondary | ICD-10-CM | POA: Insufficient documentation

## 2021-02-09 DIAGNOSIS — I252 Old myocardial infarction: Secondary | ICD-10-CM | POA: Insufficient documentation

## 2021-02-09 DIAGNOSIS — Z860100 Personal history of colon polyps, unspecified: Secondary | ICD-10-CM

## 2021-02-09 DIAGNOSIS — K5730 Diverticulosis of large intestine without perforation or abscess without bleeding: Secondary | ICD-10-CM | POA: Diagnosis not present

## 2021-02-09 DIAGNOSIS — K5790 Diverticulosis of intestine, part unspecified, without perforation or abscess without bleeding: Secondary | ICD-10-CM

## 2021-02-09 DIAGNOSIS — D120 Benign neoplasm of cecum: Secondary | ICD-10-CM | POA: Diagnosis not present

## 2021-02-09 DIAGNOSIS — E785 Hyperlipidemia, unspecified: Secondary | ICD-10-CM | POA: Insufficient documentation

## 2021-02-09 DIAGNOSIS — Z87891 Personal history of nicotine dependence: Secondary | ICD-10-CM | POA: Insufficient documentation

## 2021-02-09 DIAGNOSIS — N183 Chronic kidney disease, stage 3 unspecified: Secondary | ICD-10-CM | POA: Insufficient documentation

## 2021-02-09 DIAGNOSIS — Z1211 Encounter for screening for malignant neoplasm of colon: Secondary | ICD-10-CM | POA: Insufficient documentation

## 2021-02-09 DIAGNOSIS — D122 Benign neoplasm of ascending colon: Secondary | ICD-10-CM | POA: Diagnosis not present

## 2021-02-09 DIAGNOSIS — Z7982 Long term (current) use of aspirin: Secondary | ICD-10-CM | POA: Insufficient documentation

## 2021-02-09 DIAGNOSIS — Z79899 Other long term (current) drug therapy: Secondary | ICD-10-CM | POA: Diagnosis not present

## 2021-02-09 DIAGNOSIS — Z9049 Acquired absence of other specified parts of digestive tract: Secondary | ICD-10-CM | POA: Diagnosis not present

## 2021-02-09 DIAGNOSIS — I2510 Atherosclerotic heart disease of native coronary artery without angina pectoris: Secondary | ICD-10-CM | POA: Insufficient documentation

## 2021-02-09 DIAGNOSIS — Z8249 Family history of ischemic heart disease and other diseases of the circulatory system: Secondary | ICD-10-CM | POA: Insufficient documentation

## 2021-02-09 HISTORY — PX: COLONOSCOPY: SHX5424

## 2021-02-09 HISTORY — PX: POLYPECTOMY: SHX149

## 2021-02-09 SURGERY — COLONOSCOPY
Anesthesia: Moderate Sedation

## 2021-02-09 MED ORDER — MEPERIDINE HCL 50 MG/ML IJ SOLN
INTRAMUSCULAR | Status: DC | PRN
  Administered 2021-02-09: 50 mg via INTRAVENOUS

## 2021-02-09 MED ORDER — SODIUM CHLORIDE 0.9 % IV SOLN: INTRAVENOUS | Status: DC

## 2021-02-09 MED ORDER — MIDAZOLAM HCL 5 MG/5ML IJ SOLN
INTRAMUSCULAR | Status: DC | PRN
  Administered 2021-02-09 (×2): 1 mg via INTRAVENOUS
  Administered 2021-02-09: 2 mg via INTRAVENOUS
  Administered 2021-02-09: 1 mg via INTRAVENOUS

## 2021-02-09 MED ORDER — MIDAZOLAM HCL 5 MG/5ML IJ SOLN
INTRAMUSCULAR | Status: AC
  Filled 2021-02-09: qty 10

## 2021-02-09 MED ORDER — MEPERIDINE HCL 50 MG/ML IJ SOLN
INTRAMUSCULAR | Status: AC
  Filled 2021-02-09: qty 1

## 2021-02-09 NOTE — Interval H&P Note (Signed)
History and Physical Interval Note:  02/09/2021 7:08 AM  Charles Brooks  has presented today for surgery, with the diagnosis of Screening Z12.11.  The various methods of treatment have been discussed with the patient and family. After consideration of risks, benefits and other options for treatment, the patient has consented to  Procedure(s): COLONOSCOPY (N/A) as a surgical intervention.  The patient's history has been reviewed, patient examined, no change in status, stable for surgery.  I have reviewed the patient's chart and labs.  Questions were answered to the patient's satisfaction.     Aviva Signs

## 2021-02-09 NOTE — Op Note (Signed)
[facility] Patient Name: Charles Brooks Procedure Date: 02/09/2021 7:05 AM MRN: [id number] Date of Birth: May 01, 1951 Attending MD: Aviva Signs , MD CSN: [id number] Age: 70 Admit Type: Outpatient Procedure:                Colonoscopy Indications:              Screening for colorectal malignant neoplasm, High                            risk colon cancer surveillance: Personal history of                            colonic polyps Providers:                Aviva Signs, MD, Crystal Page, Aram Candela Referring MD:              Medicines:                Midazolam 5 mg IV, Meperidine 50 mg IV Complications:            No immediate complications. Estimated Blood Loss:     Estimated blood loss: none. Procedure:                Pre-Anesthesia Assessment:                           - Prior to the procedure, a History and Physical                            was performed, and patient medications and                            allergies were reviewed. The patient is competent.                            The risks and benefits of the procedure and the                            sedation options and risks were discussed with the                            patient. All questions were answered and informed                            consent was obtained. Patient identification and                            proposed procedure were verified by the physician,                            the nurse and the technician in the endoscopy                            suite. Mental Status Examination: alert and  oriented. Airway Examination: normal oropharyngeal                            airway and neck mobility. Respiratory Examination:                            clear to auscultation. CV Examination: RRR, no                            murmurs, no S3 or S4. Prophylactic Antibiotics: The                            patient does not require prophylactic antibiotics.                             Prior Anticoagulants: The patient has taken no                            previous anticoagulant or antiplatelet agents                            except for aspirin. ASA Grade Assessment: III - A                            patient with severe systemic disease. After                            reviewing the risks and benefits, the patient was                            deemed in satisfactory condition to undergo the                            procedure. The anesthesia plan was to use moderate                            sedation / analgesia (conscious sedation).                            Immediately prior to administration of medications,                            the patient was re-assessed for adequacy to receive                            sedatives. The heart rate, respiratory rate, oxygen                            saturations, blood pressure, adequacy of pulmonary                            ventilation, and response to care were monitored  throughout the procedure. The physical status of                            the patient was re-assessed after the procedure.                           After obtaining informed consent, the colonoscope                            was passed under direct vision. Throughout the                            procedure, the patient's blood pressure, pulse, and                            oxygen saturations were monitored continuously. The                            [id number]) scope was introduced through the                            anus and advanced to the the cecum, identified by                            the appendiceal orifice, ileocecal valve and                            palpation. No anatomical landmarks were                            photographed. The entire colon was well visualized.                            The colonoscopy was somewhat difficult due to a                            tortuous colon. The  quality of the bowel                            preparation was adequate. The total duration of the                            procedure was 19 minutes. Scope In: 7:23:07 AM Scope Out: 7:42:36 AM Scope Withdrawal Time: 0 hours 3 minutes 29 seconds  Total Procedure Duration: 0 hours 19 minutes 29 seconds  Findings:      Scattered medium-mouthed diverticula were found in the descending colon.       Estimated blood loss: none.      The perianal and digital rectal examinations were normal.      A 3 mm polyp was found in the cecum. The polyp was semi-pedunculated.       The polyp was removed with a hot snare. Resection and retrieval were       complete. Estimated blood loss: none.      The exam was otherwise  without abnormality. Impression:               - Diverticulosis in the descending colon.                           - One 3 mm polyp in the cecum, removed with a hot                            snare. Resected and retrieved.                           - The examination was otherwise normal. Moderate Sedation:      Moderate (conscious) sedation was administered by the endoscopy nurse       and supervised by the endoscopist. The following parameters were       monitored: oxygen saturation, heart rate, blood pressure, and response       to care. Recommendation:           - Written discharge instructions were provided to                            the patient.                           - The signs and symptoms of potential delayed                            complications were discussed with the patient.                           - Patient has a contact number available for                            emergencies.                           - Return to normal activities tomorrow.                           - Resume previous diet.                           - Continue present medications.                           - Repeat colonoscopy is recommended. The                            colonoscopy  date will be determined after pathology                            results from today's exam become available for                            review. Procedure Code(s):        --- Professional ---  45385, Colonoscopy, flexible; with removal of                            tumor(s), polyp(s), or other lesion(s) by snare                            technique Diagnosis Code(s):        --- Professional ---                           Z12.11, Encounter for screening for malignant                            neoplasm of colon                           Z86.010, Personal history of colonic polyps                           K63.5, Polyp of colon                           K57.30, Diverticulosis of large intestine without                            perforation or abscess without bleeding CPT copyright 2019 American Medical Association. All rights reserved. The codes documented in this report are preliminary and upon coder review may  be revised to meet current compliance requirements. Aviva Signs, MD Aviva Signs, MD 02/09/2021 7:52:11 AM This report has been signed electronically. Number of Addenda: 0

## 2021-02-10 LAB — SURGICAL PATHOLOGY

## 2021-02-18 ENCOUNTER — Encounter (HOSPITAL_COMMUNITY): Payer: Self-pay | Admitting: General Surgery

## 2021-02-25 NOTE — Progress Notes (Signed)
Cardiology Office Note  Date: 02/26/2021   ID: Abdirizak, Risenhoover 05/12/1951, MRN [id number]  PCP:  [name], MD  Cardiologist:  Rozann Lesches, MD Electrophysiologist:  None   Chief Complaint  Patient presents with   Cardiac follow-up    History of Present Illness: Charles Brooks is a 70 y.o. male last seen in March.  He is here for a routine visit.  Still doing very well, no angina symptoms or nitroglycerin use.  He remains active with chores and yard work, no progressive limitation, NYHA class II dyspnea.  I reviewed his medications which are noted below.  We discussed getting a fresh refill for nitroglycerin.  Also requesting interval lab work from PCP.  He has had no intolerances on Lipitor.  He tends to run a low heart rate, not symptomatic, and we have maintained low-dose Lopressor.  Past Medical History:  Diagnosis Date   Arthritis    CAD (coronary artery disease), native coronary artery    DES RCA and PTCA RVM 07/2014, LVEF 55%   CKD (chronic kidney disease) stage 3, GFR 30-59 ml/min (HCC)    Essential hypertension    History of kidney stones    Hyperlipidemia    STEMI (ST elevation myocardial infarction) ([place])    07/2014    Past Surgical History:  Procedure Laterality Date   CHOLECYSTECTOMY     COLONOSCOPY N/A 02/09/2021   Procedure: COLONOSCOPY;  Surgeon: Aviva Signs, MD;  Location: [facility];  Service: Gastroenterology;  Laterality: N/A;   COLONOSCOPY WITH PROPOFOL N/A 11/08/2013   Procedure: COLONOSCOPY WITH PROPOFOL;  Surgeon: [name], MD;  Location: [facility] ORS;  Service: General;  Laterality: N/A;  in cecum at 0741; cecal withdrawal time = 10 min   EXTRACORPOREAL SHOCK WAVE LITHOTRIPSY     EXTRACORPOREAL SHOCK WAVE LITHOTRIPSY Right 09/22/2020   Procedure: EXTRACORPOREAL SHOCK WAVE LITHOTRIPSY (ESWL);  Surgeon: Cleon Gustin, MD;  Location: [facility] ORS;  Service: Urology;  Laterality: Right;  cases not starting until 10:00 due to last minute add  ons, need time to get covid tests back   INCISION AND DRAINAGE ABSCESS Right 08/12/2015   Procedure: INCISION AND DRAINAGE ABSCESS;  Surgeon: Aviva Signs, MD;  Location: [facility] OR[facility];  Service: General;  Laterality: Right;   KNEE ARTHROSCOPY Right    LEFT HEART CATHETERIZATION WITH CORONARY ANGIOGRAM N/A 08/14/2014   Procedure: LEFT HEART CATHETERIZATION WITH CORONARY ANGIOGRAM;  Surgeon: Troy Sine, MD;  LAD 50-60%, CFX OK, OM1 80%(small), RCA 100>>0% w/ 2.25x24 mm Synergy DES, EF 55%   POLYPECTOMY N/A 11/08/2013   Procedure: POLYPECTOMY;  Surgeon: [name], MD;  Location: [facility] OR[facility];  Service: General;  Laterality: N/A;  cecal polyp   POLYPECTOMY  02/09/2021   Procedure: POLYPECTOMY INTESTINAL;  Surgeon: Aviva Signs, MD;  Location: [facility];  Service: Gastroenterology;;   Removal of kidney stones     Open   WOUND DEBRIDEMENT Right 10/26/2015   Procedure: DEBRIDEMENT WOUND RIGHT FLANK;  Surgeon: Aviva Signs, MD;  Location: [facility] OR[facility];  Service: General;  Laterality: Right;    Current Outpatient Medications  Medication Sig Dispense Refill   aspirin 81 MG EC tablet Take 1 tablet (81 mg total) by mouth daily with breakfast. 30 tablet 1   atorvastatin (LIPITOR) 40 MG tablet Take 1 tablet (40 mg total) by mouth daily. 10 tablet 0   metoprolol tartrate (LOPRESSOR) 25 MG tablet Take 0.5 tablets (12.5 mg total) by mouth 2 (two) times  daily. (Patient taking differently: Take 25 mg by mouth 2 (two) times daily.) 45 tablet 11   vitamin B-12 (CYANOCOBALAMIN) 1000 MCG tablet Take 1,000 mcg by mouth daily.     zinc gluconate 50 MG tablet Take 50 mg by mouth daily.     nitroGLYCERIN (NITROSTAT) 0.4 MG SL tablet Place 1 tablet (0.4 mg total) under the tongue every 5 (five) minutes x 3 doses as needed for chest pain. 25 tablet 12   ondansetron (ZOFRAN) 4 MG tablet Take 1 tablet (4 mg total) by mouth daily as needed for nausea or vomiting. (Patient not taking: Reported on 02/26/2021) 30 tablet 1   tamsulosin  (FLOMAX) 0.4 MG CAPS capsule Take 1 capsule (0.4 mg total) by mouth daily after supper. (Patient not taking: No sig reported) 30 capsule 0   No current facility-administered medications for this visit.   Allergies:  Codeine   ROS:  No syncope.  Physical Exam: VS:  BP 130/62   Pulse (!) 51   Ht 6' (1.829 m)   Wt 183 lb 12.8 oz (83.4 kg)   SpO2 91%   BMI 24.93 kg/m , BMI Body mass index is 24.93 kg/m.  Wt Readings from Last 3 Encounters:  02/26/21 183 lb 12.8 oz (83.4 kg)  02/09/21 185 lb (83.9 kg)  01/14/21 185 lb (83.9 kg)    General: Patient appears comfortable at rest. HEENT: Conjunctiva and lids normal, wearing a mask. Neck: Supple, no elevated JVP or carotid bruits, no thyromegaly. Lungs: Clear to auscultation, nonlabored breathing at rest. Cardiac: Regular rate and rhythm, no S3 or significant systolic murmur. Extremities: No pitting edema.  ECG:  An ECG dated 08/20/2020 was personally reviewed today and demonstrated:  Sinus bradycardia with nonspecific ST-T changes.  Recent Labwork:  March 2022: Hemoglobin 14.3, platelets 180, BUN 17, creatinine 1.66, potassium 4.0, AST 26, ALT 30  Other Studies Reviewed Today:  No interval cardiac testing for review today.  Assessment and Plan:  1.  CAD status post DES to the RCA and angioplasty of the RV marginal in 2016.  We have continued observation on medical therapy in the absence of angina symptoms or change in stamina.  We have discussed the possibility of follow-up ischemic testing however if symptoms intervene.  Refill provided for fresh bottle of nitroglycerin.  Continue aspirin, Lopressor, and Lipitor.  2.  Mixed hyperlipidemia, on Lipitor.  Requesting interval lab work from [name].  3.  Hypertension, systolic [id number] today.  No changes in current medications.  Medication Adjustments/Labs and Tests Ordered: Current medicines are reviewed at length with the patient today.  Concerns regarding medicines are outlined above.    Tests Ordered: No orders of the defined types were placed in this encounter.   Medication Changes: Meds ordered this encounter  Medications   nitroGLYCERIN (NITROSTAT) 0.4 MG SL tablet    Sig: Place 1 tablet (0.4 mg total) under the tongue every 5 (five) minutes x 3 doses as needed for chest pain.    Dispense:  25 tablet    Refill:  12     Disposition:  Follow up  6 months.  Signed, Satira Sark, MD, [facility] 02/26/2021 9:58 AM    [facility] [place], [place], [place] [address] Phone: [phone]; Fax: [phone]

## 2021-02-26 ENCOUNTER — Ambulatory Visit: Payer: PPO | Admitting: Cardiology

## 2021-02-26 ENCOUNTER — Encounter: Payer: Self-pay | Admitting: Cardiology

## 2021-02-26 VITALS — BP 130/62 | HR 51 | Ht 72.0 in | Wt 183.8 lb

## 2021-02-26 DIAGNOSIS — I25119 Atherosclerotic heart disease of native coronary artery with unspecified angina pectoris: Secondary | ICD-10-CM | POA: Diagnosis not present

## 2021-02-26 DIAGNOSIS — E782 Mixed hyperlipidemia: Secondary | ICD-10-CM | POA: Diagnosis not present

## 2021-02-26 DIAGNOSIS — I10 Essential (primary) hypertension: Secondary | ICD-10-CM | POA: Diagnosis not present

## 2021-02-26 MED ORDER — NITROGLYCERIN 0.4 MG SL SUBL: 0.4000 mg | SUBLINGUAL_TABLET | SUBLINGUAL | 12 refills | Status: AC | PRN

## 2021-02-26 NOTE — Patient Instructions (Signed)
Medication Instructions:  Your physician recommends that you continue on your current medications as directed. Please refer to the Current Medication list given to you today.  *If you need a refill on your cardiac medications before your next appointment, please call your pharmacy*   Lab Work: We have requested your most recent lab work from your primary care provider If you have labs (blood work) drawn today and your tests are completely normal, you will receive your results only by: [place] (if you have MyChart) OR A paper copy in the mail If you have any lab test that is abnormal or we need to change your treatment, we will call you to review the results.   Testing/Procedures: None   Follow-Up: At [facility], you and your health needs are our priority.  As part of our continuing mission to provide you with exceptional heart care, we have created designated Provider Care Teams.  These Care Teams include your primary Cardiologist (physician) and Advanced Practice Providers (APPs -  Physician Assistants and Nurse Practitioners) who all work together to provide you with the care you need, when you need it.  We recommend signing up for the patient portal called "MyChart".  Sign up information is provided on this After Visit Summary.  MyChart is used to connect with patients for Virtual Visits (Telemedicine).  Patients are able to view lab/test results, encounter notes, upcoming appointments, etc.  Non-urgent messages can be sent to your provider as well.   To learn more about what you can do with MyChart, go to [email or web address].    Your next appointment:   6 month(s)  The format for your next appointment:   In Person  Provider:   Rozann Lesches, MD   Other Instructions

## 2021-03-03 DIAGNOSIS — Z23 Encounter for immunization: Secondary | ICD-10-CM | POA: Diagnosis not present

## 2021-03-19 DIAGNOSIS — I10 Essential (primary) hypertension: Secondary | ICD-10-CM | POA: Diagnosis not present

## 2021-03-19 DIAGNOSIS — E7800 Pure hypercholesterolemia, unspecified: Secondary | ICD-10-CM | POA: Diagnosis not present

## 2021-04-19 DIAGNOSIS — I700 Atherosclerosis of aorta: Secondary | ICD-10-CM | POA: Diagnosis not present

## 2021-04-19 DIAGNOSIS — Z299 Encounter for prophylactic measures, unspecified: Secondary | ICD-10-CM | POA: Diagnosis not present

## 2021-04-19 DIAGNOSIS — E7800 Pure hypercholesterolemia, unspecified: Secondary | ICD-10-CM | POA: Diagnosis not present

## 2021-04-19 DIAGNOSIS — L97521 Non-pressure chronic ulcer of other part of left foot limited to breakdown of skin: Secondary | ICD-10-CM | POA: Diagnosis not present

## 2021-04-19 DIAGNOSIS — R600 Localized edema: Secondary | ICD-10-CM | POA: Diagnosis not present

## 2021-04-19 DIAGNOSIS — M159 Polyosteoarthritis, unspecified: Secondary | ICD-10-CM | POA: Diagnosis not present

## 2021-04-19 DIAGNOSIS — I10 Essential (primary) hypertension: Secondary | ICD-10-CM | POA: Diagnosis not present

## 2021-04-26 DIAGNOSIS — M869 Osteomyelitis, unspecified: Secondary | ICD-10-CM | POA: Diagnosis not present

## 2021-04-26 DIAGNOSIS — M86172 Other acute osteomyelitis, left ankle and foot: Secondary | ICD-10-CM | POA: Diagnosis not present

## 2021-04-26 DIAGNOSIS — L97524 Non-pressure chronic ulcer of other part of left foot with necrosis of bone: Secondary | ICD-10-CM | POA: Diagnosis not present

## 2021-04-26 DIAGNOSIS — I739 Peripheral vascular disease, unspecified: Secondary | ICD-10-CM | POA: Diagnosis not present

## 2021-04-28 DIAGNOSIS — L97521 Non-pressure chronic ulcer of other part of left foot limited to breakdown of skin: Secondary | ICD-10-CM | POA: Diagnosis not present

## 2021-04-28 DIAGNOSIS — Z299 Encounter for prophylactic measures, unspecified: Secondary | ICD-10-CM | POA: Diagnosis not present

## 2021-04-28 DIAGNOSIS — I700 Atherosclerosis of aorta: Secondary | ICD-10-CM | POA: Diagnosis not present

## 2021-04-28 DIAGNOSIS — I10 Essential (primary) hypertension: Secondary | ICD-10-CM | POA: Diagnosis not present

## 2021-04-28 DIAGNOSIS — N1832 Chronic kidney disease, stage 3b: Secondary | ICD-10-CM | POA: Diagnosis not present

## 2021-05-03 DIAGNOSIS — I700 Atherosclerosis of aorta: Secondary | ICD-10-CM | POA: Diagnosis not present

## 2021-05-03 DIAGNOSIS — I743 Embolism and thrombosis of arteries of the lower extremities: Secondary | ICD-10-CM | POA: Diagnosis not present

## 2021-05-03 DIAGNOSIS — L97524 Non-pressure chronic ulcer of other part of left foot with necrosis of bone: Secondary | ICD-10-CM | POA: Diagnosis not present

## 2021-05-03 DIAGNOSIS — I739 Peripheral vascular disease, unspecified: Secondary | ICD-10-CM | POA: Diagnosis not present

## 2021-05-03 DIAGNOSIS — I340 Nonrheumatic mitral (valve) insufficiency: Secondary | ICD-10-CM | POA: Diagnosis not present

## 2021-05-03 DIAGNOSIS — M869 Osteomyelitis, unspecified: Secondary | ICD-10-CM | POA: Diagnosis not present

## 2021-05-06 DIAGNOSIS — N1832 Chronic kidney disease, stage 3b: Secondary | ICD-10-CM | POA: Diagnosis not present

## 2021-05-06 DIAGNOSIS — Z299 Encounter for prophylactic measures, unspecified: Secondary | ICD-10-CM | POA: Diagnosis not present

## 2021-05-06 DIAGNOSIS — I10 Essential (primary) hypertension: Secondary | ICD-10-CM | POA: Diagnosis not present

## 2021-05-06 DIAGNOSIS — I739 Peripheral vascular disease, unspecified: Secondary | ICD-10-CM | POA: Diagnosis not present

## 2021-05-06 DIAGNOSIS — I700 Atherosclerosis of aorta: Secondary | ICD-10-CM | POA: Diagnosis not present

## 2021-05-24 DIAGNOSIS — L97524 Non-pressure chronic ulcer of other part of left foot with necrosis of bone: Secondary | ICD-10-CM | POA: Diagnosis not present

## 2021-05-24 DIAGNOSIS — I739 Peripheral vascular disease, unspecified: Secondary | ICD-10-CM | POA: Diagnosis not present

## 2021-05-24 DIAGNOSIS — M869 Osteomyelitis, unspecified: Secondary | ICD-10-CM | POA: Diagnosis not present

## 2021-06-07 DIAGNOSIS — I739 Peripheral vascular disease, unspecified: Secondary | ICD-10-CM | POA: Diagnosis not present

## 2021-06-07 DIAGNOSIS — L97524 Non-pressure chronic ulcer of other part of left foot with necrosis of bone: Secondary | ICD-10-CM | POA: Diagnosis not present

## 2021-06-07 DIAGNOSIS — M869 Osteomyelitis, unspecified: Secondary | ICD-10-CM | POA: Diagnosis not present

## 2021-06-09 ENCOUNTER — Encounter: Payer: Self-pay | Admitting: Vascular Surgery

## 2021-06-09 ENCOUNTER — Ambulatory Visit (INDEPENDENT_AMBULATORY_CARE_PROVIDER_SITE_OTHER): Payer: PPO

## 2021-06-09 ENCOUNTER — Ambulatory Visit: Payer: PPO | Admitting: Vascular Surgery

## 2021-06-09 ENCOUNTER — Other Ambulatory Visit: Payer: Self-pay | Admitting: Vascular Surgery

## 2021-06-09 ENCOUNTER — Other Ambulatory Visit: Payer: Self-pay

## 2021-06-09 VITALS — BP 166/88 | HR 55 | Temp 98.4°F | Wt 182.8 lb

## 2021-06-09 DIAGNOSIS — I739 Peripheral vascular disease, unspecified: Secondary | ICD-10-CM

## 2021-06-09 NOTE — Progress Notes (Signed)
[facility] [place]  Patient name: Charles Brooks MRN: [id number] DOB: 28-Nov-1950 Sex: male  REASON FOR CONSULT: Evaluation lower extremity arterial insufficiency with slow healing of left great toe ulcer  HPI: Charles Brooks is a 70 y.o. male, who is here today for evaluation of lower extremity arterial flow.  He is a very pleasant active gentleman who reports rubbing a blister on the tip of his left great toe at the end of the summer.  He was weed eating and wearing rubber boots and noted a blister present.  This eventually dried and on peeling the blister remnants off of his toe, he noticed an open ulceration.  This has been slow to heal.  He has been treated appropriately with Dr. Posey Pronto with antibiotics and debridement and has had near healing of this.  He denies any pain associated with this.  He does not have any arterial rest pain and does not have any history of claudication.  He does have a history of coronary artery disease status post coronary angioplasty in 2016.  He has a remote history of cigarette smoking.  He does not have a history of diabetes.  He does have chronic kidney disease stage III.  He does have peripheral neuropathy.  Past Medical History:  Diagnosis Date   Arthritis    CAD (coronary artery disease), native coronary artery    DES RCA and PTCA RVM 07/2014, LVEF 55%   CKD (chronic kidney disease) stage 3, GFR 30-59 ml/min (HCC)    Essential hypertension    History of kidney stones    Hyperlipidemia    STEMI (ST elevation myocardial infarction) ([place])    07/2014    Family History  Problem Relation Age of Onset   Heart attack Brother     SOCIAL HISTORY: Social History   Socioeconomic History   Marital status: Married    Spouse name: Not on file   Number of children: Not on file   Years of education: Not on file   Highest education level: Not on file  Occupational History   Not on file  Tobacco Use    Smoking status: Former    Types: Cigars    Quit date: 11/05/2003    Years since quitting: 17.6   Smokeless tobacco: Never  Vaping Use   Vaping Use: Never used  Substance and Sexual Activity   Alcohol use: No    Alcohol/week: 0.0 standard drinks   Drug use: No   Sexual activity: Yes    Birth control/protection: None  Other Topics Concern   Not on file  Social History Narrative   Not on file   Social Determinants of Health   Financial Resource Strain: Not on file  Food Insecurity: Not on file  Transportation Needs: Not on file  Physical Activity: Not on file  Stress: Not on file  Social Connections: Not on file  Intimate Partner Violence: Not on file    Allergies  Allergen Reactions   Codeine Hives    Current Outpatient Medications  Medication Sig Dispense Refill   aspirin 81 MG EC tablet Take 1 tablet (81 mg total) by mouth daily with breakfast. 30 tablet 1   atorvastatin (LIPITOR) 40 MG tablet Take 1 tablet (40 mg total) by mouth daily. 10 tablet 0   metoprolol tartrate (LOPRESSOR) 25 MG tablet Take 0.5 tablets (12.5 mg total) by mouth 2 (two) times daily. (Patient taking differently: Take 25 mg by mouth 2 (two) times daily.)  45 tablet 11   nitroGLYCERIN (NITROSTAT) 0.4 MG SL tablet Place 1 tablet (0.4 mg total) under the tongue every 5 (five) minutes x 3 doses as needed for chest pain. 25 tablet 12   ondansetron (ZOFRAN) 4 MG tablet Take 1 tablet (4 mg total) by mouth daily as needed for nausea or vomiting. (Patient not taking: Reported on 02/26/2021) 30 tablet 1   tamsulosin (FLOMAX) 0.4 MG CAPS capsule Take 1 capsule (0.4 mg total) by mouth daily after supper. (Patient not taking: No sig reported) 30 capsule 0   vitamin B-12 (CYANOCOBALAMIN) 1000 MCG tablet Take 1,000 mcg by mouth daily.     zinc gluconate 50 MG tablet Take 50 mg by mouth daily.     No current facility-administered medications for this visit.    REVIEW OF SYSTEMS:  [X]  denotes positive finding, [ ]   denotes negative finding Cardiac  Comments:  Chest pain or chest pressure:    Shortness of breath upon exertion:    Short of breath when lying flat:    Irregular heart rhythm:        Vascular    Pain in calf, thigh, or hip brought on by ambulation: x   Pain in feet at night that wakes you up from your sleep:  x   Blood clot in your veins:    Leg swelling:  x       Pulmonary    Oxygen at home:    Productive cough:     Wheezing:         Neurologic    Sudden weakness in arms or legs:     Sudden numbness in arms or legs:     Sudden onset of difficulty speaking or slurred speech:    Temporary loss of vision in one eye:     Problems with dizziness:         Gastrointestinal    Blood in stool:     Vomited blood:         Genitourinary    Burning when urinating:     Blood in urine:        Psychiatric    Major depression:         Hematologic    Bleeding problems:    Problems with blood clotting too easily:        Skin    Rashes or ulcers: x       Constitutional    Fever or chills:      PHYSICAL EXAM: Vitals:   06/09/21 1342  BP: (!) 166/88  Pulse: (!) 55  Temp: 98.4 F (36.9 C)  TempSrc: Temporal  SpO2: 99%  Weight: 182 lb 12.8 oz (82.9 kg)    GENERAL: The patient is a well-nourished male, in no acute distress. The vital signs are documented above. CARDIOVASCULAR: 2+ radial pulses bilaterally.  2+ femoral pulses bilaterally.  2+ left popliteal pulse.  Absent right popliteal pulse.  Absent pedal pulses bilaterally. PULMONARY: There is good air exchange  MUSCULOSKELETAL: There are no major deformities or cyanosis. NEUROLOGIC: No focal weakness or paresthesias are detected. SKIN: He does have a 5 mm superficial ulceration on the tip of his left great toe with no surrounding erythema and no fluctuance PSYCHIATRIC: The patient has a normal affect.  DATA:  I reviewed his noninvasive studies from [facility] from 05/03/2021.  This is a bilateral lower  extremity duplex suggesting right popliteal and tibial occlusive disease and left leg tibial disease.  Noninvasive studies in  our office today reveal ankle arm index of 0.66 on the right and 0.92 on the left.  He has monophasic tibial waveforms on the left which may suggest that this ankle arm index is falsely elevated  MEDICAL ISSUES: Moderate bilateral arterial insufficiency.  He has had very slow healing of the ulceration on his great toe but it does appear to be healing.  I discussed progression of his ulceration and if this occurs he is to notify his medical providers or [place] immediately.  I feel that he will have adequate flow to heal this.  I did explain the option of arteriography for further evaluation should he have progression of his ulceration.  He was reassured with this discussion will see [place] again on an as-needed basis   Rosetta Posner, MD [facility] [phone] Pager [phone]  Note: Portions of this report may have been transcribed using voice recognition software.  Every effort has been made to ensure accuracy; however, inadvertent computerized transcription errors may still be present.

## 2021-06-24 ENCOUNTER — Ambulatory Visit: Payer: PPO | Admitting: Urology

## 2021-07-12 DIAGNOSIS — L97524 Non-pressure chronic ulcer of other part of left foot with necrosis of bone: Secondary | ICD-10-CM | POA: Diagnosis not present

## 2021-07-12 DIAGNOSIS — M869 Osteomyelitis, unspecified: Secondary | ICD-10-CM | POA: Diagnosis not present

## 2021-07-12 DIAGNOSIS — I739 Peripheral vascular disease, unspecified: Secondary | ICD-10-CM | POA: Diagnosis not present

## 2021-09-01 NOTE — Progress Notes (Signed)
? ? ?Cardiology Office Note ? ?Date: 09/02/2021  ? ?ID: Charles Brooks, DOB 1951/03/06, MRN [id number] ? ?PCP:  [name], MD  ?Cardiologist:  Rozann Lesches, MD ?Electrophysiologist:  None  ? ?Chief Complaint  ?Patient presents with  ? Cardiac follow-up  ? ? ?History of Present Illness: ?Charles Brooks is a 71 y.o. male last seen in September 2022.  He is here for a routine visit.  He tells me that he has been doing well, no active angina symptoms or nitroglycerin use.  Still active around his house and in the yard. ? ?He did see Dr. Donnetta Hutching in December 2022 secondary to left great toe ulcer.  ABIs were 0.66 on the right and 0.92 on the left.  Ulcer subsequently healed and he has not required any further intervention.  He does not describe any claudication. ? ?I reviewed his medications which are stable and noted below.  His last LDL was 62 on Lipitor.  I personally reviewed his ECG today which shows sinus bradycardia with nonspecific ST-T changes. ? ?Past Medical History:  ?Diagnosis Date  ? Arthritis   ? CAD (coronary artery disease), native coronary artery   ? DES RCA and PTCA RVM 07/2014, LVEF 55%  ? CKD (chronic kidney disease) stage 3, GFR 30-59 ml/min (HCC)   ? Essential hypertension   ? History of kidney stones   ? Hyperlipidemia   ? STEMI (ST elevation myocardial infarction) ([place])   ? 07/2014  ? ? ?Past Surgical History:  ?Procedure Laterality Date  ? CHOLECYSTECTOMY    ? COLONOSCOPY N/A 02/09/2021  ? Procedure: COLONOSCOPY;  Surgeon: Aviva Signs, MD;  [facility];  Service: Gastroenterology;  Laterality: N/A;  ? COLONOSCOPY WITH PROPOFOL N/A 11/08/2013  ? Procedure: COLONOSCOPY WITH PROPOFOL;  Surgeon: [name], MD;  Location: [facility];  Service: General;  Laterality: N/A;  in cecum at 0741; cecal withdrawal time = 10 min  ? EXTRACORPOREAL SHOCK WAVE LITHOTRIPSY    ? EXTRACORPOREAL SHOCK WAVE LITHOTRIPSY Right 09/22/2020  ? Procedure: EXTRACORPOREAL SHOCK WAVE LITHOTRIPSY (ESWL);  Surgeon:  Cleon Gustin, MD;  Location: [facility];  Service: Urology;  Laterality: Right;  cases not starting until 10:00 due to last minute add ons, need time to get covid tests back  ? INCISION AND DRAINAGE ABSCESS Right 08/12/2015  ? Procedure: INCISION AND DRAINAGE ABSCESS;  Surgeon: Aviva Signs, MD;  Location: [facility];  Service: General;  Laterality: Right;  ? KNEE ARTHROSCOPY Right   ? LEFT HEART CATHETERIZATION WITH CORONARY ANGIOGRAM N/A 08/14/2014  ? Procedure: LEFT HEART CATHETERIZATION WITH CORONARY ANGIOGRAM;  Surgeon: Troy Sine, MD;  LAD 50-60%, CFX OK, OM1 80%(small), RCA 100>>0% w/ 2.25x24 mm Synergy DES, EF 55%  ? POLYPECTOMY N/A 11/08/2013  ? Procedure: POLYPECTOMY;  Surgeon: [name], MD;  Location: [facility];  Service: General;  Laterality: N/A;  cecal polyp  ? POLYPECTOMY  02/09/2021  ? Procedure: POLYPECTOMY INTESTINAL;  Surgeon: Aviva Signs, MD;  [facility];  Service: Gastroenterology;;  ? Removal of kidney stones    ? Open  ? WOUND DEBRIDEMENT Right 10/26/2015  ? Procedure: DEBRIDEMENT WOUND RIGHT FLANK;  Surgeon: Aviva Signs, MD;  Location: [facility];  Service: General;  Laterality: Right;  ? ? ?Current Outpatient Medications  ?Medication Sig Dispense Refill  ? aspirin 81 MG EC tablet Take 1 tablet (81 mg total) by mouth daily with breakfast. 30 tablet 1  ? atorvastatin (LIPITOR) 40 MG tablet Take  1 tablet (40 mg total) by mouth daily. 10 tablet 0  ? metoprolol tartrate (LOPRESSOR) 25 MG tablet Take 25 mg by mouth 2 (two) times daily.    ? nitroGLYCERIN (NITROSTAT) 0.4 MG SL tablet Place 1 tablet (0.4 mg total) under the tongue every 5 (five) minutes x 3 doses as needed for chest pain. 25 tablet 12  ? vitamin B-12 (CYANOCOBALAMIN) 1000 MCG tablet Take 1,000 mcg by mouth daily.    ? zinc gluconate 50 MG tablet Take 50 mg by mouth daily.    ? ?No current facility-administered medications for this visit.  ? ?Allergies:  Codeine  ? ?ROS:  No syncope. No claudication. ? ?Physical  Exam: ?VS:  BP 128/64   Pulse (!) 51   Ht 6' (1.829 m)   Wt 192 lb 9.6 oz (87.4 kg)   SpO2 97%   BMI 26.12 kg/m? , BMI Body mass index is 26.12 kg/m?. ? ?Wt Readings from Last 3 Encounters:  ?09/02/21 192 lb 9.6 oz (87.4 kg)  ?06/09/21 182 lb 12.8 oz (82.9 kg)  ?02/26/21 183 lb 12.8 oz (83.4 kg)  ?  ?General: Patient appears comfortable at rest. ?HEENT: Conjunctiva and lids normal, wearing a mask. ?Neck: Supple, no elevated JVP or carotid bruits, no thyromegaly. ?Lungs: Clear to auscultation, nonlabored breathing at rest. ?Cardiac: Regular rate and rhythm, no S3 or significant systolic murmur, no pericardial rub. ? ?ECG:  An ECG dated 08/20/2020 was personally reviewed today and demonstrated:  Sinus bradycardia with nonspecific ST-T changes. ? ?Recent Labwork: ? ?July 2022: Hemoglobin 12.8, platelets 183, TSH 6.12, cholesterol 103, triglycerides 132, HDL 27, LDL 62, BUN 24, creatinine 1.99, potassium 4.3, AST 17, ALT 16 ? ?Other Studies Reviewed Today: ? ?No interval cardiac testing for review today. ? ?Assessment and Plan: ? ?1.  Symptomatically stable CAD status post DES to the RCA and angioplasty of the RV marginal in 2016.  He remains comfortable with observation on medical therapy in the absence of any significant angina symptoms.  ECG reviewed.  Continue aspirin, Lopressor, and Lipitor. ? ?2.  Mixed hyperlipidemia, tolerating Lipitor with last LDL 62. ? ?3.  Essential hypertension, blood pressure is well controlled today.  No changes were made. ? ?Medication Adjustments/Labs and Tests Ordered: ?Current medicines are reviewed at length with the patient today.  Concerns regarding medicines are outlined above.  ? ?Tests Ordered: ?Orders Placed This Encounter  ?Procedures  ? EKG 12-Lead  ? ? ?Medication Changes: ?No orders of the defined types were placed in this encounter. ? ? ?Disposition:  Follow up  6 months. ? ?Signed, ?Satira Sark, MD, [facility] ?09/02/2021 10:11 AM    ?[facility] ?[address], [place], [place] [address] ?Phone: [phone]; Fax: [phone]  ?

## 2021-09-02 ENCOUNTER — Encounter: Payer: Self-pay | Admitting: Cardiology

## 2021-09-02 ENCOUNTER — Ambulatory Visit: Payer: PPO | Admitting: Cardiology

## 2021-09-02 VITALS — BP 128/64 | HR 51 | Ht 72.0 in | Wt 192.6 lb

## 2021-09-02 DIAGNOSIS — I25119 Atherosclerotic heart disease of native coronary artery with unspecified angina pectoris: Secondary | ICD-10-CM

## 2021-09-02 DIAGNOSIS — E782 Mixed hyperlipidemia: Secondary | ICD-10-CM | POA: Diagnosis not present

## 2021-09-02 DIAGNOSIS — I10 Essential (primary) hypertension: Secondary | ICD-10-CM | POA: Diagnosis not present

## 2021-09-02 NOTE — Patient Instructions (Signed)

## 2021-09-20 DIAGNOSIS — E1140 Type 2 diabetes mellitus with diabetic neuropathy, unspecified: Secondary | ICD-10-CM | POA: Diagnosis not present

## 2021-09-20 DIAGNOSIS — I739 Peripheral vascular disease, unspecified: Secondary | ICD-10-CM | POA: Diagnosis not present

## 2021-09-20 DIAGNOSIS — L97524 Non-pressure chronic ulcer of other part of left foot with necrosis of bone: Secondary | ICD-10-CM | POA: Diagnosis not present

## 2021-10-14 ENCOUNTER — Inpatient Hospital Stay (HOSPITAL_COMMUNITY): Payer: PPO | Admitting: Anesthesiology

## 2021-10-14 ENCOUNTER — Emergency Department (HOSPITAL_COMMUNITY): Payer: PPO

## 2021-10-14 ENCOUNTER — Other Ambulatory Visit: Payer: Self-pay

## 2021-10-14 ENCOUNTER — Observation Stay (HOSPITAL_COMMUNITY)
Admission: EM | Admit: 2021-10-14 | Discharge: 2021-10-15 | Disposition: A | Discharge status: Home / Self Care | Payer: PPO | Attending: Family Medicine | Admitting: Family Medicine

## 2021-10-14 ENCOUNTER — Encounter (HOSPITAL_COMMUNITY): Payer: Self-pay | Admitting: *Deleted

## 2021-10-14 ENCOUNTER — Inpatient Hospital Stay (HOSPITAL_COMMUNITY): Payer: PPO

## 2021-10-14 ENCOUNTER — Encounter (HOSPITAL_COMMUNITY)
Admission: EM | Disposition: A | Discharge status: Home / Self Care | Payer: Self-pay | Source: Home / Self Care | Attending: Emergency Medicine

## 2021-10-14 DIAGNOSIS — E875 Hyperkalemia: Secondary | ICD-10-CM | POA: Diagnosis not present

## 2021-10-14 DIAGNOSIS — Z789 Other specified health status: Secondary | ICD-10-CM | POA: Diagnosis not present

## 2021-10-14 DIAGNOSIS — Z9049 Acquired absence of other specified parts of digestive tract: Secondary | ICD-10-CM | POA: Diagnosis not present

## 2021-10-14 DIAGNOSIS — Z87891 Personal history of nicotine dependence: Secondary | ICD-10-CM

## 2021-10-14 DIAGNOSIS — R109 Unspecified abdominal pain: Secondary | ICD-10-CM | POA: Diagnosis not present

## 2021-10-14 DIAGNOSIS — Z79899 Other long term (current) drug therapy: Secondary | ICD-10-CM | POA: Diagnosis not present

## 2021-10-14 DIAGNOSIS — N183 Chronic kidney disease, stage 3 unspecified: Secondary | ICD-10-CM | POA: Diagnosis not present

## 2021-10-14 DIAGNOSIS — E781 Pure hyperglyceridemia: Secondary | ICD-10-CM | POA: Diagnosis present

## 2021-10-14 DIAGNOSIS — I10 Essential (primary) hypertension: Secondary | ICD-10-CM | POA: Diagnosis not present

## 2021-10-14 DIAGNOSIS — I2510 Atherosclerotic heart disease of native coronary artery without angina pectoris: Secondary | ICD-10-CM

## 2021-10-14 DIAGNOSIS — N1832 Chronic kidney disease, stage 3b: Secondary | ICD-10-CM

## 2021-10-14 DIAGNOSIS — N200 Calculus of kidney: Secondary | ICD-10-CM | POA: Diagnosis not present

## 2021-10-14 DIAGNOSIS — D72829 Elevated white blood cell count, unspecified: Secondary | ICD-10-CM | POA: Diagnosis present

## 2021-10-14 DIAGNOSIS — N179 Acute kidney failure, unspecified: Secondary | ICD-10-CM | POA: Diagnosis not present

## 2021-10-14 DIAGNOSIS — Z87442 Personal history of urinary calculi: Secondary | ICD-10-CM | POA: Diagnosis not present

## 2021-10-14 DIAGNOSIS — R739 Hyperglycemia, unspecified: Secondary | ICD-10-CM | POA: Diagnosis not present

## 2021-10-14 DIAGNOSIS — N132 Hydronephrosis with renal and ureteral calculous obstruction: Secondary | ICD-10-CM | POA: Diagnosis not present

## 2021-10-14 DIAGNOSIS — Z7982 Long term (current) use of aspirin: Secondary | ICD-10-CM | POA: Insufficient documentation

## 2021-10-14 DIAGNOSIS — K5730 Diverticulosis of large intestine without perforation or abscess without bleeding: Secondary | ICD-10-CM | POA: Diagnosis not present

## 2021-10-14 DIAGNOSIS — I700 Atherosclerosis of aorta: Secondary | ICD-10-CM | POA: Diagnosis not present

## 2021-10-14 DIAGNOSIS — E785 Hyperlipidemia, unspecified: Secondary | ICD-10-CM | POA: Diagnosis present

## 2021-10-14 DIAGNOSIS — I129 Hypertensive chronic kidney disease with stage 1 through stage 4 chronic kidney disease, or unspecified chronic kidney disease: Secondary | ICD-10-CM | POA: Diagnosis not present

## 2021-10-14 DIAGNOSIS — K5790 Diverticulosis of intestine, part unspecified, without perforation or abscess without bleeding: Secondary | ICD-10-CM | POA: Diagnosis present

## 2021-10-14 DIAGNOSIS — N201 Calculus of ureter: Secondary | ICD-10-CM | POA: Diagnosis not present

## 2021-10-14 DIAGNOSIS — I252 Old myocardial infarction: Secondary | ICD-10-CM | POA: Insufficient documentation

## 2021-10-14 DIAGNOSIS — Z299 Encounter for prophylactic measures, unspecified: Secondary | ICD-10-CM | POA: Diagnosis not present

## 2021-10-14 HISTORY — PX: CYSTOSCOPY WITH STENT PLACEMENT: SHX5790

## 2021-10-14 HISTORY — PX: CYSTOSCOPY W/ RETROGRADES: SHX1426

## 2021-10-14 HISTORY — PX: URETEROSCOPY WITH HOLMIUM LASER LITHOTRIPSY: SHX6645

## 2021-10-14 LAB — CBC WITH DIFFERENTIAL/PLATELET
Abs Immature Granulocytes: 0.14 10*3/uL — ABNORMAL HIGH (ref 0.00–0.07)
Basophils Absolute: 0 10*3/uL (ref 0.0–0.1)
Basophils Relative: 0 %
Eosinophils Absolute: 0 10*3/uL (ref 0.0–0.5)
Eosinophils Relative: 0 %
HCT: 40.6 % (ref 39.0–52.0)
Hemoglobin: 13.2 g/dL (ref 13.0–17.0)
Immature Granulocytes: 1 %
Lymphocytes Relative: 5 %
Lymphs Abs: 0.8 10*3/uL (ref 0.7–4.0)
MCH: 31.6 pg (ref 26.0–34.0)
MCHC: 32.5 g/dL (ref 30.0–36.0)
MCV: 97.1 fL (ref 80.0–100.0)
Monocytes Absolute: 1.1 10*3/uL — ABNORMAL HIGH (ref 0.1–1.0)
Monocytes Relative: 7 %
Neutro Abs: 14.4 10*3/uL — ABNORMAL HIGH (ref 1.7–7.7)
Neutrophils Relative %: 87 %
Platelets: 165 10*3/uL (ref 150–400)
RBC: 4.18 MIL/uL — ABNORMAL LOW (ref 4.22–5.81)
RDW: 13.9 % (ref 11.5–15.5)
WBC: 16.4 10*3/uL — ABNORMAL HIGH (ref 4.0–10.5)
nRBC: 0 % (ref 0.0–0.2)

## 2021-10-14 LAB — URINALYSIS, ROUTINE W REFLEX MICROSCOPIC
Bilirubin Urine: NEGATIVE
Glucose, UA: NEGATIVE mg/dL
Ketones, ur: NEGATIVE mg/dL
Nitrite: NEGATIVE
Protein, ur: 30 mg/dL — AB
Specific Gravity, Urine: 1.012 (ref 1.005–1.030)
pH: 5 (ref 5.0–8.0)

## 2021-10-14 LAB — COMPREHENSIVE METABOLIC PANEL
ALT: 21 U/L (ref 0–44)
AST: 16 U/L (ref 15–41)
Albumin: 4 g/dL (ref 3.5–5.0)
Alkaline Phosphatase: 60 U/L (ref 38–126)
Anion gap: 7 (ref 5–15)
BUN: 32 mg/dL — ABNORMAL HIGH (ref 8–23)
CO2: 21 mmol/L — ABNORMAL LOW (ref 22–32)
Calcium: 9.2 mg/dL (ref 8.9–10.3)
Chloride: 108 mmol/L (ref 98–111)
Creatinine, Ser: 3.35 mg/dL — ABNORMAL HIGH (ref 0.61–1.24)
GFR, Estimated: 19 mL/min — ABNORMAL LOW (ref >60)
Glucose, Bld: 103 mg/dL — ABNORMAL HIGH (ref 70–99)
Potassium: 4.8 mmol/L (ref 3.5–5.1)
Sodium: 136 mmol/L (ref 135–145)
Total Bilirubin: 0.9 mg/dL (ref 0.3–1.2)
Total Protein: 7.3 g/dL (ref 6.5–8.1)

## 2021-10-14 LAB — LACTIC ACID, PLASMA: Lactic Acid, Venous: 1.4 mmol/L (ref 0.5–1.9)

## 2021-10-14 SURGERY — URETEROSCOPY, WITH LITHOTRIPSY USING HOLMIUM LASER
Anesthesia: General | Laterality: Right

## 2021-10-14 MED ORDER — FENTANYL CITRATE (PF) 100 MCG/2ML IJ SOLN
INTRAMUSCULAR | Status: DC | PRN
Start: 2021-10-14 — End: 2021-10-14
  Administered 2021-10-14 (×4): 50 ug via INTRAVENOUS

## 2021-10-14 MED ORDER — FENTANYL CITRATE (PF) 100 MCG/2ML IJ SOLN
INTRAMUSCULAR | Status: AC
Start: 2021-10-14 — End: ?
  Filled 2021-10-14: qty 2

## 2021-10-14 MED ORDER — ONDANSETRON HCL 4 MG PO TABS: 4.0000 mg | ORAL_TABLET | Freq: Four times a day (QID) | ORAL | Status: DC | PRN

## 2021-10-14 MED ORDER — HEPARIN SODIUM (PORCINE) 5000 UNIT/ML IJ SOLN
5000.0000 [IU] | Freq: Three times a day (TID) | INTRAMUSCULAR | Status: DC
  Administered 2021-10-15: 5000 [IU] via SUBCUTANEOUS
  Filled 2021-10-14: qty 1

## 2021-10-14 MED ORDER — ONDANSETRON HCL 4 MG/2ML IJ SOLN
4.0000 mg | Freq: Four times a day (QID) | INTRAMUSCULAR | Status: DC | PRN
Start: 2021-10-14 — End: 2021-10-15

## 2021-10-14 MED ORDER — LIDOCAINE HCL (PF) 2 % IJ SOLN
INTRAMUSCULAR | Status: AC
  Filled 2021-10-14: qty 5

## 2021-10-14 MED ORDER — METOPROLOL TARTRATE 25 MG PO TABS
25.0000 mg | ORAL_TABLET | Freq: Two times a day (BID) | ORAL | Status: DC
  Administered 2021-10-15: 25 mg via ORAL
  Filled 2021-10-14: qty 1

## 2021-10-14 MED ORDER — PHENYLEPHRINE 80 MCG/ML (10ML) SYRINGE FOR IV PUSH (FOR BLOOD PRESSURE SUPPORT)
PREFILLED_SYRINGE | INTRAVENOUS | Status: AC
  Filled 2021-10-14: qty 10

## 2021-10-14 MED ORDER — SODIUM CHLORIDE 0.9 % IR SOLN
Status: DC | PRN
Start: 2021-10-14 — End: 2021-10-14
  Administered 2021-10-14: 3000 mL

## 2021-10-14 MED ORDER — ONDANSETRON HCL 4 MG/2ML IJ SOLN
INTRAMUSCULAR | Status: AC
  Filled 2021-10-14: qty 2

## 2021-10-14 MED ORDER — DEXAMETHASONE SODIUM PHOSPHATE 10 MG/ML IJ SOLN
INTRAMUSCULAR | Status: DC | PRN
  Administered 2021-10-14: 5 mg via INTRAVENOUS

## 2021-10-14 MED ORDER — PROPOFOL 10 MG/ML IV BOLUS
INTRAVENOUS | Status: DC | PRN
  Administered 2021-10-14: 120 mg via INTRAVENOUS

## 2021-10-14 MED ORDER — ZINC SULFATE 220 (50 ZN) MG PO CAPS
220.0000 mg | ORAL_CAPSULE | Freq: Every day | ORAL | Status: DC
  Administered 2021-10-15: 220 mg via ORAL
  Filled 2021-10-14: qty 1

## 2021-10-14 MED ORDER — PHENAZOPYRIDINE HCL 100 MG PO TABS: 200.0000 mg | ORAL_TABLET | Freq: Three times a day (TID) | ORAL | Status: DC | PRN

## 2021-10-14 MED ORDER — PHENYLEPHRINE 80 MCG/ML (10ML) SYRINGE FOR IV PUSH (FOR BLOOD PRESSURE SUPPORT)
PREFILLED_SYRINGE | INTRAVENOUS | Status: DC | PRN
  Administered 2021-10-14: 160 ug via INTRAVENOUS
  Administered 2021-10-14: 240 ug via INTRAVENOUS
  Administered 2021-10-14: 160 ug via INTRAVENOUS
  Administered 2021-10-14: 240 ug via INTRAVENOUS
  Administered 2021-10-14 (×4): 160 ug via INTRAVENOUS

## 2021-10-14 MED ORDER — DEXMEDETOMIDINE (PRECEDEX) IN NS 20 MCG/5ML (4 MCG/ML) IV SYRINGE
PREFILLED_SYRINGE | INTRAVENOUS | Status: DC | PRN
  Administered 2021-10-14: 8 ug via INTRAVENOUS

## 2021-10-14 MED ORDER — ONDANSETRON HCL 4 MG/2ML IJ SOLN
INTRAMUSCULAR | Status: DC | PRN
  Administered 2021-10-14: 4 mg via INTRAVENOUS

## 2021-10-14 MED ORDER — SUCCINYLCHOLINE CHLORIDE 200 MG/10ML IV SOSY
PREFILLED_SYRINGE | INTRAVENOUS | Status: DC | PRN
  Administered 2021-10-14: 120 mg via INTRAVENOUS
  Administered 2021-10-14: 60 mg via INTRAVENOUS

## 2021-10-14 MED ORDER — SODIUM CHLORIDE 0.9 % IV SOLN: INTRAVENOUS | Status: DC

## 2021-10-14 MED ORDER — NITROGLYCERIN 0.4 MG SL SUBL: 0.4000 mg | SUBLINGUAL_TABLET | SUBLINGUAL | Status: DC | PRN

## 2021-10-14 MED ORDER — OXYCODONE HCL 5 MG PO TABS: 5.0000 mg | ORAL_TABLET | ORAL | Status: DC | PRN

## 2021-10-14 MED ORDER — SODIUM CHLORIDE 0.9 % IV BOLUS
1000.0000 mL | Freq: Once | INTRAVENOUS | Status: AC
  Administered 2021-10-14: 1000 mL via INTRAVENOUS

## 2021-10-14 MED ORDER — SUCCINYLCHOLINE CHLORIDE 200 MG/10ML IV SOSY
PREFILLED_SYRINGE | INTRAVENOUS | Status: AC
  Filled 2021-10-14: qty 10

## 2021-10-14 MED ORDER — PROPOFOL 10 MG/ML IV BOLUS
INTRAVENOUS | Status: AC
  Filled 2021-10-14: qty 20

## 2021-10-14 MED ORDER — FENTANYL CITRATE PF 50 MCG/ML IJ SOSY: 12.5000 ug | PREFILLED_SYRINGE | INTRAMUSCULAR | Status: DC | PRN

## 2021-10-14 MED ORDER — LACTATED RINGERS IV BOLUS
500.0000 mL | Freq: Once | INTRAVENOUS | Status: AC
  Administered 2021-10-14: 500 mL via INTRAVENOUS

## 2021-10-14 MED ORDER — LACTATED RINGERS IV SOLN: INTRAVENOUS | Status: DC | PRN

## 2021-10-14 MED ORDER — SODIUM CHLORIDE 0.9 % IV SOLN: 2.0000 g | INTRAVENOUS | Status: DC

## 2021-10-14 MED ORDER — ATORVASTATIN CALCIUM 40 MG PO TABS
40.0000 mg | ORAL_TABLET | Freq: Every evening | ORAL | Status: DC
  Filled 2021-10-14: qty 1

## 2021-10-14 MED ORDER — DEXMEDETOMIDINE HCL IN NACL 80 MCG/20ML IV SOLN
INTRAVENOUS | Status: AC
  Filled 2021-10-14: qty 20

## 2021-10-14 MED ORDER — DIATRIZOATE MEGLUMINE 30 % UR SOLN
URETHRAL | Status: AC
  Filled 2021-10-14: qty 100

## 2021-10-14 MED ORDER — VITAMIN B-12 1000 MCG PO TABS
1000.0000 ug | ORAL_TABLET | Freq: Every day | ORAL | Status: DC
  Administered 2021-10-15: 1000 ug via ORAL
  Filled 2021-10-14: qty 1

## 2021-10-14 MED ORDER — DIATRIZOATE MEGLUMINE 30 % UR SOLN
URETHRAL | Status: DC | PRN
  Administered 2021-10-14: 20 mL via URETHRAL

## 2021-10-14 MED ORDER — SODIUM CHLORIDE 0.9 % IV SOLN
2.0000 g | Freq: Once | INTRAVENOUS | Status: AC
Start: 2021-10-14 — End: 2021-10-14
  Administered 2021-10-14: 2 g via INTRAVENOUS
  Filled 2021-10-14: qty 20

## 2021-10-14 MED ORDER — LIDOCAINE HCL (PF) 2 % IJ SOLN
INTRAMUSCULAR | Status: DC | PRN
  Administered 2021-10-14: 100 mg via INTRADERMAL

## 2021-10-14 MED ORDER — STERILE WATER FOR IRRIGATION IR SOLN
Status: DC | PRN
  Administered 2021-10-14: 10 mL

## 2021-10-14 MED ORDER — DEXAMETHASONE SODIUM PHOSPHATE 10 MG/ML IJ SOLN
INTRAMUSCULAR | Status: AC
  Filled 2021-10-14: qty 1

## 2021-10-14 MED ORDER — SODIUM CHLORIDE 0.9 % IV BOLUS
1000.0000 mL | Freq: Once | INTRAVENOUS | Status: AC
Start: 2021-10-14 — End: 2021-10-14
  Administered 2021-10-14: 1000 mL via INTRAVENOUS

## 2021-10-14 MED ORDER — FENTANYL CITRATE (PF) 100 MCG/2ML IJ SOLN
INTRAMUSCULAR | Status: AC
  Filled 2021-10-14: qty 2

## 2021-10-14 MED ORDER — ESMOLOL HCL 100 MG/10ML IV SOLN
INTRAVENOUS | Status: AC
  Filled 2021-10-14: qty 10

## 2021-10-14 MED ORDER — LABETALOL HCL 5 MG/ML IV SOLN: INTRAVENOUS | Status: DC | PRN

## 2021-10-14 MED ORDER — LABETALOL HCL 5 MG/ML IV SOLN
INTRAVENOUS | Status: DC | PRN
Start: 2021-10-14 — End: 2021-10-14
  Administered 2021-10-14: 10 mg via INTRAVENOUS

## 2021-10-14 MED ORDER — ASPIRIN EC 81 MG PO TBEC
81.0000 mg | DELAYED_RELEASE_TABLET | Freq: Every day | ORAL | Status: DC
  Administered 2021-10-15: 81 mg via ORAL
  Filled 2021-10-14: qty 1

## 2021-10-14 MED ORDER — GABAPENTIN 100 MG PO CAPS
100.0000 mg | ORAL_CAPSULE | Freq: Two times a day (BID) | ORAL | Status: DC
  Administered 2021-10-15: 100 mg via ORAL
  Filled 2021-10-14: qty 1

## 2021-10-14 MED ORDER — ONDANSETRON HCL 4 MG/2ML IJ SOLN
4.0000 mg | Freq: Once | INTRAMUSCULAR | Status: AC
  Administered 2021-10-14: 4 mg via INTRAVENOUS
  Filled 2021-10-14: qty 2

## 2021-10-14 MED ORDER — HYDROMORPHONE HCL 1 MG/ML IJ SOLN: 0.2500 mg | INTRAMUSCULAR | Status: DC | PRN

## 2021-10-14 MED ORDER — ACETAMINOPHEN 650 MG RE SUPP
650.0000 mg | Freq: Four times a day (QID) | RECTAL | Status: DC
  Filled 2021-10-14: qty 1

## 2021-10-14 MED ORDER — LABETALOL HCL 5 MG/ML IV SOLN
INTRAVENOUS | Status: AC
  Filled 2021-10-14: qty 4

## 2021-10-14 MED ORDER — KETOROLAC TROMETHAMINE 30 MG/ML IJ SOLN
15.0000 mg | Freq: Once | INTRAMUSCULAR | Status: AC
  Administered 2021-10-14: 15 mg via INTRAVENOUS
  Filled 2021-10-14: qty 1

## 2021-10-14 MED ORDER — ACETAMINOPHEN 325 MG PO TABS
650.0000 mg | ORAL_TABLET | Freq: Four times a day (QID) | ORAL | Status: DC
  Administered 2021-10-14 – 2021-10-15 (×3): 650 mg via ORAL
  Filled 2021-10-14 (×3): qty 2

## 2021-10-14 SURGICAL SUPPLY — 28 items
BAG DRAIN URO TABLE W/ADPT NS (BAG) ×4 IMPLANT
BAG DRN 8 ADPR NS SKTRN CSTL (BAG) ×2
BAG HAMPER (MISCELLANEOUS) ×4 IMPLANT
CATH URET 5FR 28IN OPEN ENDED (CATHETERS) ×4 IMPLANT
CLOTH BEACON ORANGE TIMEOUT ST (SAFETY) ×4 IMPLANT
DECANTER SPIKE VIAL GLASS SM (MISCELLANEOUS) ×4 IMPLANT
EXTRACTOR STONE NITINOL NGAGE (UROLOGICAL SUPPLIES) ×1 IMPLANT
GLOVE BIO SURGEON STRL SZ8 (GLOVE) ×4 IMPLANT
GLOVE BIOGEL PI IND STRL 7.0 (GLOVE) ×6 IMPLANT
GLOVE BIOGEL PI IND STRL 9 (GLOVE) IMPLANT
GLOVE BIOGEL PI INDICATOR 7.0 (GLOVE) ×2
GLOVE BIOGEL PI INDICATOR 9 (GLOVE) ×1
GLOVE ECLIPSE 6.5 STRL STRAW (GLOVE) ×1 IMPLANT
GLOVE INDICATOR 6.5 STRL GRN (GLOVE) ×1 IMPLANT
GOWN STRL REUS W/TWL LRG LVL3 (GOWN DISPOSABLE) ×8 IMPLANT
GOWN STRL REUS W/TWL XL LVL3 (GOWN DISPOSABLE) ×4 IMPLANT
GUIDEWIRE STR DUAL SENSOR (WIRE) ×1 IMPLANT
IV NS IRRIG 3000ML ARTHROMATIC (IV SOLUTION) ×4 IMPLANT
KIT TURNOVER CYSTO (KITS) ×4 IMPLANT
MANIFOLD NEPTUNE II (INSTRUMENTS) ×4 IMPLANT
PACK CYSTO (CUSTOM PROCEDURE TRAY) ×4 IMPLANT
PAD ARMBOARD 7.5X6 YLW CONV (MISCELLANEOUS) ×4 IMPLANT
STENT URET 6FRX26 CONTOUR (STENTS) ×2 IMPLANT
TOWEL NATURAL 4PK STERILE (DISPOSABLE) ×4 IMPLANT
TOWEL OR 17X26 4PK STRL BLUE (TOWEL DISPOSABLE) ×4 IMPLANT
TRACTIP FLEXIVA PULS ID 200XHI (Laser) IMPLANT
TRACTIP FLEXIVA PULSE ID 200 (Laser) ×3
WATER STERILE IRR 500ML POUR (IV SOLUTION) ×4 IMPLANT

## 2021-10-14 NOTE — Hospital Course (Signed)
71 year old gentleman with long history of nephrolithiasis, coronary artery disease, hyperlipidemia, stage III CKD, history of STEMI, hyperkalemia, hypertension, under the urological care of Dr. Alyson Ingles reported that he had lithotripsy done last year.  He had been doing fairly well until around 8 PM last night during a church service he started having severe left flank pain that persisted for most of the night.  He describes persistent intermittent waves of severe left flank pain associated with ongoing malaise.  He has had occasional bouts of nausea.  No emesis.  No chest pain and no shortness of breath.  He denies gross hematuria.  He had no difficulty urinating prior to arrival however has not been able to give a urine sample since arrival.  He was given IV fluids.  He was noted to have an elevated WBC.  He has been afebrile.  His blood pressures were soft initially.  He had a CT renal study done with findings of obstructive stones involving both kidneys.  Dr. Felipa Eth with urology was consulted and recommended that patient be admitted and planning to see the patient later today.  Hospitalist was consulted for admission. ?

## 2021-10-14 NOTE — Assessment & Plan Note (Signed)
--   resume home atorvastatin  ?

## 2021-10-14 NOTE — Anesthesia Preprocedure Evaluation (Signed)
Anesthesia Evaluation  ?Patient identified by MRN, date of birth, ID band ?Patient awake ? ? ? ?Reviewed: ?Allergy & Precautions, NPO status , Patient's Chart, lab work & pertinent test results, reviewed documented beta blocker date and time  ? ?Airway ?Mallampati: II ? ?TM Distance: >3 FB ?Neck ROM: Full ? ? ? Dental ? ?(+) Dental Advisory Given, Missing ?  ?Pulmonary ?neg pulmonary ROS, former smoker,  ?  ?Pulmonary exam normal ?breath sounds clear to auscultation ? ? ? ? ? ? Cardiovascular ?Exercise Tolerance: Good ?hypertension, Pt. on medications and Pt. on home beta blockers ?+ CAD, + Past MI and + Cardiac Stents  ?Normal cardiovascular exam ?Rhythm:Regular Rate:Normal ? ? ?  ?Neuro/Psych ?negative neurological ROS ? negative psych ROS  ? GI/Hepatic ?negative GI ROS, Neg liver ROS,   ?Endo/Other  ?negative endocrine ROS ? Renal/GU ?Renal disease  ?negative genitourinary ?  ?Musculoskeletal ? ?(+) Arthritis ,  ? Abdominal ?  ?Peds ?negative pediatric ROS ?(+)  Hematology ?negative hematology ROS ?(+)   ?Anesthesia Other Findings ? ? Reproductive/Obstetrics ?negative OB ROS ? ?  ? ? ? ? ? ? ? ? ? ? ? ? ? ?  ?  ? ? ? ? ? ? ? ?Anesthesia Physical ?Anesthesia Plan ? ?ASA: 3 and emergent ? ?Anesthesia Plan: General  ? ?Post-op Pain Management: Dilaudid IV  ? ?Induction: Intravenous ? ?PONV Risk Score and Plan: 4 or greater and Ondansetron and Dexamethasone ? ?Airway Management Planned: Oral ETT ? ?Additional Equipment:  ? ?Intra-op Plan:  ? ?Post-operative Plan: Extubation in OR ? ?Informed Consent: I have reviewed the patients History and Physical, chart, labs and discussed the procedure including the risks, benefits and alternatives for the proposed anesthesia with the patient or authorized representative who has indicated his/her understanding and acceptance.  ? ? ? ?Dental advisory given ? ?Plan Discussed with: CRNA and Surgeon ? ?Anesthesia Plan Comments:    ? ? ? ? ? ? ?Anesthesia Quick Evaluation ? ?

## 2021-10-14 NOTE — Assessment & Plan Note (Addendum)
--   urology consultation in ED, Dr. Felipa Eth treated patient and now has bilateral ureteral stents in place  ?

## 2021-10-14 NOTE — ED Triage Notes (Signed)
Pt c/o left flank pain that started last night; pt has a hx of kidney stones; pt denies any urinary sx ?

## 2021-10-14 NOTE — Anesthesia Postprocedure Evaluation (Signed)
Anesthesia Post Note ? ?Patient: Charles Brooks ? ?Procedure(s) Performed: URETEROSCOPY WITH HOLMIUM LASER LITHOTRIPSY (Right) ?CYSTOSCOPY WITH STENT PLACEMENT (Bilateral) ?CYSTOSCOPY WITH RETROGRADE PYELOGRAM (Bilateral) ? ?Patient location during evaluation: PACU ?Anesthesia Type: General ?Level of consciousness: awake and alert and oriented ?Pain management: pain level controlled ?Vital Signs Assessment: post-procedure vital signs reviewed and stable ?Respiratory status: spontaneous breathing, nonlabored ventilation and respiratory function stable ?Cardiovascular status: blood pressure returned to baseline and stable ?Postop Assessment: no apparent nausea or vomiting ?Anesthetic complications: no ? ? ?No notable events documented. ? ? ?Last Vitals:  ?Vitals:  ? 10/14/21 2012 10/14/21 2015  ?BP:  139/62  ?Pulse: 87 89  ?Resp: (!) 22 20  ?Temp:  37.6 ?C  ?SpO2: 100% 97%  ?  ?Last Pain:  ?Vitals:  ? 10/14/21 1707  ?TempSrc: Oral  ?PainSc: 0-No pain  ? ? ?  ?  ?  ?  ?  ?  ? ?Hermela Hardt C Manvir Prabhu ? ? ? ? ?

## 2021-10-14 NOTE — Assessment & Plan Note (Addendum)
--   urology consultation requested ?-- pt taken to OR on 4/27 for cystoscopy, right ureteroscopic laser lithotripsy, and insertion of bilateral stents ?-- he is feeling better and doing well postoperative.  ?-- he was cleared to discharge home by urologist ?-- he will need to discharge home on oral antibiotics  ?

## 2021-10-14 NOTE — Assessment & Plan Note (Addendum)
--   secondary to acute obstruction caused by kidney stone ?--  blood culture x 2 no growth to date  ?-- treated with high dose ceftriaxone IV ?-- WBC trending down ?-- discharge home on oral antibiotics per Dr. Felipa Eth  ?

## 2021-10-14 NOTE — Assessment & Plan Note (Signed)
--   resume home cardiac medications  ?

## 2021-10-14 NOTE — Assessment & Plan Note (Addendum)
--   resolved now after cystoscopy, right ureteroscopic laser lithotripsy and bilateral stent placement   ?-- pt seen by urology this morning and can discharge home on oral antibiotics, foley can be discontinued  ?

## 2021-10-14 NOTE — Assessment & Plan Note (Signed)
stable °

## 2021-10-14 NOTE — Transfer of Care (Signed)
Immediate Anesthesia Transfer of Care Note ? ?Patient: Charles Brooks ? ?Procedure(s) Performed: URETEROSCOPY WITH HOLMIUM LASER LITHOTRIPSY (Right) ?CYSTOSCOPY WITH STENT PLACEMENT (Bilateral) ?CYSTOSCOPY WITH RETROGRADE PYELOGRAM (Bilateral) ? ?Patient Location: PACU ? ?Anesthesia Type:General ? ?Level of Consciousness: awake, alert  and sedated ? ?Airway & Oxygen Therapy: Patient Spontanous Breathing and Patient connected to face mask oxygen ? ?Post-op Assessment: Post -op Vital signs reviewed and stable ? ?Post vital signs: Reviewed and stable ? ?Last Vitals:  ?Vitals Value Taken Time  ?BP 134/71 10/14/21 2000  ?Temp 100.2 10/14/21 2001  ?Pulse 88 10/14/21 2001  ?Resp 18 10/14/21 2001  ?SpO2 100 % 10/14/21 2001  ?Vitals shown include unvalidated device data. ? ?Last Pain:  ?Vitals:  ? 10/14/21 1707  ?TempSrc: Oral  ?PainSc: 0-No pain  ?   ? ?  ? ?Complications: No notable events documented. ?

## 2021-10-14 NOTE — Anesthesia Procedure Notes (Signed)
Procedure Name: Intubation ?Date/Time: 10/14/2021 6:43 PM ?Performed by: Denese Killings, MD ?Pre-anesthesia Checklist: Patient identified, Emergency Drugs available, Suction available and Patient being monitored ?Patient Re-evaluated:Patient Re-evaluated prior to induction ?Oxygen Delivery Method: Circle system utilized ?Preoxygenation: Pre-oxygenation with 100% oxygen ?Induction Type: IV induction ?Ventilation: Mask ventilation without difficulty ?Laryngoscope Size: Mac and 3 ?Grade View: Grade II ?Tube type: Oral ?Tube size: 7.5 mm ?Number of attempts: 1 ?Airway Equipment and Method: Stylet and Oral airway ?Placement Confirmation: ETT inserted through vocal cords under direct vision, positive ETCO2 and breath sounds checked- equal and bilateral ?Secured at: 22 cm ?Tube secured with: Tape ?Dental Injury: Teeth and Oropharynx as per pre-operative assessment  ? ? ? ? ?

## 2021-10-14 NOTE — Assessment & Plan Note (Addendum)
--   suspect reactive, monitor  ?-- he will need outpatient follow up with PCP for ongoing surveillance of this  ?

## 2021-10-14 NOTE — Assessment & Plan Note (Addendum)
--   Obstructive and possibly some prerenal causes ?-- follow up urology recommendations ?-- renal function is improving after bilateral ureteral stone placement  ?-- pt has been urinating with no difficulty ?

## 2021-10-14 NOTE — H&P (Signed)
?History and Physical  ?[place] ? ?HARDING THOMURE JJK:[id number] DOB: 1951-06-18 DOA: 10/14/2021 ? ?PCP: [name], MD  ?Patient coming from: Home  ?Level of care: Telemetry ? ?I have personally briefly reviewed patient's old medical records in [place] ? ?Chief Complaint: left flank pain  ? ?HPI: Charles Brooks is a 70 year old gentleman with long history of nephrolithiasis, coronary artery disease, hyperlipidemia, stage III CKD, history of STEMI, hyperkalemia, hypertension, under the urological care of Dr. Alyson Ingles reported that he had lithotripsy done last year.  He had been doing fairly well until around 8 PM last night during a church service he started having severe left flank pain that persisted for most of the night.  He describes persistent intermittent waves of severe left flank pain associated with ongoing malaise.  He has had occasional bouts of nausea.  No emesis.  No chest pain and no shortness of breath.  He denies gross hematuria.  He had no difficulty urinating prior to arrival however has not been able to give a urine sample since arrival.  He was given IV fluids.  He was noted to have an elevated WBC.  He has been afebrile.  His blood pressures were soft initially.  He had a CT renal study done with findings of obstructive stones involving both kidneys.  Dr. Felipa Eth with urology was consulted and recommended that patient be admitted and planning to see the patient later today.  Hospitalist was consulted for admission. ? ?Review of Systems: Review of Systems  ?Constitutional:  Positive for chills and malaise/fatigue.  ?HENT: Negative.    ?Eyes: Negative.   ?Respiratory: Negative.    ?Cardiovascular: Negative.   ?Gastrointestinal:  Positive for diarrhea and nausea. Negative for blood in stool, constipation and vomiting.  ?Genitourinary:  Positive for flank pain. Negative for dysuria.  ?Musculoskeletal:  Negative for back pain, falls, myalgias and neck pain.  ?Skin: Negative.    ?Neurological: Negative.   ?Endo/Heme/Allergies: Negative.   ?Psychiatric/Behavioral: Negative.    ?All other systems reviewed and are negative. ?  ?Past Medical History:  ?Diagnosis Date  ? Arthritis   ? CAD (coronary artery disease), native coronary artery   ? DES RCA and PTCA RVM 07/2014, LVEF 55%  ? Cellulitis and abscess 08/08/2015  ? CKD (chronic kidney disease) stage 3, GFR 30-59 ml/min (HCC)   ? Essential hypertension   ? History of kidney stones   ? Hyperkalemia 07/06/2019  ? Hyperlipidemia   ? STEMI (ST elevation myocardial infarction) ([place])   ? 07/2014  ? ? ?Past Surgical History:  ?Procedure Laterality Date  ? CHOLECYSTECTOMY    ? COLONOSCOPY N/A 02/09/2021  ? Procedure: COLONOSCOPY;  Surgeon: Aviva Signs, MD;  [facility];  Service: Gastroenterology;  Laterality: N/A;  ? COLONOSCOPY WITH PROPOFOL N/A 11/08/2013  ? Procedure: COLONOSCOPY WITH PROPOFOL;  Surgeon: [name], MD;  Location: [facility];  Service: General;  Laterality: N/A;  in cecum at 0741; cecal withdrawal time = 10 min  ? EXTRACORPOREAL SHOCK WAVE LITHOTRIPSY    ? EXTRACORPOREAL SHOCK WAVE LITHOTRIPSY Right 09/22/2020  ? Procedure: EXTRACORPOREAL SHOCK WAVE LITHOTRIPSY (ESWL);  Surgeon: Cleon Gustin, MD;  Location: [facility];  Service: Urology;  Laterality: Right;  cases not starting until 10:00 due to last minute add ons, need time to get covid tests back  ? INCISION AND DRAINAGE ABSCESS Right 08/12/2015  ? Procedure: INCISION AND DRAINAGE ABSCESS;  Surgeon: Aviva Signs, MD;  Location: [facility];  Service: General;  Laterality: Right;  ? KNEE ARTHROSCOPY Right   ? LEFT HEART CATHETERIZATION WITH CORONARY ANGIOGRAM N/A 08/14/2014  ? Procedure: LEFT HEART CATHETERIZATION WITH CORONARY ANGIOGRAM;  Surgeon: Troy Sine, MD;  LAD 50-60%, CFX OK, OM1 80%(small), RCA 100>>0% w/ 2.25x24 mm Synergy DES, EF 55%  ? POLYPECTOMY N/A 11/08/2013  ? Procedure: POLYPECTOMY;  Surgeon: [name], MD;  Location: [facility];  Service: General;   Laterality: N/A;  cecal polyp  ? POLYPECTOMY  02/09/2021  ? Procedure: POLYPECTOMY INTESTINAL;  Surgeon: Aviva Signs, MD;  [facility];  Service: Gastroenterology;;  ? Removal of kidney stones    ? Open  ? WOUND DEBRIDEMENT Right 10/26/2015  ? Procedure: DEBRIDEMENT WOUND RIGHT FLANK;  Surgeon: Aviva Signs, MD;  Location: [facility];  Service: General;  Laterality: Right;  ? ? ? reports that he quit smoking about 17 years ago. His smoking use included cigars. He has never used smokeless tobacco. He reports that he does not drink alcohol and does not use drugs. ? ?Allergies  ?Allergen Reactions  ? Codeine Hives  ? ? ?Family History  ?Problem Relation Age of Onset  ? Heart attack Brother   ? ? ?Prior to Admission medications   ?Medication Sig Start Date End Date Taking? Authorizing Provider  ?aspirin 81 MG EC tablet Take 1 tablet (81 mg total) by mouth daily with breakfast. 07/07/19  Yes Emokpae, Courage, MD  ?atorvastatin (LIPITOR) 40 MG tablet Take 1 tablet (40 mg total) by mouth daily. 07/29/16  Yes Satira Sark, MD  ?gabapentin (NEURONTIN) 100 MG capsule Take 100 mg by mouth 3 (three) times daily. 09/08/21  Yes [provider]  ?metoprolol tartrate (LOPRESSOR) 25 MG tablet Take 25 mg by mouth 2 (two) times daily.   Yes [provider]  ?nitroGLYCERIN (NITROSTAT) 0.4 MG SL tablet Place 1 tablet (0.4 mg total) under the tongue every 5 (five) minutes x 3 doses as needed for chest pain. 02/26/21  Yes Satira Sark, MD  ?vitamin B-12 (CYANOCOBALAMIN) 1000 MCG tablet Take 1,000 mcg by mouth daily.   Yes [provider]  ?zinc gluconate 50 MG tablet Take 50 mg by mouth daily.   Yes [provider]  ? ? ?Physical Exam: ?Vitals:  ? 10/14/21 1230 10/14/21 1300 10/14/21 1330 10/14/21 1400  ?BP: 101/60 (!) 103/57 (!) 97/55 (!) 100/54  ?Pulse: 79 75 81 86  ?Resp: 17 19 (!) 22 20  ?Temp:      ?TempSrc:      ?SpO2: 97% 100% 99% 98%  ?Weight:      ?Height:      ? ? ?Constitutional:  dry mucus membranes, NAD, calm, comfortable ?Eyes: PERRL, lids and conjunctivae normal ?ENMT: Mucous membranes are dry. Posterior pharynx clear of any exudate or lesions.Normal dentition.  ?Neck: normal, supple, no masses, no thyromegaly ?Respiratory: clear to auscultation bilaterally, no wheezing, no crackles. Normal respiratory effort. No accessory muscle use.  ?Cardiovascular: normal s1, s2 sounds, no murmurs / rubs / gallops. No extremity edema. 2+ pedal pulses. No carotid bruits.  ?Abdomen: no tenderness, no masses palpated. No hepatosplenomegaly. Bowel sounds positive.  ?Musculoskeletal: no clubbing / cyanosis. No joint deformity upper and lower extremities. Good ROM, no contractures. Normal muscle tone.  ?Skin: no rashes, lesions, ulcers. No induration ?Neurologic: CN 2-12 grossly intact. Sensation intact, DTR normal. Strength 5/5 in all 4.  ?Psychiatric: Normal judgment and insight. Alert and oriented x 3. Normal mood.  ? ?Labs on Admission:  I have personally reviewed following labs and imaging studies ? ?CBC: ?Recent Labs  ?Lab 10/14/21 ?1224  ?WBC 16.4*  ?NEUTROABS 14.4*  ?HGB 13.2  ?HCT 40.6  ?MCV 97.1  ?PLT 165  ? ?Basic Metabolic Panel: ?Recent Labs  ?Lab 10/14/21 ?1224  ?NA 136  ?K 4.8  ?CL 108  ?CO2 21*  ?GLUCOSE 103*  ?BUN 32*  ?CREATININE 3.35*  ?CALCIUM 9.2  ? ?GFR: ?Estimated Creatinine Clearance: 22.2 mL/min (A) (by C-G formula based on SCr of 3.35 mg/dL (H)). ?Liver Function Tests: ?Recent Labs  ?Lab 10/14/21 ?1224  ?AST 16  ?ALT 21  ?ALKPHOS 60  ?BILITOT 0.9  ?PROT 7.3  ?ALBUMIN 4.0  ? ?No results for input(s): LIPASE, AMYLASE in the last 168 hours. ?No results for input(s): AMMONIA in the last 168 hours. ?Coagulation Profile: ?No results for input(s): INR, PROTIME in the last 168 hours. ?Cardiac Enzymes: ?No results for input(s): CKTOTAL, CKMB, CKMBINDEX, TROPONINI in the last 168 hours. ?BNP (last 3 results) ?No results for input(s): PROBNP in the last 8760 hours. ?HbA1C: ?No results for  input(s): HGBA1C in the last 72 hours. ?CBG: ?No results for input(s): GLUCAP in the last 168 hours. ?Lipid Profile: ?No results for input(s): CHOL, HDL, LDLCALC, TRIG, CHOLHDL, LDLDIRECT in the last 72 h

## 2021-10-14 NOTE — Op Note (Signed)
OPERATIVE NOTE ? ? ?Patient Name: Charles Brooks ? ?MRN:  [id number] ? ?Date of Procedure: 10/14/21 ? ?Preoperative diagnosis:  ?Bilateral ureteral calculi with obstruction ?Acute kidney injury ? ?Postoperative diagnosis:  ?Bilateral ureteral calculi with obstruction ?Acute kidney injury ? ?Procedure:  ?Cystoscopy ?Retrograde pyelograms ?Right ureteroscopic laser lithotripsy (stone obtained) ?Insertion of bilateral ureteral stents (6 [place] by 26 cm, no tether) ? ?Attending: Primus Bravo, MD ? ?Anesthesia: General ? ?Estimated blood loss: 0 mL ? ?Fluids: Per anesthesia record ? ?Drains: Bilateral 6 French by 26 cm ureteral stents, no tether; Foley catheter ? ?Specimens: Stone fragments ? ?Antibiotics: Rocephin IV ? ?Findings: Normal urethra and bladder; obstructing distal right ureteral calculus; obstructing mid left ureteral calculus ? ?Indications:  ?71 year old male with a history of nephrolithiasis presented to the emergency room earlier today with acute onset of left-sided flank pain.  Evaluation with CT imaging demonstrated a 1.2 cm obstructing left mid ureteral calculus and a 1 cm obstructing right distal ureteral calculus.  The patient's creatinine was elevated from his baseline to 3.35.  He had subjective chills but no fever.  Urinalysis showed WBCs and bacteria but was nitrite negative.  Lactate was normal.  Given the bilateral ureteral obstruction, acute kidney injury, and possible UTI, he presents now for surgical management with cystoscopy, bilateral retrograde pyelograms, possible ureteroscopic laser lithotripsy, and insertion of bilateral ureteral stents.  Risk and benefits were discussed in detail.  He understands wishes to proceed as described. ? ?Description of Procedure:  ?The patient had received IV Rocephin earlier today.  After successful induction of a general anesthetic, the patient was placed in the dorsolithotomy position.  The patient's genital area was prepped and draped in  sterile fashion.  Under direct visualization, a 21 French rigid cystoscope was passed through the urethra into the bladder.  No urethral abnormalities were appreciated.  The bladder was normal in appearance.  A single ureteral orifice was noted bilaterally.  No mucosal lesions were seen throughout careful inspection of the bladder. ? ?Bilateral retrograde pyelograms were performed for evaluation of the patient's upper tracts given his bilateral ureteral calculi.  Scout film showed a nonspecific bowel gas pattern and no obvious bony abnormalities.  A 1 cm calcification was noted in the right pelvis and a 1 cm calcification was noted in the mid left abdomen.  Using a 5 [place] open-ended catheter, contrast was injected to the right ureter.  The previously noted calcification was confirmed to be within the distal right ureter.  There was dilation of the ureter proximally.  In a similar fashion, contrast was injected into the left ureter.  The left distal ureter was normal.  The previously noted calcification was confirmed to be within the mid left ureter.  There was dilation of the ureter to the renal pelvis and calyces on the left. ? ?The open-ended catheter was placed back into the right distal ureter.  A sensor guidewire was carefully placed beyond the distal ureteral calculus under fluoroscopic guidance and into the right renal pelvis.  Ureteroscopy was then performed alongside the guidewire.  The stone was identified in the distal ureter.  Using the holmium laser fiber, the stone was gently fragmented into multiple pieces.  The fragments were removed with a escape stone basket.  All visible fragments were removed.  There was no evidence of any ureteral injury.  A 6 French by 26 cm double-J stent was then passed over the guidewire into the right renal pelvis.  The tether was removed prior  to stent placement.  Position was confirmed with fluoroscopy.  A sensor guidewire was then passed into the left ureter and up to  the left renal pelvis under fluoroscopic guidance.  A 6 French by 26 cm double-J stent was then passed over the guidewire into the left renal pelvis under cystoscopic and fluoroscopic guidance.  Position was confirmed.  The tether was removed prior to stent placement.  There was efflux of urine from both ureteral stents.  The cystoscope was removed.  A 16 French Foley catheter was placed with 10 mils of sterile water in the balloon.  The patient was then extubated and taken to the post anesthesia care unit in stable condition. ? ?Complications: None ? ?Condition: Stable, extubated, transferred to PACU ? ?Plan:  ?Continue bilateral ureteral stents ?Continue Foley catheter to promote drainage ?Continue antibiotics pending urine culture results ?Follow renal function ?Patient will need outpatient follow-up regarding the left ureteral calculus and possible shockwave lithotripsy for definitive treatment ? ? ?

## 2021-10-14 NOTE — ED Provider Notes (Signed)
?[facility] ?Provider Note ? ? ?CSN: [id number] ?Arrival date & time: 10/14/21  1123 ? ?  ? ?History ? ?Chief Complaint  ?Patient presents with  ? Flank Pain  ? ? ?Charles Brooks is a 71 y.o. male. ? ?Patient complains of left flank pain.  Patient has a history of kidney stones and has coronary artery disease. ? ?The history is provided by the patient and medical records. No language interpreter was used.  ?Flank Pain ?This is a new problem. The current episode started 6 to 12 hours ago. The problem occurs constantly. The problem has not changed since onset.Pertinent negatives include no chest pain, no abdominal pain and no headaches. Nothing aggravates the symptoms. Nothing relieves the symptoms. He has tried nothing for the symptoms.  ? ?  ? ?Home Medications ?Prior to Admission medications   ?Medication Sig Start Date End Date Taking? Authorizing Provider  ?aspirin 81 MG EC tablet Take 1 tablet (81 mg total) by mouth daily with breakfast. 07/07/19  Yes Emokpae, Courage, MD  ?atorvastatin (LIPITOR) 40 MG tablet Take 1 tablet (40 mg total) by mouth daily. 07/29/16  Yes Satira Sark, MD  ?gabapentin (NEURONTIN) 100 MG capsule Take 100 mg by mouth 3 (three) times daily. 09/08/21  Yes [provider]  ?metoprolol tartrate (LOPRESSOR) 25 MG tablet Take 25 mg by mouth 2 (two) times daily.   Yes [provider]  ?nitroGLYCERIN (NITROSTAT) 0.4 MG SL tablet Place 1 tablet (0.4 mg total) under the tongue every 5 (five) minutes x 3 doses as needed for chest pain. 02/26/21  Yes Satira Sark, MD  ?vitamin B-12 (CYANOCOBALAMIN) 1000 MCG tablet Take 1,000 mcg by mouth daily.   Yes [provider]  ?zinc gluconate 50 MG tablet Take 50 mg by mouth daily.   Yes [provider]  ?   ? ?Allergies    ?Codeine   ? ?Review of Systems   ?Review of Systems  ?Constitutional:  Negative for appetite change and fatigue.  ?HENT:  Negative for congestion, ear discharge and sinus  pressure.   ?Eyes:  Negative for discharge.  ?Respiratory:  Negative for cough.   ?Cardiovascular:  Negative for chest pain.  ?Gastrointestinal:  Negative for abdominal pain and diarrhea.  ?Genitourinary:  Positive for flank pain. Negative for frequency and hematuria.  ?Musculoskeletal:  Negative for back pain.  ?Skin:  Negative for rash.  ?Neurological:  Negative for seizures and headaches.  ?Psychiatric/Behavioral:  Negative for hallucinations.   ? ?Physical Exam ?Updated Vital Signs ?BP (!) 100/54   Pulse 86   Temp 99.1 ?F (37.3 ?C) (Oral)   Resp 20   Ht 6' (1.829 m)   Wt 82.6 kg   SpO2 98%   BMI 24.68 kg/m?  ?Physical Exam ?Vitals and nursing note reviewed.  ?Constitutional:   ?   Appearance: He is well-developed.  ?HENT:  ?   Head: Normocephalic.  ?Eyes:  ?   General: No scleral icterus. ?   Conjunctiva/sclera: Conjunctivae normal.  ?Neck:  ?   Thyroid: No thyromegaly.  ?Cardiovascular:  ?   Rate and Rhythm: Normal rate and regular rhythm.  ?   Heart sounds: No murmur heard. ?  No friction rub. No gallop.  ?Pulmonary:  ?   Breath sounds: No stridor. No wheezing or rales.  ?Chest:  ?   Chest wall: No tenderness.  ?Abdominal:  ?   General: There is no distension.  ?   Tenderness: There is abdominal tenderness.  There is no rebound.  ?Musculoskeletal:     ?   General: Normal range of motion.  ?   Cervical back: Neck supple.  ?Lymphadenopathy:  ?   Cervical: No cervical adenopathy.  ?Skin: ?   Findings: No erythema or rash.  ?Neurological:  ?   Mental Status: He is alert and oriented to person, place, and time.  ?   Motor: No abnormal muscle tone.  ?   Coordination: Coordination normal.  ?Psychiatric:     ?   Behavior: Behavior normal.  ? ? ?ED Results / Procedures / Treatments   ?Labs ?(all labs ordered are listed, but only abnormal results are displayed) ?Labs Reviewed  ?CBC WITH DIFFERENTIAL/PLATELET - Abnormal; Notable for the following components:  ?    Result Value  ? WBC 16.4 (*)   ? RBC 4.18 (*)   ?  Neutro Abs 14.4 (*)   ? Monocytes Absolute 1.1 (*)   ? Abs Immature Granulocytes 0.14 (*)   ? All other components within normal limits  ?COMPREHENSIVE METABOLIC PANEL - Abnormal; Notable for the following components:  ? CO2 21 (*)   ? Glucose, Bld 103 (*)   ? BUN 32 (*)   ? Creatinine, Ser 3.35 (*)   ? GFR, Estimated 19 (*)   ? All other components within normal limits  ?CULTURE, BLOOD (ROUTINE X 2)  ?CULTURE, BLOOD (ROUTINE X 2)  ?LACTIC ACID, PLASMA  ?URINALYSIS, ROUTINE W REFLEX MICROSCOPIC  ? ? ?EKG ?None ? ?Radiology ?CT Renal Stone Study ? ?Result Date: 10/14/2021 ?CLINICAL DATA:  Nephrolithiasis EXAM: CT ABDOMEN AND PELVIS WITHOUT CONTRAST TECHNIQUE: Multidetector CT imaging of the abdomen and pelvis was performed following the standard protocol without IV contrast. RADIATION DOSE REDUCTION: This exam was performed according to the departmental dose-optimization program which includes automated exposure control, adjustment of the mA and/or kV according to patient size and/or use of iterative reconstruction technique. COMPARISON:  CT abdomen/pelvis August 09, 2015. FINDINGS: Lower chest: No acute abnormality. Hepatobiliary: No focal liver abnormality is seen. Status post cholecystectomy. No biliary dilatation. Pancreas: Unremarkable. No pancreatic ductal dilatation or surrounding inflammatory changes. Spleen: Normal in size without focal abnormality. Adrenals/Urinary Tract: Unremarkable adrenal glands. Approximately 1 cm obstructing calculus at the right ureterovesical junction (series 2, image 84). Approximately 1.2 cm obstructing calculus at in the mid left ureter (series 5, image 68; series 2, image 65). Resulting moderate left and mild right hydroureteronephrosis. Left perinephric stranding. Additional smaller bilateral nonobstructing renal calculi. Bilateral low-attenuation renal lesions, compatible with cysts. Stomach/Bowel: Stomach is within normal limits. Appendix appears normal. No evidence of bowel  wall thickening, distention, or inflammatory changes. Vascular/Lymphatic: Aortic atherosclerosis. No enlarged abdominal or pelvic lymph nodes. Reproductive: Prostate is unremarkable. Other: No sizable abdominal wall hernia or abnormality. No abdominopelvic ascites. Musculoskeletal: Multilevel degenerative change in the visualized spine. No evidence of acute fracture. IMPRESSION: 1. Obstructing 1.2 cm left mid ureter calculus with moderate left hydroureteronephrosis. 2. Obstructing 1.0 cm right UVJ calculus with mild right hydroureteronephrosis. 3. Additional nonobstructing bilateral renal calculi. 4. Colonic diverticulosis. 5.  Aortic Atherosclerosis (ICD10-I70.0). Electronically Signed   By: Margaretha Sheffield M.D.   On: 10/14/2021 13:37   ? ?Procedures ?Procedures  ? ? ?Medications Ordered in ED ?Medications  ?aspirin EC tablet 81 mg (has no administration in time range)  ?atorvastatin (LIPITOR) tablet 40 mg (has no administration in time range)  ?metoprolol tartrate (LOPRESSOR) tablet 25 mg (has no administration in time range)  ?nitroGLYCERIN (NITROSTAT) SL tablet 0.4  mg (has no administration in time range)  ?vitamin B-12 (CYANOCOBALAMIN) tablet 1,000 mcg (has no administration in time range)  ?gabapentin (NEURONTIN) capsule 100 mg (has no administration in time range)  ?zinc sulfate capsule 220 mg (has no administration in time range)  ?heparin injection 5,000 Units (has no administration in time range)  ?0.9 %  sodium chloride infusion (has no administration in time range)  ?acetaminophen (TYLENOL) tablet 650 mg (has no administration in time range)  ?  Or  ?acetaminophen (TYLENOL) suppository 650 mg (has no administration in time range)  ?oxyCODONE (Oxy IR/ROXICODONE) immediate release tablet 5 mg (has no administration in time range)  ?fentaNYL (SUBLIMAZE) injection 12.5 mcg (has no administration in time range)  ?ondansetron (ZOFRAN) tablet 4 mg (has no administration in time range)  ?  Or  ?ondansetron  (ZOFRAN) injection 4 mg (has no administration in time range)  ?sodium chloride 0.9 % bolus 1,000 mL (0 mLs Intravenous Stopped 10/14/21 1404)  ?ondansetron (ZOFRAN) injection 4 mg (4 mg Intravenous Given 10/14/21 1

## 2021-10-14 NOTE — Consult Note (Signed)
Urology Consult  ? ?Physician requesting consult: Milton Ferguson, MD ? ?Reason for consult: Bilateral ureteral calculi, acute kidney injury ? ?History of Present Illness: Charles Brooks is a 71 y.o. with history of nephrolithiasis presented to the emergency room today with acute onset of left-sided flank pain x24 hours.  He describes the pain as colicky in nature.  He has had associated nausea without emesis.  No dysuria or gross hematuria.  Evaluation with CT imaging showed a 1 cm obstructing calculus at the right UVJ and a 1.2 cm obstructing calculus in the mid left ureter with bilateral hydronephrosis.  His creatinine was elevated to 3.35 from a prior level of 1.66 in 3/22.  WBC elevated at 16.4.  Urinalysis showed 21-50 WBCs, 0-5 RBCs, and many bacteria with negative nitrite. ?He has not had any fever.  He has had chills. ? ?He has a prior history of nephrolithiasis.  He was last treated with right shockwave lithotripsy for a 9 mm right distal ureteral calculus by Dr. Alyson Ingles in April 2022.  He did not return for follow-up after this procedure. ? ? ?Past Medical History:  ?Diagnosis Date  ? Arthritis   ? CAD (coronary artery disease), native coronary artery   ? DES RCA and PTCA RVM 07/2014, LVEF 55%  ? Cellulitis and abscess 08/08/2015  ? CKD (chronic kidney disease) stage 3, GFR 30-59 ml/min (HCC)   ? Essential hypertension   ? History of kidney stones   ? Hyperkalemia 07/06/2019  ? Hyperlipidemia   ? STEMI (ST elevation myocardial infarction) ([place])   ? 07/2014  ? ? ?Past Surgical History:  ?Procedure Laterality Date  ? CHOLECYSTECTOMY    ? COLONOSCOPY N/A 02/09/2021  ? Procedure: COLONOSCOPY;  Surgeon: Aviva Signs, MD;  [facility]: [facility];  Service: Gastroenterology;  Laterality: N/A;  ? COLONOSCOPY WITH PROPOFOL N/A 11/08/2013  ? Procedure: COLONOSCOPY WITH PROPOFOL;  Surgeon: [name], MD;  Location: [facility];  Service: General;  Laterality: N/A;  in cecum at 0741; cecal withdrawal time = 10 min  ?  EXTRACORPOREAL SHOCK WAVE LITHOTRIPSY    ? EXTRACORPOREAL SHOCK WAVE LITHOTRIPSY Right 09/22/2020  ? Procedure: EXTRACORPOREAL SHOCK WAVE LITHOTRIPSY (ESWL);  Surgeon: Cleon Gustin, MD;  Location: [facility];  Service: Urology;  Laterality: Right;  cases not starting until 10:00 due to last minute add ons, need time to get covid tests back  ? INCISION AND DRAINAGE ABSCESS Right 08/12/2015  ? Procedure: INCISION AND DRAINAGE ABSCESS;  Surgeon: Aviva Signs, MD;  Location: [facility];  Service: General;  Laterality: Right;  ? KNEE ARTHROSCOPY Right   ? LEFT HEART CATHETERIZATION WITH CORONARY ANGIOGRAM N/A 08/14/2014  ? Procedure: LEFT HEART CATHETERIZATION WITH CORONARY ANGIOGRAM;  Surgeon: Troy Sine, MD;  LAD 50-60%, CFX OK, OM1 80%(small), RCA 100>>0% w/ 2.25x24 mm Synergy DES, EF 55%  ? POLYPECTOMY N/A 11/08/2013  ? Procedure: POLYPECTOMY;  Surgeon: [name], MD;  Location: [facility];  Service: General;  Laterality: N/A;  cecal polyp  ? POLYPECTOMY  02/09/2021  ? Procedure: POLYPECTOMY INTESTINAL;  Surgeon: Aviva Signs, MD;  Location: [facility];  Service: Gastroenterology;;  ? Removal of kidney stones    ? Open  ? WOUND DEBRIDEMENT Right 10/26/2015  ? Procedure: DEBRIDEMENT WOUND RIGHT FLANK;  Surgeon: Aviva Signs, MD;  Location: [facility];  Service: General;  Laterality: Right;  ? ? ?Medications: ? ?Scheduled Meds: ? acetaminophen  650 mg Oral Q6H  ? Or  ? acetaminophen  650  mg Rectal Q6H  ? [START ON 10/15/2021] aspirin EC  81 mg Oral Q breakfast  ? atorvastatin  40 mg Oral QPM  ? [START ON 10/15/2021] gabapentin  100 mg Oral BID  ? [START ON 10/15/2021] heparin  5,000 Units Subcutaneous Q8H  ? [START ON 10/15/2021] metoprolol tartrate  25 mg Oral BID  ? [START ON 10/15/2021] vitamin B-12  1,000 mcg Oral Daily  ? [START ON 10/15/2021] zinc sulfate  220 mg Oral Daily  ? ?Continuous Infusions: ? sodium chloride 75 mL/hr at 10/14/21 1615  ? [START ON 10/15/2021] cefTRIAXone (ROCEPHIN)  IV    ? lactated ringers     ? ?PRN Meds:.fentaNYL (SUBLIMAZE) injection, nitroGLYCERIN, ondansetron **OR** ondansetron (ZOFRAN) IV, oxyCODONE ? ?Allergies:  ?Allergies  ?Allergen Reactions  ? Codeine Hives  ? ? ?Family History  ?Problem Relation Age of Onset  ? Heart attack Brother   ? ? ?Social History:  reports that he quit smoking about 17 years ago. His smoking use included cigars. He has never used smokeless tobacco. He reports that he does not drink alcohol and does not use drugs. ? ?ROS: ?A complete review of systems was performed.  All systems are negative except for pertinent findings as noted. ? ?Physical Exam:  ?Temp:  [98.3 ?F (36.8 ?C)-99.1 ?F (37.3 ?C)] 98.3 ?F (36.8 ?C) (04/27 1549) ?Pulse Rate:  [70-86] 70 (04/27 1549) ?Resp:  [16-26] 20 (04/27 1549) ?BP: (77-116)/(44-64) 116/55 (04/27 1549) ?SpO2:  [97 %-100 %] 100 % (04/27 1549) ?Weight:  [82.6 kg-86 kg] 86 kg (04/27 1549) ?GENERAL APPEARANCE:  Well appearing, well developed, well nourished, NAD ?HEENT: Atraumatic, Normocephalic, oropharynx clear. ?NECK: Supple without lymphadenopathy or thyromegaly. ?LUNGS: Clear to auscultation bilaterally. ?HEART: Regular Rate and Rhythm without murmurs, gallops, or rubs. ?ABDOMEN: Soft, non-tender, No Masses. ?EXTREMITIES: Moves all extremities well.  Without clubbing, cyanosis, or edema. ?NEUROLOGIC:  Alert and oriented x 3, normal gait, CN II-XII grossly intact.  ?MENTAL STATUS:  Appropriate. ?BACK:  Non-tender to palpation.  No CVAT ?SKIN:  Warm, dry and intact.    ? ?Laboratory Data:  ?Recent Labs  ?  10/14/21 ?0938  ?WBC 16.4*  ?HGB 13.2  ?HCT 40.6  ?PLT 165  ? ? ?Recent Labs  ?  10/14/21 ?1224  ?NA 136  ?K 4.8  ?CL 108  ?GLUCOSE 103*  ?BUN 32*  ?CALCIUM 9.2  ?CREATININE 3.35*  ? ? ? ?Results for orders placed or performed during the hospital encounter of 10/14/21 (from the past 24 hour(s))  ?Urinalysis, Routine w reflex microscopic Urine, Catheterized     Status: Abnormal  ? Collection Time: 10/14/21 11:55 AM  ?Result Value Ref  Range  ? Color, Urine YELLOW YELLOW  ? APPearance CLEAR CLEAR  ? Specific Gravity, Urine 1.012 1.005 - 1.030  ? pH 5.0 5.0 - 8.0  ? Glucose, UA NEGATIVE NEGATIVE mg/dL  ? Hgb urine dipstick MODERATE (A) NEGATIVE  ? Bilirubin Urine NEGATIVE NEGATIVE  ? Ketones, ur NEGATIVE NEGATIVE mg/dL  ? Protein, ur 30 (A) NEGATIVE mg/dL  ? Nitrite NEGATIVE NEGATIVE  ? Leukocytes,Ua TRACE (A) NEGATIVE  ? RBC / HPF 0-5 0 - 5 RBC/hpf  ? WBC, UA 21-50 0 - 5 WBC/hpf  ? Bacteria, UA MANY (A) NONE SEEN  ? Squamous Epithelial / LPF 0-5 0 - 5  ?CBC with Differential     Status: Abnormal  ? Collection Time: 10/14/21 12:24 PM  ?Result Value Ref Range  ? WBC 16.4 (H) 4.0 - 10.5 K/uL  ?  RBC 4.18 (L) 4.22 - 5.81 MIL/uL  ? Hemoglobin 13.2 13.0 - 17.0 g/dL  ? HCT 40.6 39.0 - 52.0 %  ? MCV 97.1 80.0 - 100.0 fL  ? MCH 31.6 26.0 - 34.0 pg  ? MCHC 32.5 30.0 - 36.0 g/dL  ? RDW 13.9 11.5 - 15.5 %  ? Platelets 165 150 - 400 K/uL  ? nRBC 0.0 0.0 - 0.2 %  ? Neutrophils Relative % 87 %  ? Neutro Abs 14.4 (H) 1.7 - 7.7 K/uL  ? Lymphocytes Relative 5 %  ? Lymphs Abs 0.8 0.7 - 4.0 K/uL  ? Monocytes Relative 7 %  ? Monocytes Absolute 1.1 (H) 0.1 - 1.0 K/uL  ? Eosinophils Relative 0 %  ? Eosinophils Absolute 0.0 0.0 - 0.5 K/uL  ? Basophils Relative 0 %  ? Basophils Absolute 0.0 0.0 - 0.1 K/uL  ? Immature Granulocytes 1 %  ? Abs Immature Granulocytes 0.14 (H) 0.00 - 0.07 K/uL  ?Comprehensive metabolic panel     Status: Abnormal  ? Collection Time: 10/14/21 12:24 PM  ?Result Value Ref Range  ? Sodium 136 135 - 145 mmol/L  ? Potassium 4.8 3.5 - 5.1 mmol/L  ? Chloride 108 98 - 111 mmol/L  ? CO2 21 (L) 22 - 32 mmol/L  ? Glucose, Bld 103 (H) 70 - 99 mg/dL  ? BUN 32 (H) 8 - 23 mg/dL  ? Creatinine, Ser 3.35 (H) 0.61 - 1.24 mg/dL  ? Calcium 9.2 8.9 - 10.3 mg/dL  ? Total Protein 7.3 6.5 - 8.1 g/dL  ? Albumin 4.0 3.5 - 5.0 g/dL  ? AST 16 15 - 41 U/L  ? ALT 21 0 - 44 U/L  ? Alkaline Phosphatase 60 38 - 126 U/L  ? Total Bilirubin 0.9 0.3 - 1.2 mg/dL  ? GFR, Estimated 19  (L) >60 mL/min  ? Anion gap 7 5 - 15  ?Lactic acid, plasma     Status: None  ? Collection Time: 10/14/21 12:25 PM  ?Result Value Ref Range  ? Lactic Acid, Venous 1.4 0.5 - 1.9 mmol/L  ?Culture, blood (Routine X 2)

## 2021-10-14 NOTE — Assessment & Plan Note (Signed)
--   Pt reports has been stable  ?

## 2021-10-14 NOTE — Assessment & Plan Note (Addendum)
--   acute kidney injury thought obstructive  ?-- urological consultation for operative management  ?-- pt is now POD #1 s/p bilateral ureteral stent placement ?-- follow up with Dr. Felipa Eth in 1 week ?-- follow up with PCP in 3-5 days ?-- follow up with nephrologist in 1-2 weeks ?

## 2021-10-15 DIAGNOSIS — E785 Hyperlipidemia, unspecified: Secondary | ICD-10-CM

## 2021-10-15 DIAGNOSIS — N179 Acute kidney failure, unspecified: Secondary | ICD-10-CM | POA: Diagnosis not present

## 2021-10-15 DIAGNOSIS — R739 Hyperglycemia, unspecified: Secondary | ICD-10-CM | POA: Diagnosis not present

## 2021-10-15 DIAGNOSIS — N201 Calculus of ureter: Secondary | ICD-10-CM | POA: Diagnosis present

## 2021-10-15 DIAGNOSIS — R109 Unspecified abdominal pain: Secondary | ICD-10-CM | POA: Diagnosis not present

## 2021-10-15 DIAGNOSIS — I2510 Atherosclerotic heart disease of native coronary artery without angina pectoris: Secondary | ICD-10-CM | POA: Diagnosis not present

## 2021-10-15 DIAGNOSIS — Z87891 Personal history of nicotine dependence: Secondary | ICD-10-CM | POA: Diagnosis not present

## 2021-10-15 DIAGNOSIS — N132 Hydronephrosis with renal and ureteral calculous obstruction: Secondary | ICD-10-CM | POA: Diagnosis not present

## 2021-10-15 DIAGNOSIS — N1832 Chronic kidney disease, stage 3b: Secondary | ICD-10-CM | POA: Diagnosis not present

## 2021-10-15 LAB — BASIC METABOLIC PANEL
Anion gap: 4 — ABNORMAL LOW (ref 5–15)
BUN: 36 mg/dL — ABNORMAL HIGH (ref 8–23)
CO2: 19 mmol/L — ABNORMAL LOW (ref 22–32)
Calcium: 7.8 mg/dL — ABNORMAL LOW (ref 8.9–10.3)
Chloride: 113 mmol/L — ABNORMAL HIGH (ref 98–111)
Creatinine, Ser: 2.98 mg/dL — ABNORMAL HIGH (ref 0.61–1.24)
GFR, Estimated: 22 mL/min — ABNORMAL LOW (ref >60)
Glucose, Bld: 136 mg/dL — ABNORMAL HIGH (ref 70–99)
Potassium: 5.6 mmol/L — ABNORMAL HIGH (ref 3.5–5.1)
Sodium: 136 mmol/L (ref 135–145)

## 2021-10-15 LAB — CBC WITH DIFFERENTIAL/PLATELET
Abs Immature Granulocytes: 0.11 10*3/uL — ABNORMAL HIGH (ref 0.00–0.07)
Basophils Absolute: 0 10*3/uL (ref 0.0–0.1)
Basophils Relative: 0 %
Eosinophils Absolute: 0 10*3/uL (ref 0.0–0.5)
Eosinophils Relative: 0 %
HCT: 32.6 % — ABNORMAL LOW (ref 39.0–52.0)
Hemoglobin: 10 g/dL — ABNORMAL LOW (ref 13.0–17.0)
Immature Granulocytes: 1 %
Lymphocytes Relative: 4 %
Lymphs Abs: 0.6 10*3/uL — ABNORMAL LOW (ref 0.7–4.0)
MCH: 30.9 pg (ref 26.0–34.0)
MCHC: 30.7 g/dL (ref 30.0–36.0)
MCV: 100.6 fL — ABNORMAL HIGH (ref 80.0–100.0)
Monocytes Absolute: 0.5 10*3/uL (ref 0.1–1.0)
Monocytes Relative: 3 %
Neutro Abs: 12.4 10*3/uL — ABNORMAL HIGH (ref 1.7–7.7)
Neutrophils Relative %: 92 %
Platelets: 131 10*3/uL — ABNORMAL LOW (ref 150–400)
RBC: 3.24 MIL/uL — ABNORMAL LOW (ref 4.22–5.81)
RDW: 14 % (ref 11.5–15.5)
WBC: 13.6 10*3/uL — ABNORMAL HIGH (ref 4.0–10.5)
nRBC: 0 % (ref 0.0–0.2)

## 2021-10-15 LAB — MAGNESIUM: Magnesium: 1.8 mg/dL (ref 1.7–2.4)

## 2021-10-15 MED ORDER — SODIUM ZIRCONIUM CYCLOSILICATE 10 G PO PACK
10.0000 g | PACK | Freq: Three times a day (TID) | ORAL | Status: DC
  Administered 2021-10-15: 10 g via ORAL
  Filled 2021-10-15: qty 1

## 2021-10-15 MED ORDER — LOKELMA 10 G PO PACK
10.0000 g | PACK | Freq: Two times a day (BID) | ORAL | 0 refills | Status: AC
Start: 2021-10-15 — End: 2021-10-18

## 2021-10-15 MED ORDER — MAGNESIUM SULFATE 2 GM/50ML IV SOLN
2.0000 g | Freq: Once | INTRAVENOUS | Status: AC
  Administered 2021-10-15: 2 g via INTRAVENOUS
  Filled 2021-10-15: qty 50

## 2021-10-15 MED ORDER — FUROSEMIDE 10 MG/ML IJ SOLN
20.0000 mg | Freq: Once | INTRAMUSCULAR | Status: AC
  Administered 2021-10-15: 20 mg via INTRAVENOUS
  Filled 2021-10-15: qty 2

## 2021-10-15 MED ORDER — GABAPENTIN 100 MG PO CAPS: 100.0000 mg | ORAL_CAPSULE | Freq: Every day | ORAL | Status: AC

## 2021-10-15 MED ORDER — LEVOFLOXACIN 250 MG PO TABS: 250.0000 mg | ORAL_TABLET | Freq: Every day | ORAL | 0 refills | Status: AC

## 2021-10-15 NOTE — Progress Notes (Signed)
This writer removed foley cath per order with no issues, pt tolerated well. Pt has since put out 400 cc's with urinal at bedside. No abdominal discomfort or distention. Pt is set to d/c home today. ?

## 2021-10-15 NOTE — Discharge Summary (Addendum)
Physician Discharge Summary  Charles Brooks FAO:[id number] DOB: [birth year] DOA: 10/14/2021  PCP: [name], MD Urologist: Dr. Pete Glatter   Admit date: 10/14/2021 Discharge date: 10/15/2021  Admitted From:  Home  Disposition: Home   Recommendations for Outpatient Follow-up:  Follow up with Dr. Pete Glatter in 1 weeks Follow up with PCP in 3-5 days for labs  Follow up with your nephrologist in 2 weeks  Please obtain BMP/CBC in 3-5 days for recheck of electrolytes Please follow up on the following pending results: final urine culture results   Discharge Condition: STABLE   CODE STATUS: FULL  DIET: renal recommended   Brief Hospitalization Summary: Please see all hospital notes, images, labs for full details of the hospitalization. 71 year old gentleman with long history of nephrolithiasis, coronary artery disease, hyperlipidemia, stage III CKD, history of STEMI, hyperkalemia, hypertension, under the urological care of Dr. Ronne Binning reported that he had lithotripsy done last year.  He had been doing fairly well until around 8 PM last night during a church service he started having severe left flank pain that persisted for most of the night.  He describes persistent intermittent waves of severe left flank pain associated with ongoing malaise.  He has had occasional bouts of nausea.  No emesis.  No chest pain and no shortness of breath.  He denies gross hematuria.  He had no difficulty urinating prior to arrival however has not been able to give a urine sample since arrival.  He was given IV fluids.  He was noted to have an elevated WBC.  He has been afebrile.  His blood pressures were soft initially.  He had a CT renal study done with findings of obstructive stones involving both kidneys.  Dr. Pete Glatter with urology was consulted and recommended that patient be admitted and planning to see the patient later today.  Hospitalist was consulted for admission.  HOSPITAL COURSE BY PROBLEM LIST    Assessment and Plan: * Hydronephrosis concurrent with and due to calculi of kidney and ureter -- urology consultation in ED, Dr. Pete Glatter treated patient and now has bilateral ureteral stents in place   Hyperglycemia -- suspect reactive, monitor  -- he will need outpatient follow up with PCP for ongoing surveillance of this   Left flank pain -- resolved now after cystoscopy, right ureteroscopic laser lithotripsy and bilateral stent placement   -- pt seen by urology this morning and can discharge home on oral antibiotics, foley can be discontinued   Leukocytosis -- secondary to acute obstruction caused by kidney stone --  blood culture x 2 no growth to date  -- treated with high dose ceftriaxone IV -- WBC trending down -- discharge home on oral antibiotics per Dr. Pete Glatter   Nephrolithiasis -- urology consultation requested -- pt taken to OR on 4/27 for cystoscopy, right ureteroscopic laser lithotripsy, and insertion of bilateral stents -- he is feeling better and doing well postoperative.  -- he was cleared to discharge home by urologist -- he will need to discharge home on oral antibiotics   Hyperkalemia -- treated in hospital with IV lasix and lokelma -- renal function improving and diuresing well, DC foley -- follow up with PCP in 3 days for repeat labs   AKI (acute kidney injury) on CKD stage 3a  -- Obstructive and possibly some prerenal causes -- follow up urology recommendations -- renal function is improving after bilateral ureteral stone placement  -- pt has been urinating with no difficulty  CKD (chronic kidney disease) stage 3,  GFR 30-59 ml/min (HCC) -- acute kidney injury thought obstructive  -- urological consultation for operative management  -- pt is now POD #1 s/p bilateral ureteral stent placement -- follow up with Dr. Pete Glatter in 1 week -- follow up with PCP in 3-5 days -- follow up with nephrologist in 1-2 weeks  Dyslipidemia - low HDL -- resume  home atorvastatin   Hypertriglyceridemia -- Pt reports has been stable   Former tobacco use -- stable   CAD (coronary artery disease), native coronary artery -- resume home cardiac medications   Discharge Diagnoses:  Principal Problem:   Hydronephrosis concurrent with and due to calculi of kidney and ureter Active Problems:   CAD (coronary artery disease), native coronary artery   Former tobacco use   Hypertriglyceridemia   Dyslipidemia - low HDL   CKD (chronic kidney disease) stage 3, GFR 30-59 ml/min (HCC)   AKI (acute kidney injury) on CKD stage 3a    Hyperkalemia   Nephrolithiasis   Diverticulosis   Leukocytosis   Left flank pain   Hyperglycemia   Ureterolithiasis   Discharge Instructions: Discharge Instructions     Ambulatory referral to Nephrology   Complete by: As directed       Allergies as of 10/15/2021       Reactions   Codeine Hives        Medication List     TAKE these medications    aspirin 81 MG EC tablet Take 1 tablet (81 mg total) by mouth daily with breakfast.   atorvastatin 40 MG tablet Commonly known as: LIPITOR Take 1 tablet (40 mg total) by mouth daily.   gabapentin 100 MG capsule Commonly known as: NEURONTIN Take 1 capsule (100 mg total) by mouth at bedtime. What changed: when to take this   levofloxacin 250 MG tablet Commonly known as: Levaquin Take 1 tablet (250 mg total) by mouth daily for 10 days.   Lokelma 10 g Pack packet Generic drug: sodium zirconium cyclosilicate Take 10 g by mouth 2 (two) times daily for 3 days.   metoprolol tartrate 25 MG tablet Commonly known as: LOPRESSOR Take 25 mg by mouth 2 (two) times daily.   nitroGLYCERIN 0.4 MG SL tablet Commonly known as: NITROSTAT Place 1 tablet (0.4 mg total) under the tongue every 5 (five) minutes x 3 doses as needed for chest pain.   vitamin B-12 1000 MCG tablet Commonly known as: CYANOCOBALAMIN Take 1,000 mcg by mouth daily.   zinc gluconate 50 MG  tablet Take 50 mg by mouth daily.        Follow-up Information     Stoneking, Danford Bad., MD. Schedule an appointment as soon as possible for a visit in 1 week(s).   Specialty: Urology Why: Hospital Follow Up Contact information: [address] [place] [facility] [address] [phone]         [name], MD. Schedule an appointment as soon as possible for a visit in 3 day(s).   Specialty: Internal Medicine Why: Hospital Follow Up, check labs Contact information: [address] [place] [address] [phone]                Allergies  Allergen Reactions   Codeine Hives   Allergies as of 10/15/2021       Reactions   Codeine Hives        Medication List     TAKE these medications    aspirin 81 MG EC tablet Take 1 tablet (81 mg total)  by mouth daily with breakfast.   atorvastatin 40 MG tablet Commonly known as: LIPITOR Take 1 tablet (40 mg total) by mouth daily.   gabapentin 100 MG capsule Commonly known as: NEURONTIN Take 1 capsule (100 mg total) by mouth at bedtime. What changed: when to take this   levofloxacin 250 MG tablet Commonly known as: Levaquin Take 1 tablet (250 mg total) by mouth daily for 10 days.   Lokelma 10 g Pack packet Generic drug: sodium zirconium cyclosilicate Take 10 g by mouth 2 (two) times daily for 3 days.   metoprolol tartrate 25 MG tablet Commonly known as: LOPRESSOR Take 25 mg by mouth 2 (two) times daily.   nitroGLYCERIN 0.4 MG SL tablet Commonly known as: NITROSTAT Place 1 tablet (0.4 mg total) under the tongue every 5 (five) minutes x 3 doses as needed for chest pain.   vitamin B-12 1000 MCG tablet Commonly known as: CYANOCOBALAMIN Take 1,000 mcg by mouth daily.   zinc gluconate 50 MG tablet Take 50 mg by mouth daily.        Procedures/Studies: DG C-Arm 1-60 Min-No Report  Result Date: 10/14/2021 Fluoroscopy was utilized by the requesting physician.  No radiographic interpretation.   DG  C-Arm 1-60 Min-No Report  Result Date: 10/14/2021 Fluoroscopy was utilized by the requesting physician.  No radiographic interpretation.   CT Renal Stone Study  Result Date: 10/14/2021 CLINICAL DATA:  Nephrolithiasis EXAM: CT ABDOMEN AND PELVIS WITHOUT CONTRAST TECHNIQUE: Multidetector CT imaging of the abdomen and pelvis was performed following the standard protocol without IV contrast. RADIATION DOSE REDUCTION: This exam was performed according to the departmental dose-optimization program which includes automated exposure control, adjustment of the mA and/or kV according to patient size and/or use of iterative reconstruction technique. COMPARISON:  CT abdomen/pelvis August 09, 2015. FINDINGS: Lower chest: No acute abnormality. Hepatobiliary: No focal liver abnormality is seen. Status post cholecystectomy. No biliary dilatation. Pancreas: Unremarkable. No pancreatic ductal dilatation or surrounding inflammatory changes. Spleen: Normal in size without focal abnormality. Adrenals/Urinary Tract: Unremarkable adrenal glands. Approximately 1 cm obstructing calculus at the right ureterovesical junction (series 2, image 84). Approximately 1.2 cm obstructing calculus at in the mid left ureter (series 5, image 68; series 2, image 65). Resulting moderate left and mild right hydroureteronephrosis. Left perinephric stranding. Additional smaller bilateral nonobstructing renal calculi. Bilateral low-attenuation renal lesions, compatible with cysts. Stomach/Bowel: Stomach is within normal limits. Appendix appears normal. No evidence of bowel wall thickening, distention, or inflammatory changes. Vascular/Lymphatic: Aortic atherosclerosis. No enlarged abdominal or pelvic lymph nodes. Reproductive: Prostate is unremarkable. Other: No sizable abdominal wall hernia or abnormality. No abdominopelvic ascites. Musculoskeletal: Multilevel degenerative change in the visualized spine. No evidence of acute fracture. IMPRESSION: 1.  Obstructing 1.2 cm left mid ureter calculus with moderate left hydroureteronephrosis. 2. Obstructing 1.0 cm right UVJ calculus with mild right hydroureteronephrosis. 3. Additional nonobstructing bilateral renal calculi. 4. Colonic diverticulosis. 5.  Aortic Atherosclerosis (ICD10-I70.0). Electronically Signed   By: Feliberto Harts M.D.   On: 10/14/2021 13:37     Subjective: Pt reports that he feels well and he wants to go home.  He is urinating without any difficulty.  No more flank pain.  He has seen Dr. Pete Glatter and will see him next week in office.   Discharge Exam: Vitals:   10/15/21 0303 10/15/21 0557  BP: 97/62 106/66  Pulse: 68 73  Resp:    Temp: 97.8 F (36.6 C) 98.1 F (36.7 C)  SpO2: 100% 99%   Vitals:  10/14/21 2030 10/14/21 2100 10/15/21 0303 10/15/21 0557  BP: 117/60 (!) 111/57 97/62 106/66  Pulse: 85 68 68 73  Resp: (!) 22 20    Temp:  98.2 F (36.8 C) 97.8 F (36.6 C) 98.1 F (36.7 C)  TempSrc:      SpO2: 98% 98% 100% 99%  Weight:      Height:       General: Pt is alert, awake, not in acute distress Cardiovascular: normal S1/S2 +, no rubs, no gallops Respiratory: CTA bilaterally, no wheezing, no rhonchi Abdominal: Soft, NT, ND, bowel sounds + Extremities: no edema, no cyanosis   The results of significant diagnostics from this hospitalization (including imaging, microbiology, ancillary and laboratory) are listed below for reference.     Microbiology: Recent Results (from the past 240 hour(s))  Culture, blood (Routine X 2) w Reflex to ID Panel     Status: None (Preliminary result)   Collection Time: 10/14/21  2:24 PM   Specimen: BLOOD  Result Value Ref Range Status   Specimen Description BLOOD BLOOD LEFT WRIST  Final   Special Requests   Final    BOTTLES DRAWN AEROBIC AND ANAEROBIC Blood Culture adequate volume   Culture   Final    NO GROWTH < 24 HOURS Performed at [facility], [address], [place], [place] [address]    Report Status PENDING   Incomplete  Culture, blood (Routine X 2) w Reflex to ID Panel     Status: None (Preliminary result)   Collection Time: 10/14/21  2:25 PM   Specimen: BLOOD  Result Value Ref Range Status   Specimen Description BLOOD LEFT ANTECUBITAL  Final   Special Requests   Final    BOTTLES DRAWN AEROBIC AND ANAEROBIC Blood Culture adequate volume   Culture   Final    NO GROWTH < 24 HOURS Performed at [facility], [address], [place], [place] [address]    Report Status PENDING  Incomplete     Labs: BNP (last 3 results) No results for input(s): BNP in the last 8760 hours. Basic Metabolic Panel: Recent Labs  Lab 10/14/21 1224 10/15/21 0516  NA 136 136  K 4.8 5.6*  CL 108 113*  CO2 21* 19*  GLUCOSE 103* 136*  BUN 32* 36*  CREATININE 3.35* 2.98*  CALCIUM 9.2 7.8*  MG  --  1.8   Liver Function Tests: Recent Labs  Lab 10/14/21 1224  AST 16  ALT 21  ALKPHOS 60  BILITOT 0.9  PROT 7.3  ALBUMIN 4.0   No results for input(s): LIPASE, AMYLASE in the last 168 hours. No results for input(s): AMMONIA in the last 168 hours. CBC: Recent Labs  Lab 10/14/21 1224 10/15/21 0516  WBC 16.4* 13.6*  NEUTROABS 14.4* 12.4*  HGB 13.2 10.0*  HCT 40.6 32.6*  MCV 97.1 100.6*  PLT 165 131*   Cardiac Enzymes: No results for input(s): CKTOTAL, CKMB, CKMBINDEX, TROPONINI in the last 168 hours. BNP: Invalid input(s): POCBNP CBG: No results for input(s): GLUCAP in the last 168 hours. D-Dimer No results for input(s): DDIMER in the last 72 hours. Hgb A1c No results for input(s): HGBA1C in the last 72 hours. Lipid Profile No results for input(s): CHOL, HDL, LDLCALC, TRIG, CHOLHDL, LDLDIRECT in the last 72 hours. Thyroid function studies No results for input(s): TSH, T4TOTAL, T3FREE, THYROIDAB in the last 72 hours.  Invalid input(s): FREET3 Anemia work up No results for input(s): VITAMINB12, FOLATE, FERRITIN, TIBC, IRON, RETICCTPCT in the last 72 hours. Urinalysis  Component Value  Date/Time   COLORURINE YELLOW 10/14/2021 1155   APPEARANCEUR CLEAR 10/14/2021 1155   APPEARANCEUR Clear 09/18/2020 1156   LABSPEC 1.012 10/14/2021 1155   PHURINE 5.0 10/14/2021 1155   GLUCOSEU NEGATIVE 10/14/2021 1155   HGBUR MODERATE (A) 10/14/2021 1155   BILIRUBINUR NEGATIVE 10/14/2021 1155   BILIRUBINUR Negative 09/18/2020 1156   KETONESUR NEGATIVE 10/14/2021 1155   PROTEINUR 30 (A) 10/14/2021 1155   NITRITE NEGATIVE 10/14/2021 1155   LEUKOCYTESUR TRACE (A) 10/14/2021 1155   Sepsis Labs Invalid input(s): PROCALCITONIN,  WBC,  LACTICIDVEN Microbiology Recent Results (from the past 240 hour(s))  Culture, blood (Routine X 2) w Reflex to ID Panel     Status: None (Preliminary result)   Collection Time: 10/14/21  2:24 PM   Specimen: BLOOD  Result Value Ref Range Status   Specimen Description BLOOD BLOOD LEFT WRIST  Final   Special Requests   Final    BOTTLES DRAWN AEROBIC AND ANAEROBIC Blood Culture adequate volume   Culture   Final    NO GROWTH < 24 HOURS Performed at [facility], [address], [place], [place] [address]    Report Status PENDING  Incomplete  Culture, blood (Routine X 2) w Reflex to ID Panel     Status: None (Preliminary result)   Collection Time: 10/14/21  2:25 PM   Specimen: BLOOD  Result Value Ref Range Status   Specimen Description BLOOD LEFT ANTECUBITAL  Final   Special Requests   Final    BOTTLES DRAWN AEROBIC AND ANAEROBIC Blood Culture adequate volume   Culture   Final    NO GROWTH < 24 HOURS Performed at [facility], [address], [place], [place] [address]    Report Status PENDING  Incomplete    Time coordinating discharge: 37 mins  SIGNED:  Standley Dakins, MD  [facility] 10/15/2021, 11:41 AM How to contact the [facility] Attending or Consulting provider 7A - 7P or covering provider during after hours 7P -7A, for this patient?  Check the care team in [facility] and look for a) attending/consulting [facility] provider listed and b) the [facility] team  listed Log into [email or web address] and use [place]'s universal password to access. If you do not have the password, please contact the hospital operator. Locate the [facility] provider you are looking for under [facility] and page to a number that you can be directly reached. If you still have difficulty reaching the provider, please page the [facility] (Director on Call) for the Hospitalists listed on amion for assistance.

## 2021-10-15 NOTE — Discharge Instructions (Signed)
PLEASE HAVE LABS CHECKED IN 3-5 DAYS.  ?PLEASE FOLLOW UP WITH PCP IN 3-5 DAYS.  ?PLEASE FOLLOW UP WITH UROLOGIST IN 1 WEEK.   ? ? ?IMPORTANT INFORMATION: PAY CLOSE ATTENTION  ? ?PHYSICIAN DISCHARGE INSTRUCTIONS ? ?Follow with Primary care provider  [name], MD  and other consultants as instructed by your Hospitalist Physician ? ?SEEK MEDICAL CARE OR RETURN TO EMERGENCY ROOM IF SYMPTOMS COME BACK, WORSEN OR NEW PROBLEM DEVELOPS  ? ?Please note: ?You were cared for by a hospitalist during your hospital stay. Every effort will be made to forward records to your primary care provider.  You can request that your primary care provider send for your hospital records if they have not received them.  Once you are discharged, your primary care physician will handle any further medical issues. Please note that NO REFILLS for any discharge medications will be authorized once you are discharged, as it is imperative that you return to your primary care physician (or establish a relationship with a primary care physician if you do not have one) for your post hospital discharge needs so that they can reassess your need for medications and monitor your lab values. ? ?Please get a complete blood count and chemistry panel checked by your Primary MD at your next visit, and again as instructed by your Primary MD. ? ?Get Medicines reviewed and adjusted: ?Please take all your medications with you for your next visit with your Primary MD ? ?Laboratory/radiological data: ?Please request your Primary MD to go over all hospital tests and procedure/radiological results at the follow up, please ask your primary care provider to get all Hospital records sent to his/her office. ? ?In some cases, they will be blood work, cultures and biopsy results pending at the time of your discharge. Please request that your primary care provider follow up on these results. ? ?If you are diabetic, please bring your blood sugar readings with you to your  follow up appointment with primary care.   ? ?Please call and make your follow up appointments as soon as possible.   ? ?Also Note the following: ?If you experience worsening of your admission symptoms, develop shortness of breath, life threatening emergency, suicidal or homicidal thoughts you must seek medical attention immediately by calling 911 or calling your MD immediately  if symptoms less severe. ? ?You must read complete instructions/literature along with all the possible adverse reactions/side effects for all the Medicines you take and that have been prescribed to you. Take any new Medicines after you have completely understood and accpet all the possible adverse reactions/side effects.  ? ?Do not drive when taking Pain medications or sleeping medications (Benzodiazepines) ? ?Do not take more than prescribed Pain, Sleep and Anxiety Medications. It is not advisable to combine anxiety,sleep and pain medications without talking with your primary care practitioner ? ?Special Instructions: If you have smoked or chewed Tobacco  in the last 2 yrs please stop smoking, stop any regular Alcohol  and or any Recreational drug use. ? ?Wear Seat belts while driving.  Do not drive if taking any narcotic, mind altering or controlled substances or recreational drugs or alcohol.  ? ? ? ? ? ?

## 2021-10-15 NOTE — Care Management Obs Status (Signed)
MEDICARE OBSERVATION STATUS NOTIFICATION ? ? ?Patient Details  ?Name: Charles Brooks ?MRN: [id number] ?Date of Birth: [birth year] ? ? ?Medicare Observation Status Notification Given:  Yes ? ? ? ?Boneta Lucks, RN ?10/15/2021, 11:18 AM ?

## 2021-10-15 NOTE — Care Management Important Message (Signed)
Important Message ? ?Patient Details  ?Name: Charles Brooks ?MRN: [id number] ?Date of Birth: 08-18-1950 ? ? ?Medicare Important Message Given:  Yes ? ? ? ? ?Tommy Medal ?10/15/2021, 1:30 PM ?

## 2021-10-15 NOTE — Care Management CC44 (Signed)
Condition Code 44 Documentation Completed ? ?Patient Details  ?Name: Charles Brooks ?MRN: [id number] ?Date of Birth: 02-27-1951 ? ? ?Condition Code 44 given:  Yes ?Patient signature on Condition Code 44 notice:  Yes ?Documentation of 2 MD's agreement:  Yes ?Code 44 added to claim:  Yes ? ? ? ?Boneta Lucks, RN ?10/15/2021, 11:18 AM ? ?

## 2021-10-15 NOTE — Assessment & Plan Note (Signed)
--   treated in hospital with IV lasix and lokelma ?-- renal function improving and diuresing well, DC foley ?-- follow up with PCP in 3 days for repeat labs ? ?

## 2021-10-15 NOTE — Progress Notes (Signed)
Urology Inpatient Progress Note ? ?Subjective: ?Postop day 1 status post cystoscopy, right ureteroscopic laser lithotripsy, insertion of bilateral ureteral stents.  He is doing well.  No flank pain.  No fevers or chills.  Good urine output. ?Renal function improving. ? ?Anti-infectives: ?Anti-infectives (From admission, onward)  ? ? Start     Dose/Rate Route Frequency Ordered Stop  ? 10/15/21 1400  cefTRIAXone (ROCEPHIN) 2 g in sodium chloride 0.9 % 100 mL IVPB       ? 2 g ?200 mL/hr over 30 Minutes Intravenous Every 24 hours 10/14/21 1454    ? 10/14/21 1400  cefTRIAXone (ROCEPHIN) 2 g in sodium chloride 0.9 % 100 mL IVPB       ? 2 g ?200 mL/hr over 30 Minutes Intravenous  Once 10/14/21 1347 10/14/21 1506  ? ?  ? ? ?Current Facility-Administered Medications  ?Medication Dose Route Frequency Provider Last Rate Last Admin  ? 0.9 %  sodium chloride infusion   Intravenous Continuous Primus Bravo., MD 75 mL/hr at 10/14/21 2338 New Bag at 10/14/21 2338  ? acetaminophen (TYLENOL) tablet 650 mg  650 mg Oral Q6H Darsha Zumstein, Reece Leader., MD   650 mg at 10/15/21 0223  ? Or  ? acetaminophen (TYLENOL) suppository 650 mg  650 mg Rectal Q6H Garnett Rekowski, Reece Leader., MD      ? aspirin EC tablet 81 mg  81 mg Oral Q breakfast Elanda Garmany, Reece Leader., MD      ? atorvastatin (LIPITOR) tablet 40 mg  40 mg Oral QPM Tatiyanna Lashley, Reece Leader., MD      ? cefTRIAXone (ROCEPHIN) 2 g in sodium chloride 0.9 % 100 mL IVPB  2 g Intravenous Q24H Kydan Shanholtzer, Reece Leader., MD      ? fentaNYL (SUBLIMAZE) injection 12.5 mcg  12.5 mcg Intravenous Q2H PRN Viktoria Gruetzmacher, Reece Leader., MD      ? furosemide (LASIX) injection 20 mg  20 mg Intravenous Once Johnson, Clanford L, MD      ? gabapentin (NEURONTIN) capsule 100 mg  100 mg Oral BID Jayleon Mcfarlane, Reece Leader., MD      ? heparin injection 5,000 Units  5,000 Units Subcutaneous Q8H Jaine Estabrooks, Reece Leader., MD   5,000 Units at 10/15/21 0607  ? magnesium sulfate IVPB 2 g 50 mL  2 g Intravenous Once Johnson, Clanford L, MD       ? metoprolol tartrate (LOPRESSOR) tablet 25 mg  25 mg Oral BID Allah Reason, Reece Leader., MD      ? nitroGLYCERIN (NITROSTAT) SL tablet 0.4 mg  0.4 mg Sublingual Q5 Min x 3 PRN Rahsaan Weakland, Reece Leader., MD      ? ondansetron [facility]) tablet 4 mg  4 mg Oral Q6H PRN Trinisha Paget, Reece Leader., MD      ? Or  ? ondansetron (ZOFRAN) injection 4 mg  4 mg Intravenous Q6H PRN Jathniel Smeltzer, Reece Leader., MD      ? oxyCODONE (Oxy IR/ROXICODONE) immediate release tablet 5 mg  5 mg Oral Q4H PRN Dontaye Hur, Reece Leader., MD      ? phenazopyridine (PYRIDIUM) tablet 200 mg  200 mg Oral TID PRN Lanna Labella, Reece Leader., MD      ? sodium zirconium cyclosilicate (LOKELMA) packet 10 g  10 g Oral TID Johnson, Clanford L, MD      ? vitamin B-12 (CYANOCOBALAMIN) tablet 1,000 mcg  1,000 mcg Oral Daily Hiroyuki Ozanich, Reece Leader., MD      ? zinc sulfate capsule 220 mg  220 mg Oral Daily Owynn Mosqueda, Reece Leader., MD      ? ? ? ?  Objective: ?Vital signs in last 24 hours: ?Temp:  [97.8 ?F (36.6 ?C)-100.3 ?F (37.9 ?C)] 98.1 ?F (36.7 ?C) (04/28 0557) ?Pulse Rate:  [68-89] 73 (04/28 0557) ?Resp:  [16-26] 20 (04/27 2100) ?BP: (77-148)/(44-81) 106/66 (04/28 0557) ?SpO2:  [97 %-100 %] 99 % (04/28 0557) ?Weight:  [82.6 kg-86 kg] 86 kg (04/27 1707) ? ?Intake/Output from previous day: ?04/27 0701 - 04/28 0700 ?In: 4430.2 [I.V.:1781; IV Piggyback:2649.2] ?Out: 950 [Urine:950] ?Intake/Output this shift: ?No intake/output data recorded. ? ?Physical Exam ?GENERAL APPEARANCE:  Well appearing, well developed, well nourished, NAD ?HEENT:  Atraumatic, normocephalic, oropharynx clear ?ABDOMEN:  Soft, non-tender, no masses ?EXTREMITIES:  Moves all extremities well, without clubbing, cyanosis, or edema ?NEUROLOGIC:  Alert and oriented x 3, CN II-XII grossly intact ?MENTAL STATUS:  appropriate ?BACK:  Non-tender to palpation, No CVAT ?SKIN:  Warm, dry, and intact ?GU:  foley draining clear urine ? ? ?Lab Results:  ?Recent Labs  ?  10/14/21 ?1224 10/15/21 ?2919  ?WBC 16.4* 13.6*  ?HGB 13.2  10.0*  ?HCT 40.6 32.6*  ?PLT 165 131*  ? ?BMET ?Recent Labs  ?  10/14/21 ?1224 10/15/21 ?1660  ?NA 136 136  ?K 4.8 5.6*  ?CL 108 113*  ?CO2 21* 19*  ?GLUCOSE 103* 136*  ?BUN 32* 36*  ?CREATININE 3.35* 2.98*  ?CALCIUM 9.2 7.8*  ? ?PT/INR ?No results for input(s): LABPROT, INR in the last 72 hours. ?ABG ?No results for input(s): PHART, HCO3 in the last 72 hours. ? ?Invalid input(s): PCO2, PO2 ? ?Studies/Results: ?DG C-Arm 1-60 Min-No Report ? ?Result Date: 10/14/2021 ?Fluoroscopy was utilized by the requesting physician.  No radiographic interpretation.  ? ?DG C-Arm 1-60 Min-No Report ? ?Result Date: 10/14/2021 ?Fluoroscopy was utilized by the requesting physician.  No radiographic interpretation.  ? ?CT Renal Stone Study ? ?Result Date: 10/14/2021 ?CLINICAL DATA:  Nephrolithiasis EXAM: CT ABDOMEN AND PELVIS WITHOUT CONTRAST TECHNIQUE: Multidetector CT imaging of the abdomen and pelvis was performed following the standard protocol without IV contrast. RADIATION DOSE REDUCTION: This exam was performed according to the departmental dose-optimization program which includes automated exposure control, adjustment of the mA and/or kV according to patient size and/or use of iterative reconstruction technique. COMPARISON:  CT abdomen/pelvis August 09, 2015. FINDINGS: Lower chest: No acute abnormality. Hepatobiliary: No focal liver abnormality is seen. Status post cholecystectomy. No biliary dilatation. Pancreas: Unremarkable. No pancreatic ductal dilatation or surrounding inflammatory changes. Spleen: Normal in size without focal abnormality. Adrenals/Urinary Tract: Unremarkable adrenal glands. Approximately 1 cm obstructing calculus at the right ureterovesical junction (series 2, image 84). Approximately 1.2 cm obstructing calculus at in the mid left ureter (series 5, image 68; series 2, image 65). Resulting moderate left and mild right hydroureteronephrosis. Left perinephric stranding. Additional smaller bilateral  nonobstructing renal calculi. Bilateral low-attenuation renal lesions, compatible with cysts. Stomach/Bowel: Stomach is within normal limits. Appendix appears normal. No evidence of bowel wall thickening, distention, or inflammatory changes. Vascular/Lymphatic: Aortic atherosclerosis. No enlarged abdominal or pelvic lymph nodes. Reproductive: Prostate is unremarkable. Other: No sizable abdominal wall hernia or abnormality. No abdominopelvic ascites. Musculoskeletal: Multilevel degenerative change in the visualized spine. No evidence of acute fracture. IMPRESSION: 1. Obstructing 1.2 cm left mid ureter calculus with moderate left hydroureteronephrosis. 2. Obstructing 1.0 cm right UVJ calculus with mild right hydroureteronephrosis. 3. Additional nonobstructing bilateral renal calculi. 4. Colonic diverticulosis. 5.  Aortic Atherosclerosis (ICD10-I70.0). Electronically Signed   By: Margaretha Sheffield M.D.   On: 10/14/2021 13:37   ? ? ?Assessment & Plan: ?Bilateral ureteral calculi -  postop day #1 status post cystoscopy, bilateral ureteral stent placement, right USM with laser lithotripsy ?Acute kidney injury -renal function improving ?Possible UTI -cultures pending, on IV Rocephin ? ?He is doing well today. ?Disposition per medicine. ?May DC Foley if no longer needed for I&O management ?Please discharge with oral antibiotics. ?Needs follow-up in 1 week to make arrangements for outpatient treatment of left ureteral calculus ? ? ?Michaelle Birks, MD ?10/15/2021 ? ?

## 2021-10-16 LAB — URINE CULTURE: Culture: NO GROWTH

## 2021-10-17 DIAGNOSIS — I10 Essential (primary) hypertension: Secondary | ICD-10-CM | POA: Diagnosis not present

## 2021-10-17 DIAGNOSIS — E7800 Pure hypercholesterolemia, unspecified: Secondary | ICD-10-CM | POA: Diagnosis not present

## 2021-10-18 ENCOUNTER — Encounter (HOSPITAL_COMMUNITY): Payer: Self-pay | Admitting: Urology

## 2021-10-18 DIAGNOSIS — Z789 Other specified health status: Secondary | ICD-10-CM | POA: Diagnosis not present

## 2021-10-18 DIAGNOSIS — Z09 Encounter for follow-up examination after completed treatment for conditions other than malignant neoplasm: Secondary | ICD-10-CM | POA: Diagnosis not present

## 2021-10-18 DIAGNOSIS — I10 Essential (primary) hypertension: Secondary | ICD-10-CM | POA: Diagnosis not present

## 2021-10-18 DIAGNOSIS — G629 Polyneuropathy, unspecified: Secondary | ICD-10-CM | POA: Diagnosis not present

## 2021-10-18 DIAGNOSIS — N200 Calculus of kidney: Secondary | ICD-10-CM | POA: Diagnosis not present

## 2021-10-18 LAB — SURGICAL PATHOLOGY

## 2021-10-19 ENCOUNTER — Telehealth: Payer: Self-pay

## 2021-10-19 ENCOUNTER — Other Ambulatory Visit: Payer: Self-pay | Admitting: Urology

## 2021-10-19 DIAGNOSIS — N201 Calculus of ureter: Secondary | ICD-10-CM

## 2021-10-19 NOTE — Telephone Encounter (Signed)
-----   Message from Primus Bravo, MD sent at 10/19/2021  8:49 AM EDT ----- ?Mr. Thomley needs a KUB prior to his visit on 10/21/21.  I have put in the order. ? ?Thanks. ? ?

## 2021-10-19 NOTE — Telephone Encounter (Signed)
Patient called and notified. Voiced understanding.  

## 2021-10-21 ENCOUNTER — Encounter: Payer: Self-pay | Admitting: Urology

## 2021-10-21 ENCOUNTER — Ambulatory Visit (HOSPITAL_COMMUNITY)
Admission: RE | Admit: 2021-10-21 | Discharge: 2021-10-21 | Disposition: A | Discharge status: Home / Self Care | Payer: PPO | Source: Ambulatory Visit | Attending: Urology | Admitting: Urology

## 2021-10-21 ENCOUNTER — Other Ambulatory Visit: Payer: Self-pay

## 2021-10-21 ENCOUNTER — Ambulatory Visit: Payer: PPO | Admitting: Urology

## 2021-10-21 VITALS — BP 173/75 | HR 54

## 2021-10-21 DIAGNOSIS — N179 Acute kidney failure, unspecified: Secondary | ICD-10-CM

## 2021-10-21 DIAGNOSIS — N201 Calculus of ureter: Secondary | ICD-10-CM

## 2021-10-21 DIAGNOSIS — N200 Calculus of kidney: Secondary | ICD-10-CM

## 2021-10-21 DIAGNOSIS — R109 Unspecified abdominal pain: Secondary | ICD-10-CM | POA: Diagnosis not present

## 2021-10-21 LAB — MICROSCOPIC EXAMINATION
Bacteria, UA: NONE SEEN
RBC, Urine: >30 /hpf — AB (ref 0–2)
Renal Epithel, UA: NONE SEEN /hpf

## 2021-10-21 LAB — URINALYSIS, ROUTINE W REFLEX MICROSCOPIC
Bilirubin, UA: NEGATIVE
Glucose, UA: NEGATIVE
Ketones, UA: NEGATIVE
Nitrite, UA: NEGATIVE
Specific Gravity, UA: 1.02 (ref 1.005–1.030)
Urobilinogen, Ur: 0.2 mg/dL (ref 0.2–1.0)
pH, UA: 6 (ref 5.0–7.5)

## 2021-10-21 LAB — CULTURE, BLOOD (ROUTINE X 2)
Culture: NO GROWTH
Culture: NO GROWTH
Special Requests: ADEQUATE
Special Requests: ADEQUATE

## 2021-10-21 NOTE — Progress Notes (Signed)
? ?Assessment: ?1. Ureteral calculus   ?2. Nephrolithiasis   ?3. AKI (acute kidney injury) (Charles Brooks)   ? ? ?Plan: ?I reviewed the KUB from today.  A 6 x 11 mm calcification is seen at the level of L5 on the left consistent with the known left ureteral calculus. ?Options for management discussed including shockwave lithotripsy or ureteroscopic laser lithotripsy. ?He would like to proceed with shockwave lithotripsy.  We will make arrangements for this next week. ?BMP today ? ?Procedure: ?The patient will be scheduled for Left ESL at [facility].  Surgical request is placed with the surgery schedulers and will be scheduled at the patient's/family request. Informed consent is given as documented below. ?Anesthesia:  local ? ?The patient does not have sleep apnea, history of MRSA, history of VRE, history of cardiac device requiring special anesthetic needs. ?Patient is stable and considered clear for surgical in an outpatient ambulatory surgery setting as well as patient hospital setting. ? ?Consent for Operation or Procedure: Provider Certification ?I hereby certify that the nature, purpose, benefits, usual and most frequent risks of, and alternatives to, the operation or procedure have been explained to the patient (or person authorized to sign for the patient) either by me as responsible physician or by the provider who is to perform the operation or procedure. Time spent such that the patient/family has had an opportunity to ask questions, and that those questions have been answered. The patient or the patient's representative has been advised that selected tasks may be performed by assistants to the primary health care provider(s). I believe that the patient (or person authorized to sign for the patient) understands what has been explained, and has consented to the operation or procedure. No guarantees were implied or made. ? ? ?Chief Complaint: ?Chief Complaint  ?Patient presents with  ? ureteral calculi   ? ? ?HPI: ?Charles Brooks is a 71 y.o. male who presents for continued evaluation of bilateral ureteral calculi. ?He presented to the emergency room on 10/14/2021 with acute onset of left-sided flank pain.  Evaluation with CT imaging demonstrated a 1.2 cm obstructing left mid ureteral calculus and a 1 cm obstructing right distal ureteral calculus.  The patient's creatinine was elevated from his baseline to 3.35.  He had subjective chills but no fever.  Urinalysis showed WBCs and bacteria but was nitrite negative.  Lactate was normal.   ?He underwent cystoscopy, bilateral retrograde pyelograms, right ureteroscopic laser lithotripsy, and insertion of a left ureteral stent.  The right ureteral calculus was removed. ?His creatinine improved to 2.98 on 10/15/2021.  He was discharged from the hospital at that time. ? ?He returns today for follow-up.  He has done well since the procedure.  He is not having any significant stent related symptoms.  No dysuria or gross hematuria.  No flank pain.  He is completing his antibiotics. ? ?Portions of the above documentation were copied from a prior visit for review purposes only. ? ?Allergies: ?Allergies  ?Allergen Reactions  ? Codeine Hives  ? ? ?PMH: ?Past Medical History:  ?Diagnosis Date  ? Arthritis   ? CAD (coronary artery disease), native coronary artery   ? DES RCA and PTCA RVM 07/2014, LVEF 55%  ? Cellulitis and abscess 08/08/2015  ? CKD (chronic kidney disease) stage 3, GFR 30-59 ml/min (HCC)   ? Essential hypertension   ? History of kidney stones   ? Hyperkalemia 07/06/2019  ? Hyperlipidemia   ? STEMI (ST elevation myocardial infarction) (Charles Brooks)   ?  07/2014  ? ? ?PSH: ?Past Surgical History:  ?Procedure Laterality Date  ? CHOLECYSTECTOMY    ? COLONOSCOPY N/A 02/09/2021  ? Procedure: COLONOSCOPY;  Surgeon: Aviva Signs, MD;  [facility];  Service: Gastroenterology;  Laterality: N/A;  ? COLONOSCOPY WITH PROPOFOL N/A 11/08/2013  ? Procedure: COLONOSCOPY WITH PROPOFOL;   Surgeon: [name], MD;  Location: [facility];  Service: General;  Laterality: N/A;  in cecum at 0741; cecal withdrawal time = 10 min  ? CYSTOSCOPY W/ RETROGRADES Bilateral 10/14/2021  ? Procedure: CYSTOSCOPY WITH RETROGRADE PYELOGRAM;  Surgeon: Primus Bravo., MD;  Location: [facility];  Service: Urology;  Laterality: Bilateral;  ? CYSTOSCOPY WITH STENT PLACEMENT Bilateral 10/14/2021  ? Procedure: CYSTOSCOPY WITH STENT PLACEMENT;  Surgeon: Primus Bravo., MD;  Location: [facility];  Service: Urology;  Laterality: Bilateral;  ? EXTRACORPOREAL SHOCK WAVE LITHOTRIPSY    ? EXTRACORPOREAL SHOCK WAVE LITHOTRIPSY Right 09/22/2020  ? Procedure: EXTRACORPOREAL SHOCK WAVE LITHOTRIPSY (ESWL);  Surgeon: Cleon Gustin, MD;  Location: [facility];  Service: Urology;  Laterality: Right;  cases not starting until 10:00 due to last minute add ons, need time to get covid tests back  ? INCISION AND DRAINAGE ABSCESS Right 08/12/2015  ? Procedure: INCISION AND DRAINAGE ABSCESS;  Surgeon: Aviva Signs, MD;  Location: [facility];  Service: General;  Laterality: Right;  ? KNEE ARTHROSCOPY Right   ? LEFT HEART CATHETERIZATION WITH CORONARY ANGIOGRAM N/A 08/14/2014  ? Procedure: LEFT HEART CATHETERIZATION WITH CORONARY ANGIOGRAM;  Surgeon: Troy Sine, MD;  LAD 50-60%, CFX OK, OM1 80%(small), RCA 100>>0% w/ 2.25x24 mm Synergy DES, EF 55%  ? POLYPECTOMY N/A 11/08/2013  ? Procedure: POLYPECTOMY;  Surgeon: [name], MD;  [facility]: [facility];  Service: General;  Laterality: N/A;  cecal polyp  ? POLYPECTOMY  02/09/2021  ? Procedure: POLYPECTOMY INTESTINAL;  Surgeon: Aviva Signs, MD;  [facility];  Service: Gastroenterology;;  ? Removal of kidney stones    ? Open  ? URETEROSCOPY WITH HOLMIUM LASER LITHOTRIPSY Right 10/14/2021  ? Procedure: URETEROSCOPY WITH HOLMIUM LASER LITHOTRIPSY;  Surgeon: Primus Bravo., MD;  [facility];  Service: Urology;  Laterality: Right;  ? WOUND DEBRIDEMENT Right 10/26/2015  ? Procedure:  DEBRIDEMENT WOUND RIGHT FLANK;  Surgeon: Aviva Signs, MD;  Location: [facility];  Service: General;  Laterality: Right;  ? ? ?SH: ?Social History  ? ?Tobacco Use  ? Smoking status: Former  ?  Types: Cigars  ?  Quit date: 11/05/2003  ?  Years since quitting: 17.9  ? Smokeless tobacco: Never  ?Vaping Use  ? Vaping Use: Never used  ?Substance Use Topics  ? Alcohol use: No  ?  Alcohol/week: 0.0 standard drinks  ? Drug use: No  ? ? ?ROS: ?Constitutional:  Negative for fever, chills, weight loss ?CV: Negative for chest pain, previous MI, hypertension ?Respiratory:  Negative for shortness of breath, wheezing, sleep apnea, frequent cough ?GI:  Negative for nausea, vomiting, bloody stool, GERD ? ?PE: ?BP (!) 173/75   Pulse (!) 54  ?GENERAL APPEARANCE:  Well appearing, well developed, well nourished, NAD ?HEENT:  Atraumatic, normocephalic, oropharynx clear ?NECK:  Supple without lymphadenopathy or thyromegaly ?ABDOMEN:  Soft, non-tender, no masses ?EXTREMITIES:  Moves all extremities well, without clubbing, cyanosis, or edema ?NEUROLOGIC:  Alert and oriented x 3, normal gait, CN II-XII grossly intact ?MENTAL STATUS:  appropriate ?BACK:  Non-tender to palpation, No CVAT ?SKIN:  Warm, dry, and intact ? ? ?Results: ?U/A: 0-5 WBCs, >  30 RBCs ? ?

## 2021-10-21 NOTE — H&P (View-Only) (Signed)
? ?Assessment: ?1. Ureteral calculus   ?2. Nephrolithiasis   ?3. AKI (acute kidney injury) ([place])   ? ? ?Plan: ?I reviewed the KUB from today.  A 6 x 11 mm calcification is seen at the level of L5 on the left consistent with the known left ureteral calculus. ?Options for management discussed including shockwave lithotripsy or ureteroscopic laser lithotripsy. ?He would like to proceed with shockwave lithotripsy.  We will make arrangements for this next week. ?BMP today ? ?Procedure: ?The patient will be scheduled for Left ESL at [facility].  Surgical request is placed with the surgery schedulers and will be scheduled at the patient's/family request. Informed consent is given as documented below. ?Anesthesia:  local ? ?The patient does not have sleep apnea, history of MRSA, history of VRE, history of cardiac device requiring special anesthetic needs. ?Patient is stable and considered clear for surgical in an outpatient ambulatory surgery setting as well as patient hospital setting. ? ?Consent for Operation or Procedure: Provider Certification ?I hereby certify that the nature, purpose, benefits, usual and most frequent risks of, and alternatives to, the operation or procedure have been explained to the patient (or person authorized to sign for the patient) either by me as responsible physician or by the provider who is to perform the operation or procedure. Time spent such that the patient/family has had an opportunity to ask questions, and that those questions have been answered. The patient or the patient's representative has been advised that selected tasks may be performed by assistants to the primary health care provider(s). I believe that the patient (or person authorized to sign for the patient) understands what has been explained, and has consented to the operation or procedure. No guarantees were implied or made. ? ? ?Chief Complaint: ?Chief Complaint  ?Patient presents with  ? ureteral calculi   ? ? ?HPI: ?Charles Brooks is a 71 y.o. male who presents for continued evaluation of bilateral ureteral calculi. ?He presented to the emergency room on 10/14/2021 with acute onset of left-sided flank pain.  Evaluation with CT imaging demonstrated a 1.2 cm obstructing left mid ureteral calculus and a 1 cm obstructing right distal ureteral calculus.  The patient's creatinine was elevated from his baseline to 3.35.  He had subjective chills but no fever.  Urinalysis showed WBCs and bacteria but was nitrite negative.  Lactate was normal.   ?He underwent cystoscopy, bilateral retrograde pyelograms, right ureteroscopic laser lithotripsy, and insertion of a left ureteral stent.  The right ureteral calculus was removed. ?His creatinine improved to 2.98 on 10/15/2021.  He was discharged from the hospital at that time. ? ?He returns today for follow-up.  He has done well since the procedure.  He is not having any significant stent related symptoms.  No dysuria or gross hematuria.  No flank pain.  He is completing his antibiotics. ? ?Portions of the above documentation were copied from a prior visit for review purposes only. ? ?Allergies: ?Allergies  ?Allergen Reactions  ? Codeine Hives  ? ? ?PMH: ?Past Medical History:  ?Diagnosis Date  ? Arthritis   ? CAD (coronary artery disease), native coronary artery   ? DES RCA and PTCA RVM 07/2014, LVEF 55%  ? Cellulitis and abscess 08/08/2015  ? CKD (chronic kidney disease) stage 3, GFR 30-59 ml/min (HCC)   ? Essential hypertension   ? History of kidney stones   ? Hyperkalemia 07/06/2019  ? Hyperlipidemia   ? STEMI (ST elevation myocardial infarction) ([place])   ?  07/2014  ? ? ?PSH: ?Past Surgical History:  ?Procedure Laterality Date  ? CHOLECYSTECTOMY    ? COLONOSCOPY N/A 02/09/2021  ? Procedure: COLONOSCOPY;  Surgeon: Aviva Signs, MD;  [facility];  Service: Gastroenterology;  Laterality: N/A;  ? COLONOSCOPY WITH PROPOFOL N/A 11/08/2013  ? Procedure: COLONOSCOPY WITH PROPOFOL;   Surgeon: [name], MD;  Location: [facility];  Service: General;  Laterality: N/A;  in cecum at 0741; cecal withdrawal time = 10 min  ? CYSTOSCOPY W/ RETROGRADES Bilateral 10/14/2021  ? Procedure: CYSTOSCOPY WITH RETROGRADE PYELOGRAM;  Surgeon: Primus Bravo., MD;  [facility]: [facility];  Service: Urology;  Laterality: Bilateral;  ? CYSTOSCOPY WITH STENT PLACEMENT Bilateral 10/14/2021  ? Procedure: CYSTOSCOPY WITH STENT PLACEMENT;  Surgeon: Primus Bravo., MD;  [facility];  Service: Urology;  Laterality: Bilateral;  ? EXTRACORPOREAL SHOCK WAVE LITHOTRIPSY    ? EXTRACORPOREAL SHOCK WAVE LITHOTRIPSY Right 09/22/2020  ? Procedure: EXTRACORPOREAL SHOCK WAVE LITHOTRIPSY (ESWL);  Surgeon: Cleon Gustin, MD;  [facility];  Service: Urology;  Laterality: Right;  cases not starting until 10:00 due to last minute add ons, need time to get covid tests back  ? INCISION AND DRAINAGE ABSCESS Right 08/12/2015  ? Procedure: INCISION AND DRAINAGE ABSCESS;  Surgeon: Aviva Signs, MD;  [facility]: [facility];  Service: General;  Laterality: Right;  ? KNEE ARTHROSCOPY Right   ? LEFT HEART CATHETERIZATION WITH CORONARY ANGIOGRAM N/A 08/14/2014  ? Procedure: LEFT HEART CATHETERIZATION WITH CORONARY ANGIOGRAM;  Surgeon: Troy Sine, MD;  LAD 50-60%, CFX OK, OM1 80%(small), RCA 100>>0% w/ 2.25x24 mm Synergy DES, EF 55%  ? POLYPECTOMY N/A 11/08/2013  ? Procedure: POLYPECTOMY;  Surgeon: [name], MD;  [facility]: [facility];  Service: General;  Laterality: N/A;  cecal polyp  ? POLYPECTOMY  02/09/2021  ? Procedure: POLYPECTOMY INTESTINAL;  Surgeon: Aviva Signs, MD;  [facility];  Service: Gastroenterology;;  ? Removal of kidney stones    ? Open  ? URETEROSCOPY WITH HOLMIUM LASER LITHOTRIPSY Right 10/14/2021  ? Procedure: URETEROSCOPY WITH HOLMIUM LASER LITHOTRIPSY;  Surgeon: Primus Bravo., MD;  [facility];  Service: Urology;  Laterality: Right;  ? WOUND DEBRIDEMENT Right 10/26/2015  ? Procedure:  DEBRIDEMENT WOUND RIGHT FLANK;  Surgeon: Aviva Signs, MD;  Location: [facility];  Service: General;  Laterality: Right;  ? ? ?SH: ?Social History  ? ?Tobacco Use  ? Smoking status: Former  ?  Types: Cigars  ?  Quit date: 11/05/2003  ?  Years since quitting: 17.9  ? Smokeless tobacco: Never  ?Vaping Use  ? Vaping Use: Never used  ?Substance Use Topics  ? Alcohol use: No  ?  Alcohol/week: 0.0 standard drinks  ? Drug use: No  ? ? ?ROS: ?Constitutional:  Negative for fever, chills, weight loss ?CV: Negative for chest pain, previous MI, hypertension ?Respiratory:  Negative for shortness of breath, wheezing, sleep apnea, frequent cough ?GI:  Negative for nausea, vomiting, bloody stool, GERD ? ?PE: ?BP (!) 173/75   Pulse (!) 54  ?GENERAL APPEARANCE:  Well appearing, well developed, well nourished, NAD ?HEENT:  Atraumatic, normocephalic, oropharynx clear ?NECK:  Supple without lymphadenopathy or thyromegaly ?ABDOMEN:  Soft, non-tender, no masses ?EXTREMITIES:  Moves all extremities well, without clubbing, cyanosis, or edema ?NEUROLOGIC:  Alert and oriented x 3, normal gait, CN II-XII grossly intact ?MENTAL STATUS:  appropriate ?BACK:  Non-tender to palpation, No CVAT ?SKIN:  Warm, dry, and intact ? ? ?Results: ?U/A: 0-5 WBCs, >  30 RBCs ? ?

## 2021-10-22 LAB — BASIC METABOLIC PANEL
BUN/Creatinine Ratio: 13 (ref 10–24)
BUN: 25 mg/dL (ref 8–27)
CO2: 20 mmol/L (ref 20–29)
Calcium: 9.5 mg/dL (ref 8.6–10.2)
Chloride: 105 mmol/L (ref 96–106)
Creatinine, Ser: 1.87 mg/dL — ABNORMAL HIGH (ref 0.76–1.27)
Glucose: 97 mg/dL (ref 70–99)
Potassium: 5.2 mmol/L (ref 3.5–5.2)
Sodium: 141 mmol/L (ref 134–144)
eGFR: 38 mL/min/{1.73_m2} — ABNORMAL LOW (ref >59)

## 2021-10-25 ENCOUNTER — Encounter (HOSPITAL_COMMUNITY)
Admission: RE | Admit: 2021-10-25 | Discharge: 2021-10-25 | Disposition: A | Discharge status: Home / Self Care | Payer: PPO | Source: Ambulatory Visit | Attending: Urology | Admitting: Urology

## 2021-10-26 ENCOUNTER — Encounter (HOSPITAL_COMMUNITY)
Admission: RE | Disposition: A | Discharge status: Home / Self Care | Payer: Self-pay | Source: Ambulatory Visit | Attending: Urology

## 2021-10-26 ENCOUNTER — Ambulatory Visit (HOSPITAL_COMMUNITY): Payer: PPO

## 2021-10-26 ENCOUNTER — Ambulatory Visit (HOSPITAL_COMMUNITY)
Admission: RE | Admit: 2021-10-26 | Discharge: 2021-10-26 | Disposition: A | Discharge status: Home / Self Care | Payer: PPO | Source: Ambulatory Visit | Attending: Urology | Admitting: Urology

## 2021-10-26 DIAGNOSIS — Z87442 Personal history of urinary calculi: Secondary | ICD-10-CM | POA: Diagnosis not present

## 2021-10-26 DIAGNOSIS — I129 Hypertensive chronic kidney disease with stage 1 through stage 4 chronic kidney disease, or unspecified chronic kidney disease: Secondary | ICD-10-CM | POA: Insufficient documentation

## 2021-10-26 DIAGNOSIS — N201 Calculus of ureter: Secondary | ICD-10-CM | POA: Diagnosis not present

## 2021-10-26 DIAGNOSIS — N183 Chronic kidney disease, stage 3 unspecified: Secondary | ICD-10-CM | POA: Diagnosis not present

## 2021-10-26 DIAGNOSIS — N200 Calculus of kidney: Secondary | ICD-10-CM | POA: Diagnosis not present

## 2021-10-26 DIAGNOSIS — M47816 Spondylosis without myelopathy or radiculopathy, lumbar region: Secondary | ICD-10-CM | POA: Insufficient documentation

## 2021-10-26 DIAGNOSIS — Z87891 Personal history of nicotine dependence: Secondary | ICD-10-CM | POA: Insufficient documentation

## 2021-10-26 HISTORY — PX: EXTRACORPOREAL SHOCK WAVE LITHOTRIPSY: SHX1557

## 2021-10-26 SURGERY — LITHOTRIPSY, ESWL
Anesthesia: LOCAL | Laterality: Left

## 2021-10-26 MED ORDER — HYDROCODONE-ACETAMINOPHEN 5-325 MG PO TABS: 1.0000 | ORAL_TABLET | Freq: Four times a day (QID) | ORAL | 0 refills | Status: DC | PRN

## 2021-10-26 MED ORDER — DIPHENHYDRAMINE HCL 25 MG PO CAPS
25.0000 mg | ORAL_CAPSULE | ORAL | Status: AC
  Administered 2021-10-26: 25 mg via ORAL
  Filled 2021-10-26: qty 1

## 2021-10-26 MED ORDER — CEFDINIR 300 MG PO CAPS: 300.0000 mg | ORAL_CAPSULE | Freq: Every day | ORAL | 0 refills | Status: AC

## 2021-10-26 MED ORDER — DIAZEPAM 5 MG PO TABS
10.0000 mg | ORAL_TABLET | Freq: Once | ORAL | Status: AC
  Administered 2021-10-26: 10 mg via ORAL
  Filled 2021-10-26: qty 2

## 2021-10-26 MED ORDER — SODIUM CHLORIDE 0.9 % IV SOLN: INTRAVENOUS | Status: DC

## 2021-10-26 NOTE — Interval H&P Note (Signed)
History and Physical Interval Note: ? ?10/26/2021 ?10:16 AM ? ?Charles Brooks  has presented today for surgery, with the diagnosis of left ureteral calculus.  The various methods of treatment have been discussed with the patient and family. After consideration of risks, benefits and other options for treatment, the patient has consented to  Procedure(s): ?EXTRACORPOREAL SHOCK WAVE LITHOTRIPSY (ESWL) (Left) as a surgical intervention.  The patient's history has been reviewed, patient examined, no change in status, stable for surgery.  I have reviewed the patient's chart and labs.  Questions were answered to the patient's satisfaction.   ? ? ?[name] [name] ? ? ?

## 2021-10-27 ENCOUNTER — Encounter (HOSPITAL_COMMUNITY): Payer: Self-pay | Admitting: Urology

## 2021-10-28 ENCOUNTER — Other Ambulatory Visit: Payer: PPO | Admitting: Urology

## 2021-11-04 ENCOUNTER — Encounter: Payer: Self-pay | Admitting: Urology

## 2021-11-04 ENCOUNTER — Ambulatory Visit (HOSPITAL_COMMUNITY)
Admission: RE | Admit: 2021-11-04 | Discharge: 2021-11-04 | Disposition: A | Discharge status: Home / Self Care | Payer: PPO | Source: Ambulatory Visit | Attending: Urology | Admitting: Urology

## 2021-11-04 ENCOUNTER — Ambulatory Visit: Payer: PPO | Admitting: Urology

## 2021-11-04 VITALS — BP 128/69 | HR 59

## 2021-11-04 DIAGNOSIS — Z466 Encounter for fitting and adjustment of urinary device: Secondary | ICD-10-CM | POA: Diagnosis not present

## 2021-11-04 DIAGNOSIS — Z960 Presence of urogenital implants: Secondary | ICD-10-CM | POA: Diagnosis not present

## 2021-11-04 DIAGNOSIS — N201 Calculus of ureter: Secondary | ICD-10-CM | POA: Diagnosis not present

## 2021-11-04 DIAGNOSIS — N200 Calculus of kidney: Secondary | ICD-10-CM | POA: Diagnosis not present

## 2021-11-04 LAB — URINALYSIS, ROUTINE W REFLEX MICROSCOPIC
Bilirubin, UA: NEGATIVE
Glucose, UA: NEGATIVE
Ketones, UA: NEGATIVE
Nitrite, UA: NEGATIVE
Specific Gravity, UA: 1.01 (ref 1.005–1.030)
Urobilinogen, Ur: 0.2 mg/dL (ref 0.2–1.0)
pH, UA: 5.5 (ref 5.0–7.5)

## 2021-11-04 LAB — MICROSCOPIC EXAMINATION
Bacteria, UA: NONE SEEN
Epithelial Cells (non renal): NONE SEEN /hpf (ref 0–10)
Renal Epithel, UA: NONE SEEN /hpf

## 2021-11-04 MED ORDER — CIPROFLOXACIN HCL 500 MG PO TABS
500.0000 mg | ORAL_TABLET | Freq: Once | ORAL | Status: AC
  Administered 2021-11-04: 500 mg via ORAL

## 2021-11-04 NOTE — Progress Notes (Signed)
Assessment: 1. Ureteral calculus     Plan: Bilateral ureteral stents removed today. Cipro x1 following stent removal. Continue to strain urine. Return to office in 2-3 weeks with KUB prior to visit.  Chief Complaint: Chief Complaint  Patient presents with   ureteral calculus    HPI: Charles Brooks is a 71 y.o. male who presents for continued evaluation of bilateral ureteral calculi. He presented to the emergency room on 10/14/2021 with acute onset of left-sided flank pain.  Evaluation with CT imaging demonstrated a 1.2 cm obstructing left mid ureteral calculus and a 1 cm obstructing right distal ureteral calculus.  The patient's creatinine was elevated from his baseline to 3.35.  He had subjective chills but no fever.  Urinalysis showed WBCs and bacteria but was nitrite negative.  Lactate was normal.   He underwent cystoscopy, bilateral retrograde pyelograms, right ureteroscopic laser lithotripsy, and insertion of a left ureteral stent.  The right ureteral calculus was removed. His creatinine improved to 2.98 on 10/15/2021.  He was discharged from the hospital at that time. KUB from 10/21/21 showed a 6 x 11 mm calcification at the level of L5 on the left consistent with the known left ureteral calculus. Cr 1.87 from 10/21/21.  He underwent left ESL on 10/26/2021.  He has done well since the procedure. KUB from today demonstrates bilateral ureteral stents in good position, possible small calcifications along the distal aspect of the left ureteral stent. He is not aware of passing any stones since the lithotripsy.  He is tolerating the stent as well.  No fever, chills, flank pain.  Portions of the above documentation were copied from a prior visit for review purposes only.  Allergies: Allergies  Allergen Reactions   Codeine Hives    PMH: Past Medical History:  Diagnosis Date   Arthritis    CAD (coronary artery disease), native coronary artery    DES RCA and PTCA RVM 07/2014, LVEF  55%   Cellulitis and abscess 08/08/2015   CKD (chronic kidney disease) stage 3, GFR 30-59 ml/min (HCC)    Essential hypertension    History of kidney stones    Hyperkalemia 07/06/2019   Hyperlipidemia    STEMI (ST elevation myocardial infarction) ([place])    07/2014    PSH: Past Surgical History:  Procedure Laterality Date   CHOLECYSTECTOMY     COLONOSCOPY N/A 02/09/2021   Procedure: COLONOSCOPY;  Surgeon: Aviva Signs, MD;  [facility];  Service: Gastroenterology;  Laterality: N/A;   COLONOSCOPY WITH PROPOFOL N/A 11/08/2013   Procedure: COLONOSCOPY WITH PROPOFOL;  Surgeon: [name], MD;  Location: [facility];  Service: General;  Laterality: N/A;  in cecum at 0741; cecal withdrawal time = 10 min   CYSTOSCOPY W/ RETROGRADES Bilateral 10/14/2021   Procedure: CYSTOSCOPY WITH RETROGRADE PYELOGRAM;  Surgeon: Primus Bravo., MD;  [facility]: [facility];  Service: Urology;  Laterality: Bilateral;   CYSTOSCOPY WITH STENT PLACEMENT Bilateral 10/14/2021   Procedure: CYSTOSCOPY WITH STENT PLACEMENT;  Surgeon: Primus Bravo., MD;  [facility]: [facility];  Service: Urology;  Laterality: Bilateral;   EXTRACORPOREAL SHOCK WAVE LITHOTRIPSY     EXTRACORPOREAL SHOCK WAVE LITHOTRIPSY Right 09/22/2020   Procedure: EXTRACORPOREAL SHOCK WAVE LITHOTRIPSY (ESWL);  Surgeon: Cleon Gustin, MD;  [facility]: [facility];  Service: Urology;  Laterality: Right;  cases not starting until 10:00 due to last minute add ons, need time to get covid tests back   EXTRACORPOREAL SHOCK WAVE LITHOTRIPSY Left 10/26/2021   Procedure: EXTRACORPOREAL SHOCK  WAVE LITHOTRIPSY (ESWL);  Surgeon: Primus Bravo., MD;  [facility];  Service: Urology;  Laterality: Left;   INCISION AND DRAINAGE ABSCESS Right 08/12/2015   Procedure: INCISION AND DRAINAGE ABSCESS;  Surgeon: Aviva Signs, MD;  [facility];  Service: General;  Laterality: Right;   KNEE ARTHROSCOPY Right    LEFT HEART CATHETERIZATION WITH CORONARY ANGIOGRAM  N/A 08/14/2014   Procedure: LEFT HEART CATHETERIZATION WITH CORONARY ANGIOGRAM;  Surgeon: Troy Sine, MD;  LAD 50-60%, CFX OK, OM1 80%(small), RCA 100>>0% w/ 2.25x24 mm Synergy DES, EF 55%   POLYPECTOMY N/A 11/08/2013   Procedure: POLYPECTOMY;  Surgeon: [name], MD;  [facility];  Service: General;  Laterality: N/A;  cecal polyp   POLYPECTOMY  02/09/2021   Procedure: POLYPECTOMY INTESTINAL;  Surgeon: Aviva Signs, MD;  [facility];  Service: Gastroenterology;;   Removal of kidney stones     Open   URETEROSCOPY WITH HOLMIUM LASER LITHOTRIPSY Right 10/14/2021   Procedure: URETEROSCOPY WITH HOLMIUM LASER LITHOTRIPSY;  Surgeon: Primus Bravo., MD;  [facility];  Service: Urology;  Laterality: Right;   WOUND DEBRIDEMENT Right 10/26/2015   Procedure: DEBRIDEMENT WOUND RIGHT FLANK;  Surgeon: Aviva Signs, MD;  [facility];  Service: General;  Laterality: Right;    SH: Social History   Tobacco Use   Smoking status: Former    Types: Cigars    Quit date: 11/05/2003    Years since quitting: 18.0   Smokeless tobacco: Never  Vaping Use   Vaping Use: Never used  Substance Use Topics   Alcohol use: No    Alcohol/week: 0.0 standard drinks   Drug use: No    ROS: Constitutional:  Negative for fever, chills, weight loss CV: Negative for chest pain, previous MI, hypertension Respiratory:  Negative for shortness of breath, wheezing, sleep apnea, frequent cough GI:  Negative for nausea, vomiting, bloody stool, GERD  PE: BP 128/69   Pulse (!) 59  GENERAL APPEARANCE:  Well appearing, well developed, well nourished, NAD HEENT:  Atraumatic, normocephalic, oropharynx clear NECK:  Supple without lymphadenopathy or thyromegaly ABDOMEN:  Soft, non-tender, no masses EXTREMITIES:  Moves all extremities well, without clubbing, cyanosis, or edema NEUROLOGIC:  Alert and oriented x 3, normal gait, CN II-XII grossly intact MENTAL STATUS:  appropriate BACK:  Non-tender  to palpation, No CVAT SKIN:  Warm, dry, and intact   Results: U/A: 11-30 RBC  Procedure:  Flexible Cystourethroscopy/Bilateral ureteral stent removal  Pre-operative Diagnosis:  Bilateral ureteral calculi  Post-operative Diagnosis:  Bilateral ureteral calculi  Anesthesia:  local with lidocaine jelly  Surgical Narrative:  After appropriate informed consent was obtained, the patient was prepped and draped in the usual sterile fashion in the supine position.  The patient was correctly identified and the proper procedure delineated prior to proceeding.  Sterile lidocaine gel was instilled in the urethra. The flexible cystoscope was introduced without difficulty. Bilateral ureteral stents noted.  Using stent graspers, both stents were removed without difficulty.  The cystoscope was then removed.  The patient tolerated the procedure well.

## 2021-11-18 ENCOUNTER — Ambulatory Visit (INDEPENDENT_AMBULATORY_CARE_PROVIDER_SITE_OTHER): Payer: PPO | Admitting: Urology

## 2021-11-18 ENCOUNTER — Ambulatory Visit (HOSPITAL_COMMUNITY)
Admission: RE | Admit: 2021-11-18 | Discharge: 2021-11-18 | Disposition: A | Discharge status: Home / Self Care | Payer: PPO | Source: Ambulatory Visit | Attending: Urology | Admitting: Urology

## 2021-11-18 ENCOUNTER — Encounter: Payer: Self-pay | Admitting: Urology

## 2021-11-18 VITALS — BP 124/66 | HR 71

## 2021-11-18 DIAGNOSIS — N201 Calculus of ureter: Secondary | ICD-10-CM | POA: Diagnosis not present

## 2021-11-18 DIAGNOSIS — N200 Calculus of kidney: Secondary | ICD-10-CM | POA: Diagnosis not present

## 2021-11-18 DIAGNOSIS — Z87442 Personal history of urinary calculi: Secondary | ICD-10-CM

## 2021-11-18 DIAGNOSIS — Z9049 Acquired absence of other specified parts of digestive tract: Secondary | ICD-10-CM | POA: Diagnosis not present

## 2021-11-18 LAB — URINALYSIS, ROUTINE W REFLEX MICROSCOPIC
Bilirubin, UA: NEGATIVE
Glucose, UA: NEGATIVE
Ketones, UA: NEGATIVE
Leukocytes,UA: NEGATIVE
Nitrite, UA: NEGATIVE
Specific Gravity, UA: 1.015 (ref 1.005–1.030)
Urobilinogen, Ur: 0.2 mg/dL (ref 0.2–1.0)
pH, UA: 5.5 (ref 5.0–7.5)

## 2021-11-18 LAB — MICROSCOPIC EXAMINATION
Bacteria, UA: NONE SEEN
Renal Epithel, UA: NONE SEEN /hpf
WBC, UA: NONE SEEN /hpf (ref 0–5)

## 2021-11-18 NOTE — Progress Notes (Signed)
Assessment: 1. Ureteral calculus   2. Nephrolithiasis      Plan: I reviewed the KUB from today.  No obvious calcification seen along the expected course of either ureter. BMP today. Continue stone prevention. Schedule for renal ultrasound at [facility] in approximately 4 weeks. Return to office in 1 month after the ultrasound. We will discuss further evaluation with a 24-hour urine at his next visit.   Chief Complaint: Chief Complaint  Patient presents with   Nephrolithiasis    HPI: Charles Brooks is a 71 y.o. male who presents for continued evaluation of bilateral ureteral calculi. He presented to the emergency room on 10/14/2021 with acute onset of left-sided flank pain.  Evaluation with CT imaging demonstrated a 1.2 cm obstructing left mid ureteral calculus and a 1 cm obstructing right distal ureteral calculus.  The patient's creatinine was elevated from his baseline to 3.35.  He had subjective chills but no fever.  Urinalysis showed WBCs and bacteria but was nitrite negative.  Lactate was normal.   He underwent cystoscopy, bilateral retrograde pyelograms, right ureteroscopic laser lithotripsy, and insertion of a left ureteral stent.  The right ureteral calculus was removed. His creatinine improved to 2.98 on 10/15/2021.  He was discharged from the hospital at that time. KUB from 10/21/21 showed a 6 x 11 mm calcification at the level of L5 on the left consistent with the known left ureteral calculus. Cr 1.87 from 10/21/21.  He underwent left ESL on 10/26/2021.  KUB from 11/04/21 demonstrated bilateral ureteral stents in good position, possible small calcifications along the distal aspect of the left ureteral stent. He was not aware of passing any stones since the lithotripsy.   The bilateral ureteral stents were removed on 11/04/2021.  He returns today for follow-up.  He has done well since the stents were removed.  No flank pain.  He reports passing some small stone fragments.  No  dysuria or gross hematuria.     Portions of the above documentation were copied from a prior visit for review purposes only.  Allergies: Allergies  Allergen Reactions   Codeine Hives    PMH: Past Medical History:  Diagnosis Date   Arthritis    CAD (coronary artery disease), native coronary artery    DES RCA and PTCA RVM 07/2014, LVEF 55%   Cellulitis and abscess 08/08/2015   CKD (chronic kidney disease) stage 3, GFR 30-59 ml/min (HCC)    Essential hypertension    History of kidney stones    Hyperkalemia 07/06/2019   Hyperlipidemia    STEMI (ST elevation myocardial infarction) ([place])    07/2014    PSH: Past Surgical History:  Procedure Laterality Date   CHOLECYSTECTOMY     COLONOSCOPY N/A 02/09/2021   Procedure: COLONOSCOPY;  Surgeon: Aviva Signs, MD;  [facility];  Service: Gastroenterology;  Laterality: N/A;   COLONOSCOPY WITH PROPOFOL N/A 11/08/2013   Procedure: COLONOSCOPY WITH PROPOFOL;  Surgeon: [name], MD;  [facility];  Service: General;  Laterality: N/A;  in cecum at 0741; cecal withdrawal time = 10 min   CYSTOSCOPY W/ RETROGRADES Bilateral 10/14/2021   Procedure: CYSTOSCOPY WITH RETROGRADE PYELOGRAM;  Surgeon: Primus Bravo., MD;  [facility];  Service: Urology;  Laterality: Bilateral;   CYSTOSCOPY WITH STENT PLACEMENT Bilateral 10/14/2021   Procedure: CYSTOSCOPY WITH STENT PLACEMENT;  Surgeon: Primus Bravo., MD;  [facility];  Service: Urology;  Laterality: Bilateral;   EXTRACORPOREAL SHOCK WAVE LITHOTRIPSY  EXTRACORPOREAL SHOCK WAVE LITHOTRIPSY Right 09/22/2020   Procedure: EXTRACORPOREAL SHOCK WAVE LITHOTRIPSY (ESWL);  Surgeon: Cleon Gustin, MD;  [facility];  Service: Urology;  Laterality: Right;  cases not starting until 10:00 due to last minute add ons, need time to get covid tests back   EXTRACORPOREAL SHOCK WAVE LITHOTRIPSY Left 10/26/2021   Procedure: EXTRACORPOREAL SHOCK WAVE LITHOTRIPSY (ESWL);   Surgeon: Primus Bravo., MD;  [facility];  Service: Urology;  Laterality: Left;   INCISION AND DRAINAGE ABSCESS Right 08/12/2015   Procedure: INCISION AND DRAINAGE ABSCESS;  Surgeon: Aviva Signs, MD;  [facility];  Service: General;  Laterality: Right;   KNEE ARTHROSCOPY Right    LEFT HEART CATHETERIZATION WITH CORONARY ANGIOGRAM N/A 08/14/2014   Procedure: LEFT HEART CATHETERIZATION WITH CORONARY ANGIOGRAM;  Surgeon: Troy Sine, MD;  LAD 50-60%, CFX OK, OM1 80%(small), RCA 100>>0% w/ 2.25x24 mm Synergy DES, EF 55%   POLYPECTOMY N/A 11/08/2013   Procedure: POLYPECTOMY;  Surgeon: [name], MD;  [facility];  Service: General;  Laterality: N/A;  cecal polyp   POLYPECTOMY  02/09/2021   Procedure: POLYPECTOMY INTESTINAL;  Surgeon: Aviva Signs, MD;  [facility];  Service: Gastroenterology;;   Removal of kidney stones     Open   URETEROSCOPY WITH HOLMIUM LASER LITHOTRIPSY Right 10/14/2021   Procedure: URETEROSCOPY WITH HOLMIUM LASER LITHOTRIPSY;  Surgeon: Primus Bravo., MD;  [facility];  Service: Urology;  Laterality: Right;   WOUND DEBRIDEMENT Right 10/26/2015   Procedure: DEBRIDEMENT WOUND RIGHT FLANK;  Surgeon: Aviva Signs, MD;  [facility];  Service: General;  Laterality: Right;    SH: Social History   Tobacco Use   Smoking status: Former    Types: Cigars    Quit date: 11/05/2003    Years since quitting: 18.0   Smokeless tobacco: Never  Vaping Use   Vaping Use: Never used  Substance Use Topics   Alcohol use: No    Alcohol/week: 0.0 standard drinks   Drug use: No    ROS: Constitutional:  Negative for fever, chills, weight loss CV: Negative for chest pain, previous MI, hypertension Respiratory:  Negative for shortness of breath, wheezing, sleep apnea, frequent cough GI:  Negative for nausea, vomiting, bloody stool, GERD  PE: BP 124/66   Pulse 71  GENERAL APPEARANCE:  Well appearing, well developed, well nourished,  NAD HEENT:  Atraumatic, normocephalic, oropharynx clear NECK:  Supple without lymphadenopathy or thyromegaly ABDOMEN:  Soft, non-tender, no masses EXTREMITIES:  Moves all extremities well, without clubbing, cyanosis, or edema NEUROLOGIC:  Alert and oriented x 3, normal gait, CN II-XII grossly intact MENTAL STATUS:  appropriate BACK:  Non-tender to palpation, No CVAT SKIN:  Warm, dry, and intact   Results: U/A: 0-2 RBC

## 2021-11-19 LAB — BASIC METABOLIC PANEL
BUN/Creatinine Ratio: 11 (ref 10–24)
BUN: 21 mg/dL (ref 8–27)
CO2: 22 mmol/L (ref 20–29)
Calcium: 9.6 mg/dL (ref 8.6–10.2)
Chloride: 103 mmol/L (ref 96–106)
Creatinine, Ser: 1.87 mg/dL — ABNORMAL HIGH (ref 0.76–1.27)
Glucose: 113 mg/dL — ABNORMAL HIGH (ref 70–99)
Potassium: 4.5 mmol/L (ref 3.5–5.2)
Sodium: 138 mmol/L (ref 134–144)
eGFR: 38 mL/min/{1.73_m2} — ABNORMAL LOW (ref >59)

## 2021-11-19 NOTE — Progress Notes (Signed)
Letter sent informing pt.

## 2021-12-09 DIAGNOSIS — Z6825 Body mass index (BMI) 25.0-25.9, adult: Secondary | ICD-10-CM | POA: Diagnosis not present

## 2021-12-09 DIAGNOSIS — Z0000 Encounter for general adult medical examination without abnormal findings: Secondary | ICD-10-CM | POA: Diagnosis not present

## 2021-12-09 DIAGNOSIS — I10 Essential (primary) hypertension: Secondary | ICD-10-CM | POA: Diagnosis not present

## 2021-12-09 DIAGNOSIS — Z1331 Encounter for screening for depression: Secondary | ICD-10-CM | POA: Diagnosis not present

## 2021-12-09 DIAGNOSIS — Z125 Encounter for screening for malignant neoplasm of prostate: Secondary | ICD-10-CM | POA: Diagnosis not present

## 2021-12-09 DIAGNOSIS — E7800 Pure hypercholesterolemia, unspecified: Secondary | ICD-10-CM | POA: Diagnosis not present

## 2021-12-09 DIAGNOSIS — Z79899 Other long term (current) drug therapy: Secondary | ICD-10-CM | POA: Diagnosis not present

## 2021-12-09 DIAGNOSIS — Z1339 Encounter for screening examination for other mental health and behavioral disorders: Secondary | ICD-10-CM | POA: Diagnosis not present

## 2021-12-09 DIAGNOSIS — Z299 Encounter for prophylactic measures, unspecified: Secondary | ICD-10-CM | POA: Diagnosis not present

## 2021-12-09 DIAGNOSIS — R5383 Other fatigue: Secondary | ICD-10-CM | POA: Diagnosis not present

## 2021-12-09 DIAGNOSIS — Z7189 Other specified counseling: Secondary | ICD-10-CM | POA: Diagnosis not present

## 2021-12-16 ENCOUNTER — Ambulatory Visit (HOSPITAL_COMMUNITY)
Admission: RE | Admit: 2021-12-16 | Discharge: 2021-12-16 | Disposition: A | Discharge status: Home / Self Care | Payer: PPO | Source: Ambulatory Visit | Attending: Urology | Admitting: Urology

## 2021-12-16 DIAGNOSIS — N200 Calculus of kidney: Secondary | ICD-10-CM | POA: Insufficient documentation

## 2021-12-16 DIAGNOSIS — N201 Calculus of ureter: Secondary | ICD-10-CM | POA: Diagnosis not present

## 2021-12-16 DIAGNOSIS — N1330 Unspecified hydronephrosis: Secondary | ICD-10-CM | POA: Diagnosis not present

## 2021-12-23 ENCOUNTER — Ambulatory Visit (INDEPENDENT_AMBULATORY_CARE_PROVIDER_SITE_OTHER): Payer: PPO | Admitting: Urology

## 2021-12-23 ENCOUNTER — Encounter: Payer: Self-pay | Admitting: Urology

## 2021-12-23 VITALS — BP 121/69 | HR 51 | Ht 72.0 in | Wt 184.0 lb

## 2021-12-23 DIAGNOSIS — N200 Calculus of kidney: Secondary | ICD-10-CM

## 2021-12-23 DIAGNOSIS — N201 Calculus of ureter: Secondary | ICD-10-CM | POA: Diagnosis not present

## 2021-12-23 LAB — MICROSCOPIC EXAMINATION
Renal Epithel, UA: NONE SEEN /hpf
WBC, UA: NONE SEEN /hpf (ref 0–5)

## 2021-12-23 LAB — URINALYSIS, ROUTINE W REFLEX MICROSCOPIC
Bilirubin, UA: NEGATIVE
Glucose, UA: NEGATIVE
Ketones, UA: NEGATIVE
Leukocytes,UA: NEGATIVE
Nitrite, UA: NEGATIVE
Specific Gravity, UA: 1.02 (ref 1.005–1.030)
Urobilinogen, Ur: 0.2 mg/dL (ref 0.2–1.0)
pH, UA: 5 (ref 5.0–7.5)

## 2021-12-23 NOTE — Progress Notes (Signed)
Assessment: 1. Nephrolithiasis     Plan: Continue stone prevention. Metabolic evaluation with [id number] urine  Will contact him with results Return to office in 6 months  Chief Complaint: Chief Complaint  Patient presents with   Follow-up    1 MONTH FOLLOW UP   HPI: Charles Brooks is a 71 y.o. male who presents for continued evaluation of bilateral ureteral calculi. He presented to the emergency room on 10/14/2021 with acute onset of left-sided flank pain.  Evaluation with CT imaging demonstrated a 1.2 cm obstructing left mid ureteral calculus and a 1 cm obstructing right distal ureteral calculus.  The patient's creatinine was elevated from his baseline to 3.35.  He had subjective chills but no fever.  Urinalysis showed WBCs and bacteria but was nitrite negative.  Lactate was normal.   He underwent cystoscopy, bilateral retrograde pyelograms, right ureteroscopic laser lithotripsy, and insertion of a left ureteral stent.  The right ureteral calculus was removed. His creatinine improved to 2.98 on 10/15/2021.  He was discharged from the hospital at that time. KUB from 10/21/21 showed a 6 x 11 mm calcification at the level of L5 on the left consistent with the known left ureteral calculus. Cr 1.87 from 10/21/21.  He underwent left ESL on 10/26/2021.  KUB from 11/04/21 demonstrated bilateral ureteral stents in good position, possible small calcifications along the distal aspect of the left ureteral stent. He was not aware of passing any stones following the lithotripsy.   The bilateral ureteral stents were removed on 11/04/2021. He was doing well at his visit in 6/23. No flank pain.  He reported passing some small stone fragments.  No dysuria or gross hematuria.   KUB from 6/23  did not show any obvious calcifications along the expected course of either ureter and tiny bilateral renal calculi. BMP from 11/18/2021 showed a creatinine of 1.87.  Renal ultrasound from 12/17/2021 demonstrated no  evidence of hydronephrosis with some dilation of the left proximal ureter.  He returns today for follow-up.  He is not having any flank pain.  No dysuria or gross hematuria.  He has baseline urinary symptoms of urgency, frequency, and intermittent stream. IPSS = 19 today.  Portions of the above documentation were copied from a prior visit for review purposes only.  Allergies: Allergies  Allergen Reactions   Codeine Hives    PMH: Past Medical History:  Diagnosis Date   Arthritis    CAD (coronary artery disease), native coronary artery    DES RCA and PTCA RVM 07/2014, LVEF 55%   Cellulitis and abscess 08/08/2015   CKD (chronic kidney disease) stage 3, GFR 30-59 ml/min (HCC)    Essential hypertension    History of kidney stones    Hyperkalemia 07/06/2019   Hyperlipidemia    STEMI (ST elevation myocardial infarction) ([place])    07/2014    PSH: Past Surgical History:  Procedure Laterality Date   CHOLECYSTECTOMY     COLONOSCOPY N/A 02/09/2021   Procedure: COLONOSCOPY;  Surgeon: Aviva Signs, MD;  [facility];  Service: Gastroenterology;  Laterality: N/A;   COLONOSCOPY WITH PROPOFOL N/A 11/08/2013   Procedure: COLONOSCOPY WITH PROPOFOL;  Surgeon: [name], MD;  [facility];  Service: General;  Laterality: N/A;  in cecum at 0741; cecal withdrawal time = 10 min   CYSTOSCOPY W/ RETROGRADES Bilateral 10/14/2021   Procedure: CYSTOSCOPY WITH RETROGRADE PYELOGRAM;  Surgeon: Primus Bravo., MD;  [facility];  Service: Urology;  Laterality: Bilateral;   CYSTOSCOPY  WITH STENT PLACEMENT Bilateral 10/14/2021   Procedure: CYSTOSCOPY WITH STENT PLACEMENT;  Surgeon: Primus Bravo., MD;  [facility];  Service: Urology;  Laterality: Bilateral;   EXTRACORPOREAL SHOCK WAVE LITHOTRIPSY     EXTRACORPOREAL SHOCK WAVE LITHOTRIPSY Right 09/22/2020   Procedure: EXTRACORPOREAL SHOCK WAVE LITHOTRIPSY (ESWL);  Surgeon: Cleon Gustin, MD;  [facility];  Service:  Urology;  Laterality: Right;  cases not starting until 10:00 due to last minute add ons, need time to get covid tests back   EXTRACORPOREAL SHOCK WAVE LITHOTRIPSY Left 10/26/2021   Procedure: EXTRACORPOREAL SHOCK WAVE LITHOTRIPSY (ESWL);  Surgeon: Primus Bravo., MD;  [facility];  Service: Urology;  Laterality: Left;   INCISION AND DRAINAGE ABSCESS Right 08/12/2015   Procedure: INCISION AND DRAINAGE ABSCESS;  Surgeon: Aviva Signs, MD;  [facility];  Service: General;  Laterality: Right;   KNEE ARTHROSCOPY Right    LEFT HEART CATHETERIZATION WITH CORONARY ANGIOGRAM N/A 08/14/2014   Procedure: LEFT HEART CATHETERIZATION WITH CORONARY ANGIOGRAM;  Surgeon: Troy Sine, MD;  LAD 50-60%, CFX OK, OM1 80%(small), RCA 100>>0% w/ 2.25x24 mm Synergy DES, EF 55%   POLYPECTOMY N/A 11/08/2013   Procedure: POLYPECTOMY;  Surgeon: [name], MD;  [facility];  Service: General;  Laterality: N/A;  cecal polyp   POLYPECTOMY  02/09/2021   Procedure: POLYPECTOMY INTESTINAL;  Surgeon: Aviva Signs, MD;  [facility];  Service: Gastroenterology;;   Removal of kidney stones     Open   URETEROSCOPY WITH HOLMIUM LASER LITHOTRIPSY Right 10/14/2021   Procedure: URETEROSCOPY WITH HOLMIUM LASER LITHOTRIPSY;  Surgeon: Primus Bravo., MD;  [facility];  Service: Urology;  Laterality: Right;   WOUND DEBRIDEMENT Right 10/26/2015   Procedure: DEBRIDEMENT WOUND RIGHT FLANK;  Surgeon: Aviva Signs, MD;  Location: [facility];  Service: General;  Laterality: Right;    SH: Social History   Tobacco Use   Smoking status: Former    Types: Cigars    Quit date: 11/05/2003    Years since quitting: 18.1   Smokeless tobacco: Never  Vaping Use   Vaping Use: Never used  Substance Use Topics   Alcohol use: No    Alcohol/week: 0.0 standard drinks of alcohol   Drug use: No    ROS: Constitutional:  Negative for fever, chills, weight loss CV: Negative for chest pain, previous MI,  hypertension Respiratory:  Negative for shortness of breath, wheezing, sleep apnea, frequent cough GI:  Negative for nausea, vomiting, bloody stool, GERD  PE: BP 121/69   Pulse (!) 51   Ht 6' (1.829 m)   Wt 184 lb (83.5 kg)   BMI 24.95 kg/m  GENERAL APPEARANCE:  Well appearing, well developed, well nourished, NAD HEENT:  Atraumatic, normocephalic, oropharynx clear NECK:  Supple without lymphadenopathy or thyromegaly ABDOMEN:  Soft, non-tender, no masses EXTREMITIES:  Moves all extremities well, without clubbing, cyanosis, or edema NEUROLOGIC:  Alert and oriented x 3, normal gait, CN II-XII grossly intact MENTAL STATUS:  appropriate BACK:  Non-tender to palpation, No CVAT SKIN:  Warm, dry, and intact   Results: U/A: 0-2 RBCs, few bacteria

## 2021-12-31 DIAGNOSIS — Z299 Encounter for prophylactic measures, unspecified: Secondary | ICD-10-CM | POA: Diagnosis not present

## 2021-12-31 DIAGNOSIS — Z789 Other specified health status: Secondary | ICD-10-CM | POA: Diagnosis not present

## 2021-12-31 DIAGNOSIS — M25561 Pain in right knee: Secondary | ICD-10-CM | POA: Diagnosis not present

## 2021-12-31 DIAGNOSIS — I10 Essential (primary) hypertension: Secondary | ICD-10-CM | POA: Diagnosis not present

## 2022-01-07 DIAGNOSIS — M1712 Unilateral primary osteoarthritis, left knee: Secondary | ICD-10-CM | POA: Diagnosis not present

## 2022-01-07 DIAGNOSIS — I10 Essential (primary) hypertension: Secondary | ICD-10-CM | POA: Diagnosis not present

## 2022-01-07 DIAGNOSIS — M25562 Pain in left knee: Secondary | ICD-10-CM | POA: Diagnosis not present

## 2022-01-07 DIAGNOSIS — Z299 Encounter for prophylactic measures, unspecified: Secondary | ICD-10-CM | POA: Diagnosis not present

## 2022-01-19 ENCOUNTER — Ambulatory Visit (INDEPENDENT_AMBULATORY_CARE_PROVIDER_SITE_OTHER): Payer: PPO

## 2022-01-19 ENCOUNTER — Ambulatory Visit: Payer: Self-pay

## 2022-01-19 ENCOUNTER — Ambulatory Visit (INDEPENDENT_AMBULATORY_CARE_PROVIDER_SITE_OTHER): Payer: PPO | Admitting: Orthopedic Surgery

## 2022-01-19 ENCOUNTER — Encounter: Payer: Self-pay | Admitting: Orthopedic Surgery

## 2022-01-19 VITALS — Ht 72.0 in | Wt 184.0 lb

## 2022-01-19 DIAGNOSIS — M170 Bilateral primary osteoarthritis of knee: Secondary | ICD-10-CM

## 2022-01-19 DIAGNOSIS — G8929 Other chronic pain: Secondary | ICD-10-CM

## 2022-01-19 NOTE — Progress Notes (Signed)
New Patient Visit  Assessment: Charles Brooks is a 71 y.o. male with the following: 1. Primary osteoarthritis of both knees  Plan: [name] has arthritis in both knees.  Right is worse than the left.  His symptoms on the right, are worse than his left.  He has had improvement in his symptoms since receiving an injection with his PCP, in the last 1-2 weeks.  I explained to him that it is too early to repeat the injections today.  He is interested in hyaluronic acid injections, so we will work to get authorization prior to proceeding with HA injections.  He states his understanding.  Follow-up in approximately 3 months as needed.  Follow-up: Return if symptoms worsen or fail to improve.  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    History of Present Illness: Charles Brooks is a 71 y.o. male who has been referred by   Monico Blitz, MD for evaluation of bilateral knee pain.  He states he has pain in both of his knees.  Right is worse than the left.  No specific injury.  He states he had surgery on his right leg, greater than 50 years ago.  He had no issues following the surgery.  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  He has been getting injections from his primary care provider.  His last injection was within the last 1-2 weeks.  He discussed hyaluronic acid injections with his PCP, and was subsequently referred here, as these cannot be done in his regular clinic.   Review of Systems: No fevers or chills No numbness or tingling No chest pain No shortness of breath No bowel or bladder dysfunction No GI distress No headaches   Medical History:  Past Medical History:  Diagnosis Date   Arthritis    CAD (coronary artery disease), native coronary artery    DES RCA and PTCA RVM 07/2014, LVEF 55%    Cellulitis and abscess 08/08/2015   CKD (chronic kidney disease) stage 3, GFR 30-59 ml/min (HCC)    Essential hypertension    History of kidney stones    Hyperkalemia 07/06/2019   Hyperlipidemia    STEMI (ST elevation myocardial infarction) ([place])    07/2014    Past Surgical History:  Procedure Laterality Date   CHOLECYSTECTOMY     COLONOSCOPY N/A 02/09/2021   Procedure: COLONOSCOPY;  Surgeon: Aviva Signs, MD;  [facility];  Service: Gastroenterology;  Laterality: N/A;   COLONOSCOPY WITH PROPOFOL N/A 11/08/2013   Procedure: COLONOSCOPY WITH PROPOFOL;  Surgeon: [name], MD;  Location: [facility];  Service: General;  Laterality: N/A;  in cecum at 0741; cecal withdrawal time = 10 min   CYSTOSCOPY W/ RETROGRADES Bilateral 10/14/2021   Procedure: CYSTOSCOPY WITH RETROGRADE PYELOGRAM;  Surgeon: Primus Bravo., MD;  Location: [facility];  Service: Urology;  Laterality: Bilateral;   CYSTOSCOPY WITH STENT PLACEMENT Bilateral 10/14/2021   Procedure: CYSTOSCOPY WITH STENT PLACEMENT;  Surgeon: Primus Bravo., MD;  Location: [facility];  Service: Urology;  Laterality: Bilateral;   EXTRACORPOREAL SHOCK WAVE LITHOTRIPSY     EXTRACORPOREAL SHOCK WAVE LITHOTRIPSY Right 09/22/2020   Procedure: EXTRACORPOREAL SHOCK WAVE LITHOTRIPSY (ESWL);  Surgeon: Cleon Gustin, MD;  Location: [facility];  Service: Urology;  Laterality: Right;  cases not starting until 10:00 due to last minute add ons, need time to get covid tests back   EXTRACORPOREAL SHOCK WAVE LITHOTRIPSY Left 10/26/2021   Procedure: EXTRACORPOREAL SHOCK WAVE LITHOTRIPSY (ESWL);  Surgeon: Primus Bravo., MD;  [facility]: [facility];  Service: Urology;  Laterality: Left;   INCISION AND DRAINAGE ABSCESS Right 08/12/2015   Procedure: INCISION AND DRAINAGE ABSCESS;  Surgeon: Aviva Signs, MD;  Location: [facility];  Service: General;  Laterality: Right;   KNEE ARTHROSCOPY Right    LEFT HEART CATHETERIZATION WITH CORONARY ANGIOGRAM N/A 08/14/2014    Procedure: LEFT HEART CATHETERIZATION WITH CORONARY ANGIOGRAM;  Surgeon: Troy Sine, MD;  LAD 50-60%, CFX OK, OM1 80%(small), RCA 100>>0% w/ 2.25x24 mm Synergy DES, EF 55%   POLYPECTOMY N/A 11/08/2013   Procedure: POLYPECTOMY;  Surgeon: [name], MD;  [facility];  Service: General;  Laterality: N/A;  cecal polyp   POLYPECTOMY  02/09/2021   Procedure: POLYPECTOMY INTESTINAL;  Surgeon: Aviva Signs, MD;  [facility];  Service: Gastroenterology;;   Removal of kidney stones     Open   URETEROSCOPY WITH HOLMIUM LASER LITHOTRIPSY Right 10/14/2021   Procedure: URETEROSCOPY WITH HOLMIUM LASER LITHOTRIPSY;  Surgeon: Primus Bravo., MD;  [facility];  Service: Urology;  Laterality: Right;   WOUND DEBRIDEMENT Right 10/26/2015   Procedure: DEBRIDEMENT WOUND RIGHT FLANK;  Surgeon: Aviva Signs, MD;  Location: [facility];  Service: General;  Laterality: Right;    Family History  Problem Relation Age of Onset   Heart attack Brother    Social History   Tobacco Use   Smoking status: Former    Types: Cigars    Quit date: 11/05/2003    Years since quitting: 18.2   Smokeless tobacco: Never  Vaping Use   Vaping Use: Never used  Substance Use Topics   Alcohol use: No    Alcohol/week: 0.0 standard drinks of alcohol   Drug use: No    Allergies  Allergen Reactions   Codeine Hives    No outpatient medications have been marked as taking for the 01/19/22 encounter (Office Visit) with Mordecai Rasmussen, MD.    Objective: Ht 6' (1.829 m)   Wt 184 lb (83.5 kg)   BMI 24.95 kg/m   Physical Exam:  General: Elderly male. Gait: Slow, steady gait.  Right knee with varus alignment overall.  Mild varus alignment in the left knee.  Tenderness to palpation of the medial aspect of the right knee.  No increased laxity to varus or valgus stress.  Negative Lachman bilaterally.  Mild tenderness to palpation of the medial aspect of the left knee.  No swelling in either  knee.  IMAGING: I personally ordered and reviewed the following images  AP, lateral, tunnel and sunrise views of bilateral knees were obtained in clinic today.  Mild varus alignment of the right knee.  There is advanced loss of joint space within the medial compartment.  This is bone-on-bone.  There is some subchondral sclerosis within the medial femoral condyle.  On the lateral, there are some osteophytes projecting off the superior aspect of the patella, likely coming off the trochlea as well.  Left knee has mild loss of joint space within the medial compartment, without the associated osteophytes.  Neutral overall alignment.  Impression: Right knee x-rays with advanced, bone-on-bone degenerative changes, with mild degenerative changes within the left knee overall. New Medications:  No orders of the defined types were placed in this encounter.  Mordecai Rasmussen, MD  01/19/2022 11:34 PM

## 2022-02-15 ENCOUNTER — Encounter: Payer: Self-pay | Admitting: Cardiology

## 2022-02-15 DIAGNOSIS — Z299 Encounter for prophylactic measures, unspecified: Secondary | ICD-10-CM | POA: Diagnosis not present

## 2022-02-15 DIAGNOSIS — N1832 Chronic kidney disease, stage 3b: Secondary | ICD-10-CM | POA: Diagnosis not present

## 2022-02-15 DIAGNOSIS — M179 Osteoarthritis of knee, unspecified: Secondary | ICD-10-CM | POA: Diagnosis not present

## 2022-02-15 DIAGNOSIS — I10 Essential (primary) hypertension: Secondary | ICD-10-CM | POA: Diagnosis not present

## 2022-02-15 DIAGNOSIS — E039 Hypothyroidism, unspecified: Secondary | ICD-10-CM | POA: Diagnosis not present

## 2022-03-01 DIAGNOSIS — Z23 Encounter for immunization: Secondary | ICD-10-CM | POA: Diagnosis not present

## 2022-03-07 NOTE — Progress Notes (Unsigned)
Cardiology Office Note  Date: 03/08/2022   ID: [name], [name] [birth year], MRN [id number]  PCP:  [name], MD  Cardiologist:  Rozann Lesches, MD Electrophysiologist:  None   Chief Complaint  Patient presents with   Cardiac follow-up    History of Present Illness: Charles Brooks is a 71 y.o. male last seen in March.  He is here for a routine visit.  Remains functional with ADLs and yard work, no change in stamina since last encounter.  He does not report any angina or nitroglycerin use.  I reviewed his medications which are outlined below.  Also requesting interval lab work from PCP.  LDL was 52 last summer.  He has done well on Lipitor.  Past Medical History:  Diagnosis Date   Arthritis    CAD (coronary artery disease), native coronary artery    DES RCA and PTCA RVM 07/2014, LVEF 55%   Cellulitis and abscess 08/08/2015   CKD (chronic kidney disease) stage 3, GFR 30-59 ml/min (HCC)    Essential hypertension    History of kidney stones    Hyperkalemia 07/06/2019   Hyperlipidemia    STEMI (ST elevation myocardial infarction) ([place])    07/2014    Past Surgical History:  Procedure Laterality Date   CHOLECYSTECTOMY     COLONOSCOPY N/A 02/09/2021   Procedure: COLONOSCOPY;  Surgeon: Aviva Signs, MD;  [facility];  Service: Gastroenterology;  Laterality: N/A;   COLONOSCOPY WITH PROPOFOL N/A 11/08/2013   Procedure: COLONOSCOPY WITH PROPOFOL;  Surgeon: [name], MD;  [facility];  Service: General;  Laterality: N/A;  in cecum at 0741; cecal withdrawal time = 10 min   CYSTOSCOPY W/ RETROGRADES Bilateral 10/14/2021   Procedure: CYSTOSCOPY WITH RETROGRADE PYELOGRAM;  Surgeon: Primus Bravo., MD;  [facility];  Service: Urology;  Laterality: Bilateral;   CYSTOSCOPY WITH STENT PLACEMENT Bilateral 10/14/2021   Procedure: CYSTOSCOPY WITH STENT PLACEMENT;  Surgeon: Primus Bravo., MD;  [facility];  Service: Urology;  Laterality:  Bilateral;   EXTRACORPOREAL SHOCK WAVE LITHOTRIPSY     EXTRACORPOREAL SHOCK WAVE LITHOTRIPSY Right 09/22/2020   Procedure: EXTRACORPOREAL SHOCK WAVE LITHOTRIPSY (ESWL);  Surgeon: Cleon Gustin, MD;  [facility];  Service: Urology;  Laterality: Right;  cases not starting until 10:00 due to last minute add ons, need time to get covid tests back   EXTRACORPOREAL SHOCK WAVE LITHOTRIPSY Left 10/26/2021   Procedure: EXTRACORPOREAL SHOCK WAVE LITHOTRIPSY (ESWL);  Surgeon: Primus Bravo., MD;  [facility];  Service: Urology;  Laterality: Left;   INCISION AND DRAINAGE ABSCESS Right 08/12/2015   Procedure: INCISION AND DRAINAGE ABSCESS;  Surgeon: Aviva Signs, MD;  [facility];  Service: General;  Laterality: Right;   KNEE ARTHROSCOPY Right    LEFT HEART CATHETERIZATION WITH CORONARY ANGIOGRAM N/A 08/14/2014   Procedure: LEFT HEART CATHETERIZATION WITH CORONARY ANGIOGRAM;  Surgeon: Troy Sine, MD;  LAD 50-60%, CFX OK, OM1 80%(small), RCA 100>>0% w/ 2.25x24 mm Synergy DES, EF 55%   POLYPECTOMY N/A 11/08/2013   Procedure: POLYPECTOMY;  Surgeon: [name], MD;  [facility];  Service: General;  Laterality: N/A;  cecal polyp   POLYPECTOMY  02/09/2021   Procedure: POLYPECTOMY INTESTINAL;  Surgeon: Aviva Signs, MD;  [facility];  Service: Gastroenterology;;   Removal of kidney stones     Open   URETEROSCOPY WITH HOLMIUM LASER LITHOTRIPSY Right 10/14/2021   Procedure: URETEROSCOPY WITH HOLMIUM LASER LITHOTRIPSY;  Surgeon: Primus Bravo., MD;  [facility];  Service: Urology;  Laterality: Right;   WOUND DEBRIDEMENT Right 10/26/2015   Procedure: DEBRIDEMENT WOUND RIGHT FLANK;  Surgeon: Aviva Signs, MD;  Location: [facility];  Service: General;  Laterality: Right;    Current Outpatient Medications  Medication Sig Dispense Refill   aspirin 81 MG EC tablet Take 1 tablet (81 mg total) by mouth daily with breakfast. 30 tablet 1   atorvastatin (LIPITOR) 40 MG  tablet Take 1 tablet (40 mg total) by mouth daily. 10 tablet 0   gabapentin (NEURONTIN) 100 MG capsule Take 1 capsule (100 mg total) by mouth at bedtime.     metoprolol tartrate (LOPRESSOR) 25 MG tablet Take 25 mg by mouth 2 (two) times daily.     nitroGLYCERIN (NITROSTAT) 0.4 MG SL tablet Place 1 tablet (0.4 mg total) under the tongue every 5 (five) minutes x 3 doses as needed for chest pain. 25 tablet 12   vitamin B-12 (CYANOCOBALAMIN) 1000 MCG tablet Take 1,000 mcg by mouth daily.     zinc gluconate 50 MG tablet Take 50 mg by mouth daily.     No current facility-administered medications for this visit.   Allergies:  Codeine   ROS: No palpitations or syncope.  No claudication.  Physical Exam: VS:  BP (!) 148/72   Pulse 60   Ht 6' (1.829 m)   Wt 185 lb 12.8 oz (84.3 kg)   SpO2 97%   BMI 25.20 kg/m , BMI Body mass index is 25.2 kg/m.  Wt Readings from Last 3 Encounters:  03/08/22 185 lb 12.8 oz (84.3 kg)  01/19/22 184 lb (83.5 kg)  12/23/21 184 lb (83.5 kg)    General: Patient appears comfortable at rest. HEENT: Conjunctiva and lids normal. Neck: Supple, no elevated JVP or carotid bruits, no thyromegaly. Lungs: Clear to auscultation, nonlabored breathing at rest. Cardiac: Regular rate and rhythm, no S3 or significant systolic murmur. Extremities: No pitting edema.  ECG:  An ECG dated 09/02/2021 was personally reviewed today and demonstrated:  Sinus bradycardia with nonspecific ST-T changes.  Recent Labwork: June 2022: Cholesterol 103, triglycerides 132, HDL 27, LDL 52 10/14/2021: ALT 21; AST 16 10/15/2021: Hemoglobin 10.0; Magnesium 1.8; Platelets 131 11/18/2021: BUN 21; Creatinine, Ser 1.87; Potassium 4.5; Sodium 138   Other Studies Reviewed Today:  No interval cardiac testing for review today.  Assessment and Plan:  1.  CAD status post DES to the RCA and angioplasty of the RV marginal in 2016.  We have continued with observation on medical therapy in the absence of  angina symptoms.  No change in stamina, no nitroglycerin use in the interim.  Refill provided already for fresh bottle of nitroglycerin.  Continue aspirin, Lopressor, and Lipitor.  2.  Mixed hyperlipidemia on Lipitor.  Requesting interval lab work from PCP.  His last LDL was 52 in June 2022.  Medication Adjustments/Labs and Tests Ordered: Current medicines are reviewed at length with the patient today.  Concerns regarding medicines are outlined above.   Tests Ordered: No orders of the defined types were placed in this encounter.   Medication Changes: No orders of the defined types were placed in this encounter.   Disposition:  Follow up  6 months.  Signed, Satira Sark, MD, [facility] 03/08/2022 10:15 AM    [facility], [place], [place] [address] Phone: [phone]; Fax: [phone]

## 2022-03-08 ENCOUNTER — Ambulatory Visit: Payer: PPO | Attending: Cardiology | Admitting: Cardiology

## 2022-03-08 ENCOUNTER — Encounter: Payer: Self-pay | Admitting: *Deleted

## 2022-03-08 ENCOUNTER — Encounter: Payer: Self-pay | Admitting: Cardiology

## 2022-03-08 VITALS — BP 148/72 | HR 60 | Ht 72.0 in | Wt 185.8 lb

## 2022-03-08 DIAGNOSIS — E782 Mixed hyperlipidemia: Secondary | ICD-10-CM | POA: Diagnosis not present

## 2022-03-08 DIAGNOSIS — I25119 Atherosclerotic heart disease of native coronary artery with unspecified angina pectoris: Secondary | ICD-10-CM | POA: Diagnosis not present

## 2022-03-08 NOTE — Patient Instructions (Signed)

## 2022-05-17 DIAGNOSIS — I10 Essential (primary) hypertension: Secondary | ICD-10-CM | POA: Diagnosis not present

## 2022-05-17 DIAGNOSIS — M1990 Unspecified osteoarthritis, unspecified site: Secondary | ICD-10-CM | POA: Diagnosis not present

## 2022-05-17 DIAGNOSIS — Z299 Encounter for prophylactic measures, unspecified: Secondary | ICD-10-CM | POA: Diagnosis not present

## 2022-05-17 DIAGNOSIS — E039 Hypothyroidism, unspecified: Secondary | ICD-10-CM | POA: Diagnosis not present

## 2022-06-29 ENCOUNTER — Ambulatory Visit: Payer: PPO | Admitting: Urology

## 2022-07-21 DIAGNOSIS — H6692 Otitis media, unspecified, left ear: Secondary | ICD-10-CM | POA: Diagnosis not present

## 2022-07-21 DIAGNOSIS — Z299 Encounter for prophylactic measures, unspecified: Secondary | ICD-10-CM | POA: Diagnosis not present

## 2022-07-21 DIAGNOSIS — I10 Essential (primary) hypertension: Secondary | ICD-10-CM | POA: Diagnosis not present

## 2022-07-21 DIAGNOSIS — M25519 Pain in unspecified shoulder: Secondary | ICD-10-CM | POA: Diagnosis not present

## 2022-08-01 DIAGNOSIS — Z299 Encounter for prophylactic measures, unspecified: Secondary | ICD-10-CM | POA: Diagnosis not present

## 2022-08-01 DIAGNOSIS — C44209 Unspecified malignant neoplasm of skin of left ear and external auricular canal: Secondary | ICD-10-CM | POA: Diagnosis not present

## 2022-08-01 DIAGNOSIS — I700 Atherosclerosis of aorta: Secondary | ICD-10-CM | POA: Diagnosis not present

## 2022-08-01 DIAGNOSIS — I10 Essential (primary) hypertension: Secondary | ICD-10-CM | POA: Diagnosis not present

## 2022-08-01 DIAGNOSIS — H6692 Otitis media, unspecified, left ear: Secondary | ICD-10-CM | POA: Diagnosis not present

## 2022-08-20 IMAGING — CT CT RENAL STONE PROTOCOL
2 of 4 series · 15 of 46 positions shown, 17 images · non-contrast
Comparison: CT abdomen/pelvis August 09, 2015.

CLINICAL DATA: Nephrolithiasis



[Series 2: axial st · axial · 0.92mm/px · z∈[+771,+1281]mm · 12 of 118 slices shown, 14 images]
[im 8/118  soft-tissue]
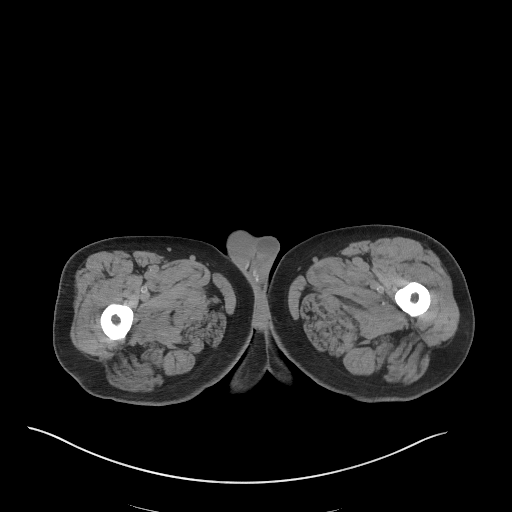
[im 8/118  bone]
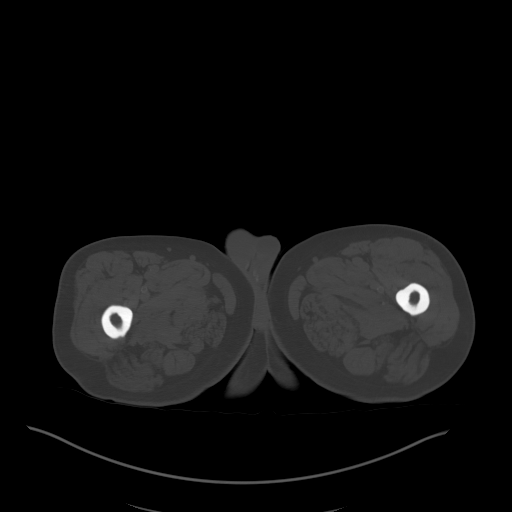
[im 15/118  soft-tissue]
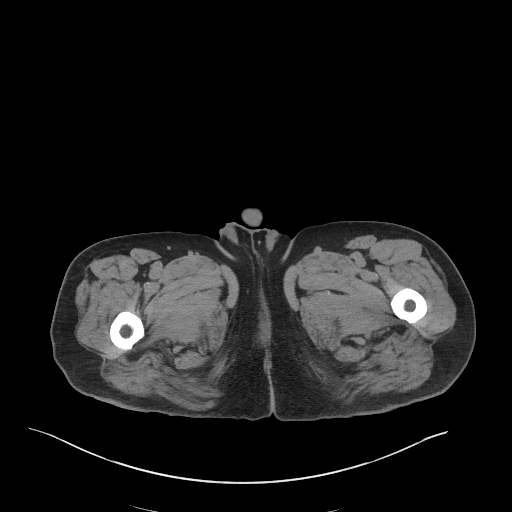
[im 30/118  soft-tissue]
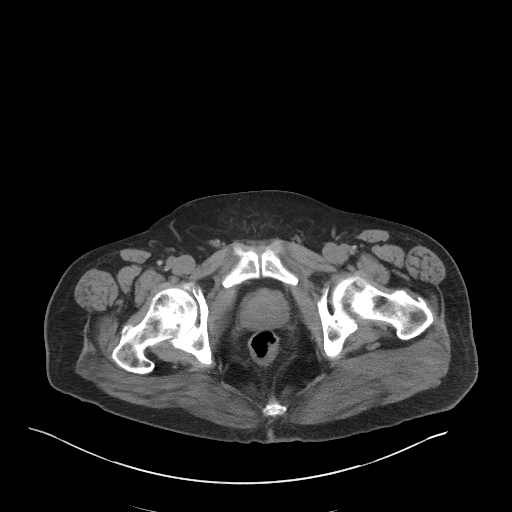
[im 37/118  soft-tissue]
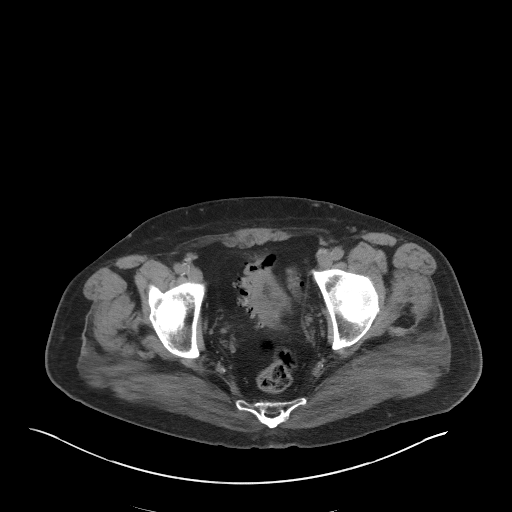
[im 44/118  soft-tissue]
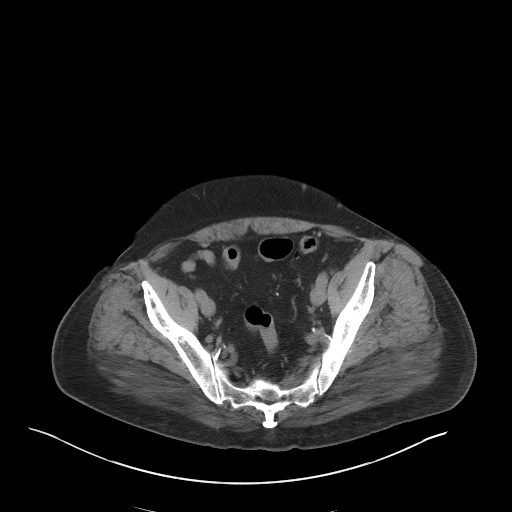
[im 52/118  soft-tissue]
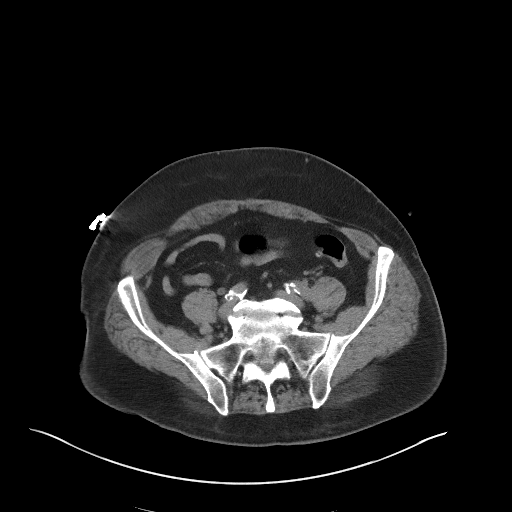
[im 66/118  soft-tissue]
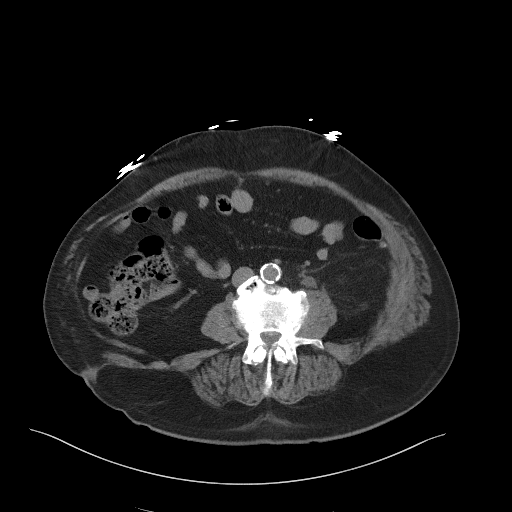
[im 74/118  soft-tissue]
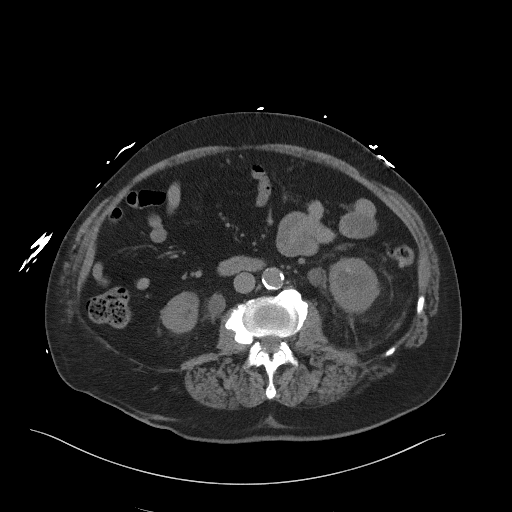
[im 81/118  soft-tissue]
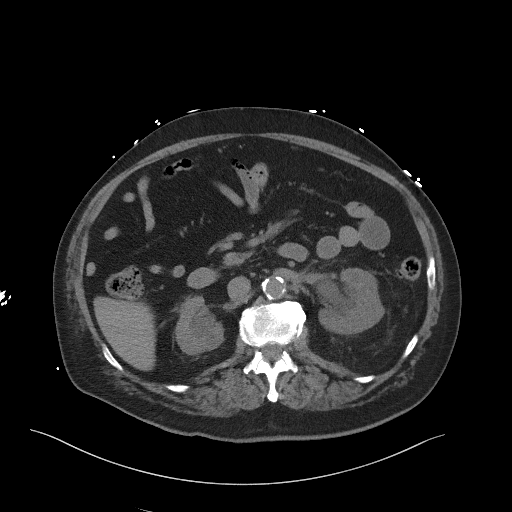
[im 81/118  bone]
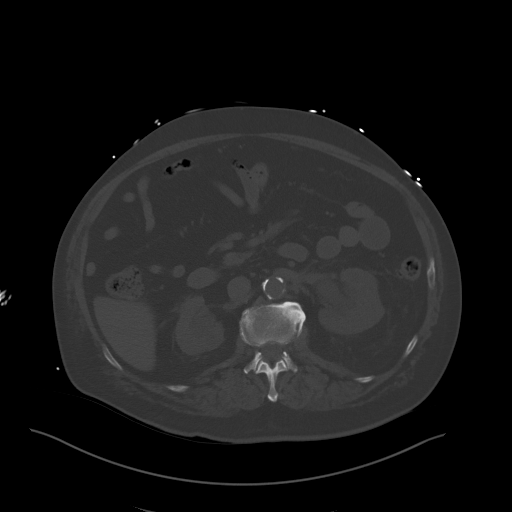
[im 88/118  soft-tissue]
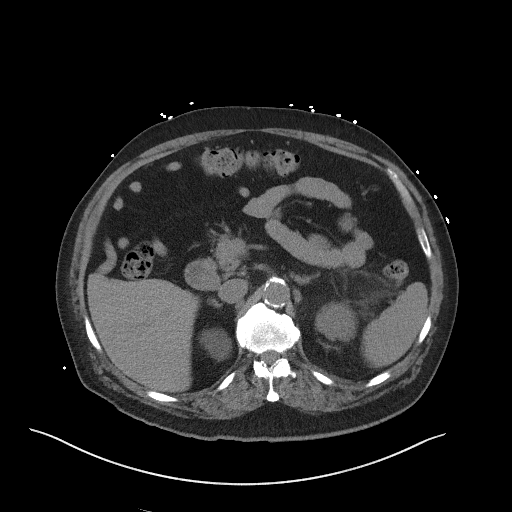
[im 103/118  soft-tissue]
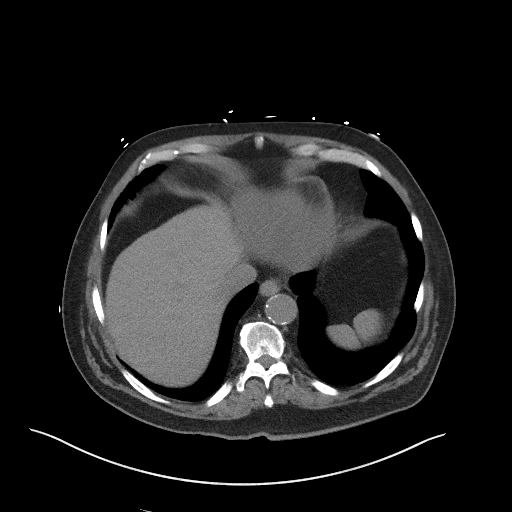
[im 110/118  soft-tissue]
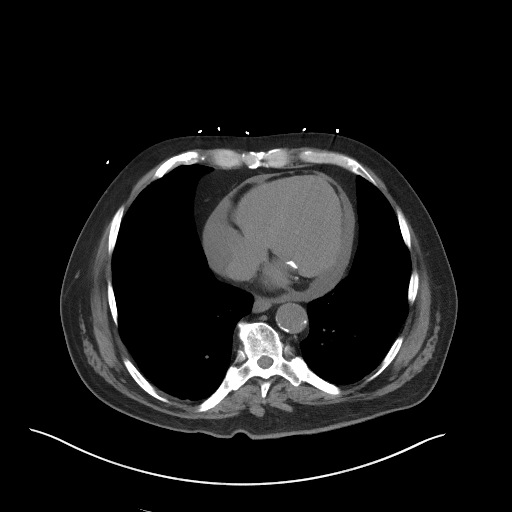

[Series 5: coronal st · coronal · 0.83mm/px · 3 of 119 slices shown]
[im 40/119  soft-tissue]
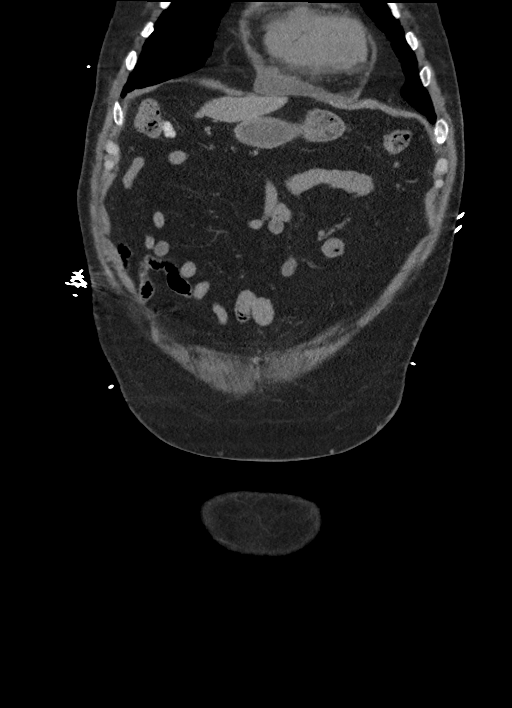
[im 53/119  soft-tissue]
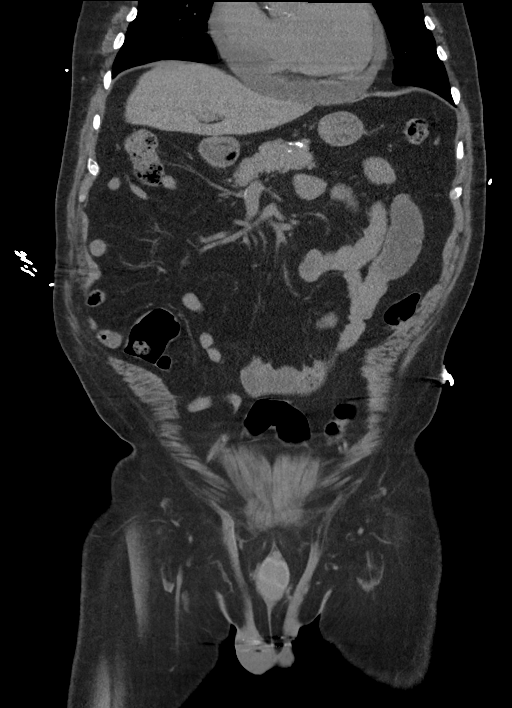
[im 66/119  soft-tissue]
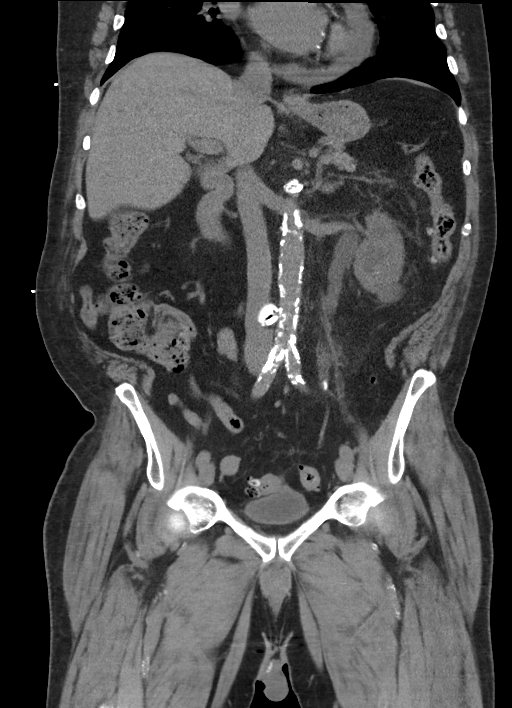

[15 of 46 positions shown; findings below may reference images not displayed]

FINDINGS: Lower chest: No acute abnormality.

Hepatobiliary: No focal liver abnormality is seen. Status post
cholecystectomy. No biliary dilatation.

Pancreas: Unremarkable. No pancreatic ductal dilatation or
surrounding inflammatory changes.

Spleen: Normal in size without focal abnormality.

Adrenals/Urinary Tract: Unremarkable adrenal glands. Approximately 1
cm obstructing calculus at the right ureterovesical junction (series
2, image 84). Approximately 1.2 cm obstructing calculus at in the
mid left ureter (series 5, image 68; series 2, image 65). Resulting
moderate left and mild right hydroureteronephrosis. Left perinephric
stranding. Additional smaller bilateral nonobstructing renal
calculi. Bilateral low-attenuation renal lesions, compatible with
cysts.

Stomach/Bowel: Stomach is within normal limits. Appendix appears
normal. No evidence of bowel wall thickening, distention, or
inflammatory changes.

Vascular/Lymphatic: Aortic atherosclerosis. No enlarged abdominal or
pelvic lymph nodes.

Reproductive: Prostate is unremarkable.

Other: No sizable abdominal wall hernia or abnormality. No
abdominopelvic ascites.

Musculoskeletal: Multilevel degenerative change in the visualized
spine. No evidence of acute fracture.
IMPRESSION: 1. Obstructing 1.2 cm left mid ureter calculus with moderate left
hydroureteronephrosis.
2. Obstructing 1.0 cm right UVJ calculus with mild right
hydroureteronephrosis.
3. Additional nonobstructing bilateral renal calculi.
4. Colonic diverticulosis.
5.  Aortic Atherosclerosis ([id number]).

## 2022-08-23 DIAGNOSIS — E039 Hypothyroidism, unspecified: Secondary | ICD-10-CM | POA: Diagnosis not present

## 2022-08-23 DIAGNOSIS — R21 Rash and other nonspecific skin eruption: Secondary | ICD-10-CM | POA: Diagnosis not present

## 2022-08-23 DIAGNOSIS — Z299 Encounter for prophylactic measures, unspecified: Secondary | ICD-10-CM | POA: Diagnosis not present

## 2022-08-23 DIAGNOSIS — I10 Essential (primary) hypertension: Secondary | ICD-10-CM | POA: Diagnosis not present

## 2022-08-23 DIAGNOSIS — C44209 Unspecified malignant neoplasm of skin of left ear and external auricular canal: Secondary | ICD-10-CM | POA: Diagnosis not present

## 2022-08-27 IMAGING — DX DG ABDOMEN 1V
2 series · 2 of 2 positions shown · non-contrast
Comparison: 09/22/2020

CLINICAL DATA: Left-sided flank pain for several days.

EXAM:
ABDOMEN - 1 VIEW

[abdomen kub (1 of 2)]
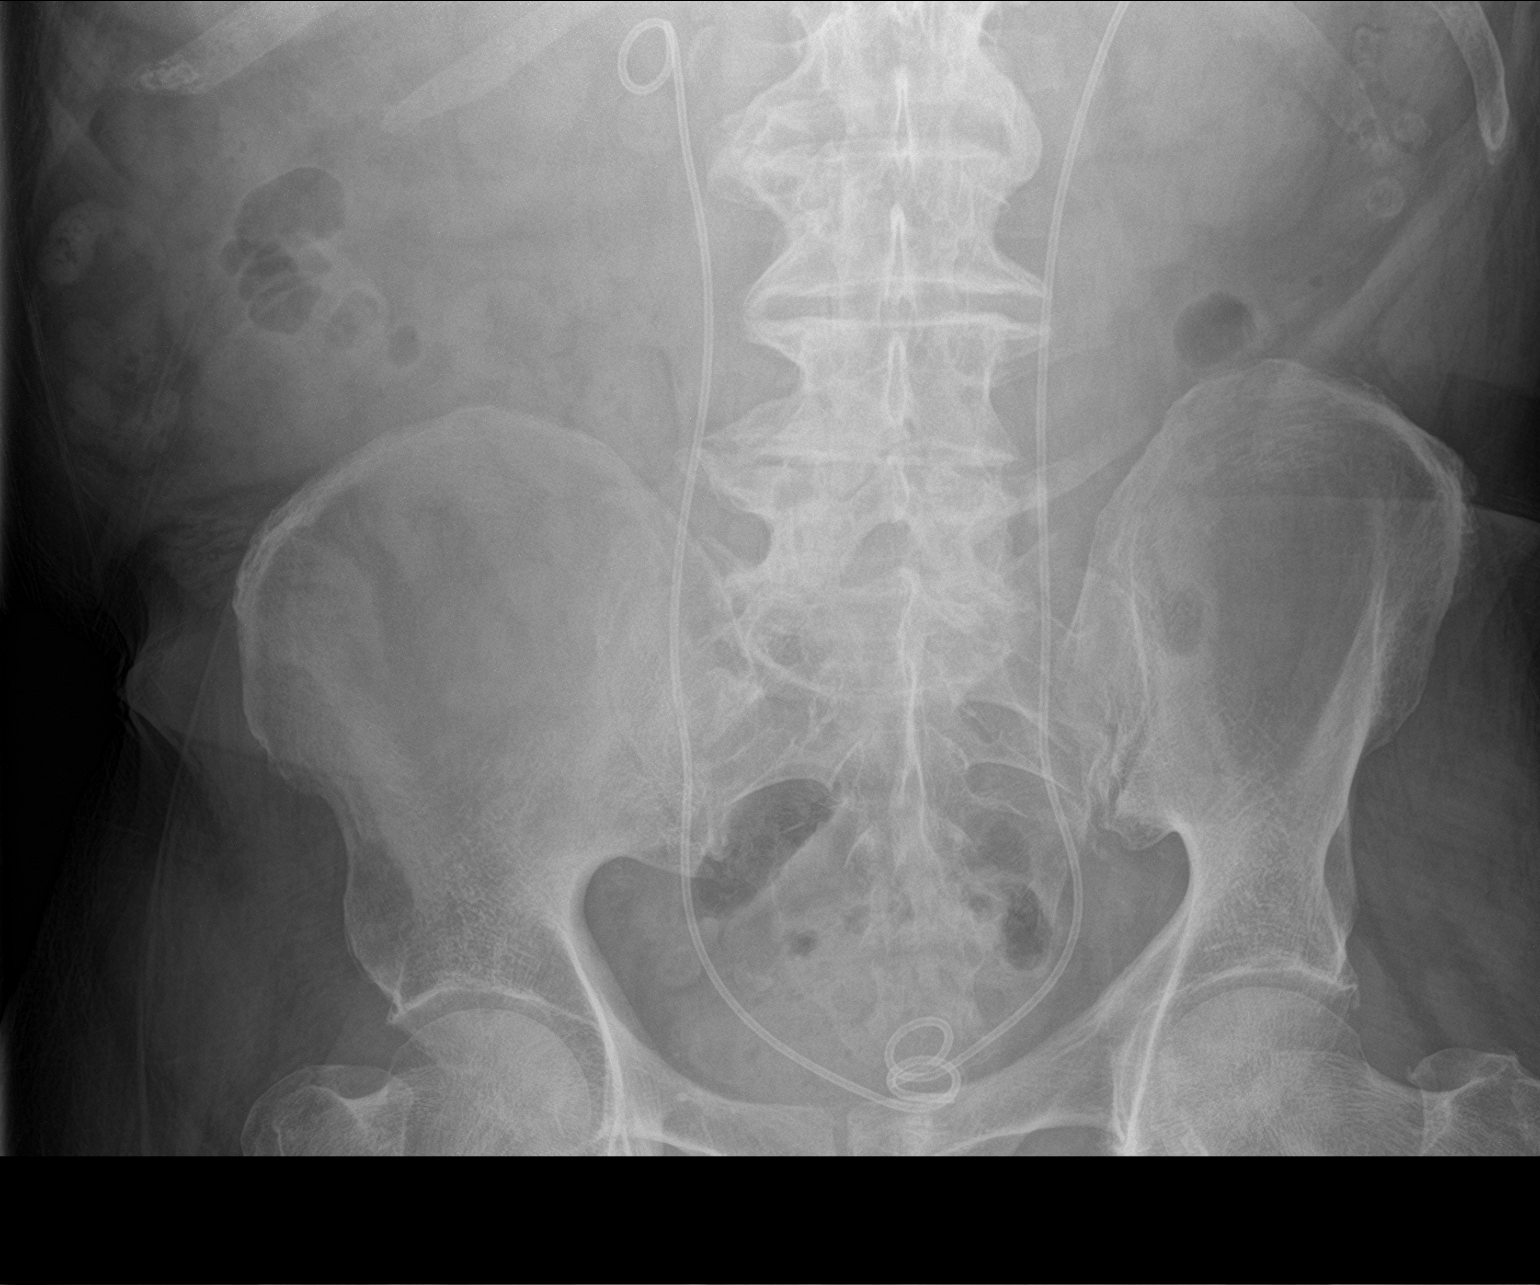

[abdomen kub (2 of 2)]
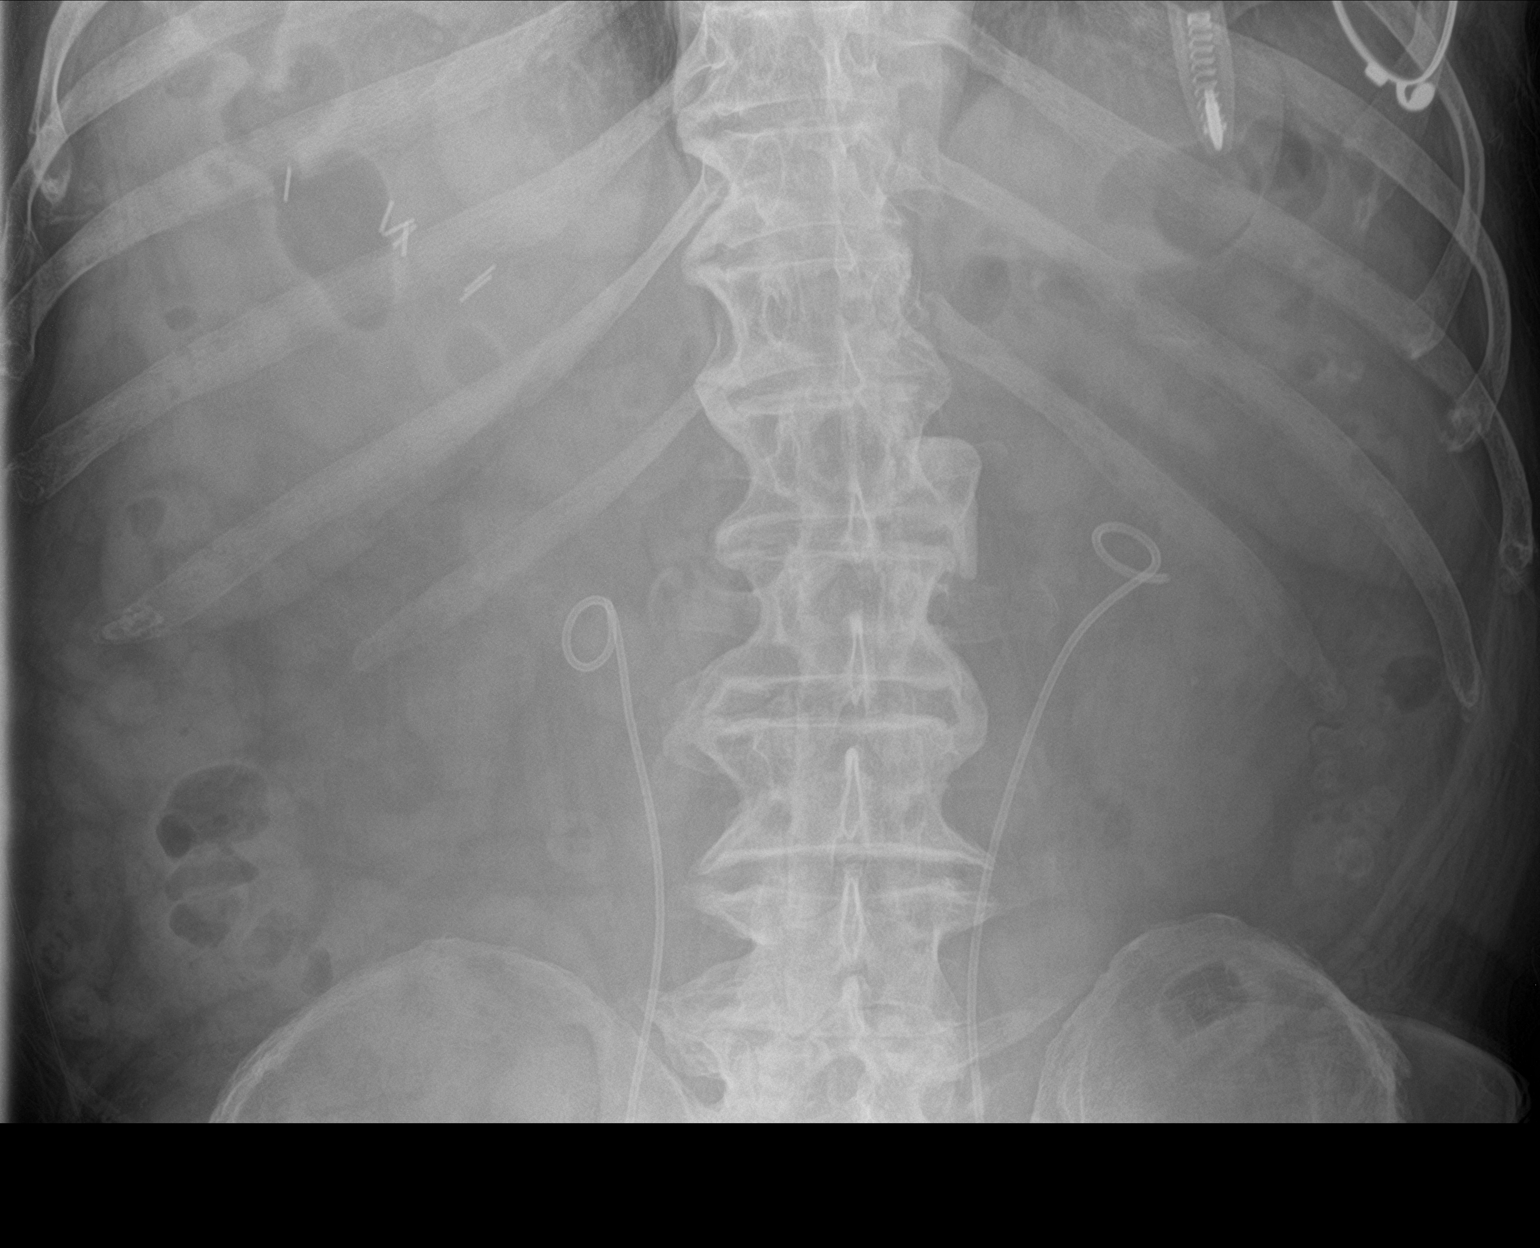

[2 of 2 positions shown; findings below may reference images not displayed]

FINDINGS: No bowel dilatation to suggest obstruction. No evidence of
pneumoperitoneum, portal venous gas or pneumatosis.

No pathologic calcifications along the expected course of the
ureters. Bilateral nephroureteral stents. Punctate left renal
calculus.

No acute osseous abnormality.
IMPRESSION: 1. Bilateral nephroureteral stents. Punctate left renal calculus.

## 2022-09-08 ENCOUNTER — Ambulatory Visit: Payer: PPO | Attending: Cardiology | Admitting: Cardiology

## 2022-09-08 ENCOUNTER — Encounter: Payer: Self-pay | Admitting: Cardiology

## 2022-09-08 VITALS — BP 115/58 | HR 73 | Ht 72.0 in | Wt 191.0 lb

## 2022-09-08 DIAGNOSIS — E782 Mixed hyperlipidemia: Secondary | ICD-10-CM | POA: Diagnosis not present

## 2022-09-08 DIAGNOSIS — I25119 Atherosclerotic heart disease of native coronary artery with unspecified angina pectoris: Secondary | ICD-10-CM

## 2022-09-08 NOTE — Progress Notes (Signed)
    Cardiology Office Note  Date: 09/08/2022   ID: Tomoki, Santy 03/14/1951, MRN [id number]  History of Present Illness: Charles Brooks is a 72 y.o. male last seen in September 2023.  He is here for a routine visit.  Since last encounter he does not report any significant chest discomfort, no nitroglycerin use.  Stable NYHA class II dyspnea.  I reviewed his medications, he reports no changes in plans to see his PCP this summer.  LDL in June of last year was 58 on Lipitor.  He has generally preferred a conservative approach to follow-up, we have held off on ischemic testing in the absence of increasing angina.  Physical Exam: VS:  BP (!) 115/58   Pulse 73   Ht 6' (1.829 m)   Wt 191 lb (86.6 kg)   SpO2 97%   BMI 25.90 kg/m , BMI Body mass index is 25.9 kg/m.  Wt Readings from Last 3 Encounters:  09/08/22 191 lb (86.6 kg)  03/08/22 185 lb 12.8 oz (84.3 kg)  01/19/22 184 lb (83.5 kg)    General: Patient appears comfortable at rest. HEENT: Conjunctiva and lids normal. Neck: Supple, no elevated JVP or carotid bruits. Lungs: Clear to auscultation, nonlabored breathing at rest. Cardiac: Regular rate and rhythm, no S3 or significant systolic murmur. Extremities: No pitting edema.  ECG:  An ECG dated 09/02/2021 was personally reviewed today and demonstrated:  Sinus bradycardia with nonspecific ST changes.  Labwork: 10/14/2021: ALT 21; AST 16 10/15/2021: Hemoglobin 10.0; Magnesium 1.8; Platelets 131 11/18/2021: BUN 21; Creatinine, Ser 1.87; Potassium 4.5; Sodium 138  June 2023: Cholesterol 109, triglycerides 97, HDL 32, LDL 58, BUN 22, creatinine 1.9, potassium 4.5, AST 16, ALT 02 February 2022: TSH 4.4  Other Studies Reviewed Today:  No interval cardiac testing for review today.  Assessment and Plan:  1.  CAD status post DES to the RCA and angioplasty of the RV marginal in 2016.  LVEF approximately 55% at that time.  He is doing well without active angina.  Continue aspirin,  Lopressor, Lipitor, and as needed nitroglycerin.  He continues to prefer observation over follow-up ischemic surveillance testing in the absence of symptoms.  2.  Mixed hyperlipidemia on Lipitor.  LDL 58 in June 2023.  Lab work with PCP this summer as scheduled.  Disposition:  Follow up  6 months.  Signed, Satira Sark, M.D., F.A.C.C.

## 2022-09-08 NOTE — Patient Instructions (Addendum)

## 2022-09-20 ENCOUNTER — Emergency Department (HOSPITAL_COMMUNITY)
Admission: EM | Admit: 2022-09-20 | Discharge: 2022-09-20 | Disposition: A | Discharge status: Home / Self Care | Payer: PPO | Attending: Emergency Medicine | Admitting: Emergency Medicine

## 2022-09-20 ENCOUNTER — Other Ambulatory Visit: Payer: Self-pay

## 2022-09-20 ENCOUNTER — Emergency Department (HOSPITAL_COMMUNITY): Payer: PPO

## 2022-09-20 ENCOUNTER — Encounter (HOSPITAL_COMMUNITY): Payer: Self-pay

## 2022-09-20 DIAGNOSIS — Y990 Civilian activity done for income or pay: Secondary | ICD-10-CM | POA: Diagnosis not present

## 2022-09-20 DIAGNOSIS — M25562 Pain in left knee: Secondary | ICD-10-CM | POA: Diagnosis not present

## 2022-09-20 DIAGNOSIS — M25512 Pain in left shoulder: Secondary | ICD-10-CM | POA: Diagnosis not present

## 2022-09-20 DIAGNOSIS — Z7982 Long term (current) use of aspirin: Secondary | ICD-10-CM | POA: Diagnosis not present

## 2022-09-20 DIAGNOSIS — W1830XA Fall on same level, unspecified, initial encounter: Secondary | ICD-10-CM | POA: Diagnosis not present

## 2022-09-20 DIAGNOSIS — W19XXXA Unspecified fall, initial encounter: Secondary | ICD-10-CM

## 2022-09-20 MED ORDER — ACETAMINOPHEN 500 MG PO TABS
1000.0000 mg | ORAL_TABLET | Freq: Once | ORAL | Status: DC
Start: 2022-09-20 — End: 2022-09-20
  Filled 2022-09-20: qty 2

## 2022-09-20 NOTE — ED Provider Notes (Signed)
[facility] Provider Note   CSN: [id number] Arrival date & time: 09/20/22  1500     History Chief Complaint  Patient presents with   Lytle Michaels    Charles Brooks is a 72 y.o. male.  Patient presents emergency department following a fall.  Reports left shoulder and left knee pain since his fall.  Reports that he is generally but his wife did not follow down and felt that he had a pop in his left knee now having limited range of motion left knee and left shoulder.  No prior significant injuries or surgeries to the sites.  No notable swelling or bruising has been observed either.  Patient has not take anything for pain prior to arriving to emergency department.   Fall       Home Medications Prior to Admission medications   Medication Sig Start Date End Date Taking? Authorizing Provider  aspirin 81 MG EC tablet Take 1 tablet (81 mg total) by mouth daily with breakfast. 07/07/19   Denton Brick, Courage, MD  atorvastatin (LIPITOR) 40 MG tablet Take 1 tablet (40 mg total) by mouth daily. 07/29/16   Satira Sark, MD  gabapentin (NEURONTIN) 100 MG capsule Take 1 capsule (100 mg total) by mouth at bedtime. 10/15/21   Johnson, Clanford L, MD  metoprolol tartrate (LOPRESSOR) 25 MG tablet Take 25 mg by mouth 2 (two) times daily.    [provider]  nitroGLYCERIN (NITROSTAT) 0.4 MG SL tablet Place 1 tablet (0.4 mg total) under the tongue every 5 (five) minutes x 3 doses as needed for chest pain. 02/26/21   Satira Sark, MD  vitamin B-12 (CYANOCOBALAMIN) 1000 MCG tablet Take 1,000 mcg by mouth daily.    [provider]  zinc gluconate 50 MG tablet Take 50 mg by mouth daily.    [provider]      Allergies    Codeine    Review of Systems   Review of Systems  Musculoskeletal:  Positive for joint swelling.  All other systems reviewed and are negative.   Physical Exam Updated Vital Signs BP (!) 131/92 (BP Location: Right  Arm)   Pulse 71   Temp 97.9 F (36.6 C) (Oral)   Resp 20   Ht 6' (1.829 m)   Wt 86.6 kg   SpO2 99%   BMI 25.90 kg/m  Physical Exam Vitals and nursing note reviewed.  Constitutional:      General: He is not in acute distress.    Appearance: He is well-developed.  HENT:     Head: Normocephalic and atraumatic.  Eyes:     Conjunctiva/sclera: Conjunctivae normal.  Cardiovascular:     Rate and Rhythm: Normal rate and regular rhythm.     Heart sounds: No murmur heard. Pulmonary:     Effort: Pulmonary effort is normal. No respiratory distress.     Breath sounds: Normal breath sounds.  Abdominal:     Palpations: Abdomen is soft.     Tenderness: There is no abdominal tenderness.  Musculoskeletal:        General: Tenderness present. No deformity.     Cervical back: Neck supple.     Comments: Limited range of motion in left shoulder and left knee due to recent fall. No obvious or gross deformity noted on my exam.  Skin:    General: Skin is warm and dry.     Capillary Refill: Capillary refill takes less than 2 seconds.  Neurological:  General: No focal deficit present.     Mental Status: He is alert. Mental status is at baseline.     Cranial Nerves: No cranial nerve deficit.     Motor: No weakness.  Psychiatric:        Mood and Affect: Mood normal.     ED Results / Procedures / Treatments   Labs (all labs ordered are listed, but only abnormal results are displayed) Labs Reviewed - No data to display  EKG None  Radiology DG Shoulder Left  Result Date: 09/20/2022 CLINICAL DATA:  Pain after fall EXAM: LEFT SHOULDER - 3 VIEW COMPARISON:  None Available. FINDINGS: No fracture or dislocation. Osteopenia. Minimal osteophytes along the AC joint. Preserved glenohumeral joint. IMPRESSION: Osteopenia.  Slight degenerative change Electronically Signed   By: Jill Side M.D.   On: 09/20/2022 17:04   DG Knee Complete 4 Views Left  Result Date: 09/20/2022 CLINICAL DATA:  Pain after  fall EXAM: LEFT KNEE - COMPLETE 4 VIEW COMPARISON:  None Available. FINDINGS: No fracture or dislocation. Preserved joint spaces. Small joint effusion on lateral view. Minimal osteophytes of the lateral compartment and patellofemoral joint. Vascular calcifications. IMPRESSION: Minimal degenerative changes.  Vascular calcifications. Electronically Signed   By: Jill Side M.D.   On: 09/20/2022 17:03    Procedures Procedures   Medications Ordered in ED Medications - No data to display  ED Course/ Medical Decision Making/ A&P                           Medical Decision Making Amount and/or Complexity of Data Reviewed Radiology: ordered.   This patient presents to the ED for concern of fall. Differential diagnosis includes shoulder dislocation, patellar dislocation, rotator cuff injury, osteoarthritis   Imaging Studies ordered:  I ordered imaging studies including xray of left shoulder and left knee  I independently visualized and interpreted imaging which showed no acute fractures or dislocations noted. I agree with the radiologist interpretation   Medicines ordered and prescription drug management:  I ordered medication including Tylenol  for pain  Reevaluation of the patient after these medicines showed that the patient improved I have reviewed the patients home medicines and have made adjustments as needed   Problem List / ED Course:  Patient presented to the ED for shoulder and knee pain following a fall. He reports he felt that his knee popped during this as he attempted to keep his wife from falling down. Now having some difficulty with bearing weight on left knee. Otherwise pain is minimal but limited ROM. Unfortunately cannot rule out other injuries such as rotator cuff tears or ligamental injuries to the knee, Advised patient to manage symptoms with OTC pain medications and plan on following up with orthopedics for further evaluation and potentially further imaging if  symptoms not improving with conservative management. Patient agreeable with this treatment plan and verbalized understanding all return precautions. All questions answered prior to patient discharge.  Final Clinical Impression(s) / ED Diagnoses Final diagnoses:  Acute pain of left shoulder  Acute pain of left knee  Fall, initial encounter    Rx / DC Orders ED Discharge Orders     None         Luvenia Heller, PA-C 09/21/22 0001    Cristie Hem, MD 09/21/22 1254

## 2022-09-20 NOTE — Discharge Instructions (Addendum)
You are seen in the emergency department following a fall.  Thankfully your x-rays of your left shoulder and left knee were reassuring without any signs of any fractures or dislocations at this time.  Will advise that he follow-up with Dr. Aline Brochure for orthopedics for another reassessment in the next couple days as he may likely need imaging done on your shoulder need to rule out any ligamental injury that may have occurred.  In the meantime I would recommend taking Tylenol for pain.  Given that you have a history of kidney disease I would not take ibuprofen or any other anti-inflammatory medication.

## 2022-09-20 NOTE — ED Triage Notes (Signed)
Pt presents with injuries to his L shoulder and L knee. Pt was working the yard and tried to keep his wife from falling. As pt fell over he heard a pop in his L knee. Pt unable to raise L shoulder above his head. Pt states he can move his L knee, but has been unable to stand on it.

## 2022-09-20 NOTE — ED Notes (Signed)
See triage notes. Ice applied to left shoulder and left knee.nad

## 2022-09-23 ENCOUNTER — Telehealth: Payer: Self-pay

## 2022-09-23 NOTE — Telephone Encounter (Signed)
     Patient  visit on 09/20/2022  at [facility] was for fall.  Have you been able to follow up with your primary care physician? Yes  The patient was or was not able to obtain any needed medicine or equipment. Patient is able to obtain  medication.  Are there diet recommendations that you are having difficulty following? No  Patient expresses understanding of discharge instructions and education provided has no other needs at this time. Yes   Charles Brooks [facility]  [facility] Population Health Community Resource Care Guide   [email or web address]  ?? [phone]   Website: [email or web address]  [email or web address]

## 2022-10-01 DIAGNOSIS — M25562 Pain in left knee: Secondary | ICD-10-CM | POA: Diagnosis not present

## 2022-10-01 DIAGNOSIS — M1711 Unilateral primary osteoarthritis, right knee: Secondary | ICD-10-CM | POA: Diagnosis not present

## 2022-10-01 DIAGNOSIS — S76192A Other specified injury of left quadriceps muscle, fascia and tendon, initial encounter: Secondary | ICD-10-CM | POA: Diagnosis not present

## 2022-10-01 DIAGNOSIS — M25561 Pain in right knee: Secondary | ICD-10-CM | POA: Diagnosis not present

## 2022-10-04 ENCOUNTER — Other Ambulatory Visit (HOSPITAL_COMMUNITY): Payer: Self-pay | Admitting: Orthopedic Surgery

## 2022-10-04 DIAGNOSIS — M25512 Pain in left shoulder: Secondary | ICD-10-CM

## 2022-10-04 DIAGNOSIS — S76192A Other specified injury of left quadriceps muscle, fascia and tendon, initial encounter: Secondary | ICD-10-CM | POA: Diagnosis not present

## 2022-10-05 ENCOUNTER — Other Ambulatory Visit: Payer: Self-pay

## 2022-10-05 ENCOUNTER — Encounter (HOSPITAL_COMMUNITY): Payer: Self-pay

## 2022-10-05 ENCOUNTER — Inpatient Hospital Stay (HOSPITAL_COMMUNITY)
Admission: RE | Admit: 2022-10-05 | Discharge: 2022-10-05 | Disposition: A | Discharge status: Home / Self Care | Payer: PPO | Source: Ambulatory Visit

## 2022-10-05 HISTORY — DX: Aneurysm of unspecified site: I72.9

## 2022-10-05 NOTE — Patient Instructions (Addendum)
SURGICAL WAITING ROOM VISITATION  Patients having surgery or a procedure may have no more than 2 support people in the waiting area - these visitors may rotate.    Children under the age of 67 must have an adult with them who is not the patient.  Due to an increase in RSV and influenza rates and associated hospitalizations, children ages 20 and under may not visit patients in [facility] hospitals.  If the patient needs to stay at the hospital during part of their recovery, the visitor guidelines for inpatient rooms apply. Pre-op nurse will coordinate an appropriate time for 1 support person to accompany patient in pre-op.  This support person may not rotate.    Please refer to the [facility] website for the visitor guidelines for Inpatients (after your surgery is over and you are in a regular room).       Your procedure is scheduled on:  10/10/22    [facility] Main Entrance    Report to admitting at  215  pm    Call this number if you have problems the morning of surgery [phone]   Do not eat food :After Midnight.   After Midnight you may have the following liquids until _ 115 pm    DAY OF SURGERY  Water Non-Citrus Juices (without pulp, NO RED-Apple, White grape, White cranberry) Black Coffee (NO MILK/CREAM OR CREAMERS, sugar ok)  Clear Tea (NO MILK/CREAM OR CREAMERS, sugar ok) regular and decaf                             Plain Jell-O (NO RED)                                           Fruit ices (not with fruit pulp, NO RED)                                     Popsicles (NO RED)                                                               Sports drinks like Gatorade (NO RED)                    The day of surgery:  Drink ONE (1) Pre-Surgery Clear Ensure or G2 at    115 pm  the  day  of surgery. Drink in one sitting. Do not sip.  This drink was given to you during your hospital  pre-op appointment visit. Nothing else to drink after completing the   Pre-Surgery Clear Ensure or G2.          If you have questions, please contact your surgeon's office.     Oral Hygiene is also important to reduce your risk of infection.                                    Remember - BRUSH YOUR TEETH THE MORNING OF  SURGERY WITH YOUR REGULAR TOOTHPASTE  DENTURES WILL BE REMOVED PRIOR TO SURGERY PLEASE DO NOT APPLY "Poly grip" OR ADHESIVES!!!   Do NOT smoke after Midnight   Take these medicines the morning of surgery with A SIP OF WATER:  metoprolol, synthroid   DO NOT TAKE ANY ORAL DIABETIC MEDICATIONS DAY OF YOUR SURGERY  Bring CPAP mask and tubing day of surgery.                              You may not have any metal on your body including hair pins, jewelry, and body piercing             Do not wear make-up, lotions, powders, perfumes/cologne, or deodorant  Do not wear nail polish including gel and S&S, artificial/acrylic nails, or any other type of covering on natural nails including finger and toenails. If you have artificial nails, gel coating, etc. that needs to be removed by a nail salon please have this removed prior to surgery or surgery may need to be canceled/ delayed if the surgeon/ anesthesia feels like they are unable to be safely monitored.   Do not shave  48 hours prior to surgery.               Men may shave face and neck.   Do not bring valuables to the hospital. [facility] IS NOT             RESPONSIBLE   FOR VALUABLES.   Contacts, glasses, dentures or bridgework may not be worn into surgery.   Bring small overnight bag day of surgery.   DO NOT BRING YOUR HOME MEDICATIONS TO THE HOSPITAL. PHARMACY WILL DISPENSE MEDICATIONS LISTED ON YOUR MEDICATION LIST TO YOU DURING YOUR ADMISSION IN THE HOSPITAL!    Patients discharged on the day of surgery will not be allowed to drive home.  Someone NEEDS to stay with you for the first 24 hours after anesthesia.   Special Instructions: Bring a copy of your healthcare power of  attorney and living will documents the day of surgery if you haven't scanned them before.              Please read over the following fact sheets you were given: IF YOU HAVE QUESTIONS ABOUT YOUR PRE-OP INSTRUCTIONS PLEASE CALL [phone]   If you received a COVID test during your pre-op visit  it is requested that you wear a mask when out in public, stay away from anyone that may not be feeling well and notify your surgeon if you develop symptoms. If you test positive for Covid or have been in contact with anyone that has tested positive in the last 10 days please notify you surgeon.    [facility] - Preparing for Surgery Before surgery, you can play an important role.  Because skin is not sterile, your skin needs to be as free of germs as possible.  You can reduce the number of germs on your skin by washing with CHG (chlorahexidine gluconate) soap before surgery.  CHG is an antiseptic cleaner which kills germs and bonds with the skin to continue killing germs even after washing. Please DO NOT use if you have an allergy to CHG or antibacterial soaps.  If your skin becomes reddened/irritated stop using the CHG and inform your nurse when you arrive at Short Stay. Do not shave (including legs and underarms) for at least 48 hours prior to the  first CHG shower.  You may shave your face/neck. Please follow these instructions carefully:  1.  Shower with CHG Soap the night before surgery and the  morning of Surgery.  2.  If you choose to wash your hair, wash your hair first as usual with your  normal  shampoo.  3.  After you shampoo, rinse your hair and body thoroughly to remove the  shampoo.                           4.  Use CHG as you would any other liquid soap.  You can apply chg directly  to the skin and wash                       Gently with a scrungie or clean washcloth.  5.  Apply the CHG Soap to your body ONLY FROM THE NECK DOWN.   Do not use on face/ open                           Wound or open  sores. Avoid contact with eyes, ears mouth and genitals (private parts).                       Wash face,  Genitals (private parts) with your normal soap.             6.  Wash thoroughly, paying special attention to the area where your surgery  will be performed.  7.  Thoroughly rinse your body with warm water from the neck down.  8.  DO NOT shower/wash with your normal soap after using and rinsing off  the CHG Soap.                9.  Pat yourself dry with a clean towel.            10.  Wear clean pajamas.            11.  Place clean sheets on your bed the night of your first shower and do not  sleep with pets. Day of Surgery : Do not apply any lotions/deodorants the morning of surgery.  Please wear clean clothes to the hospital/surgery center.  FAILURE TO FOLLOW THESE INSTRUCTIONS MAY RESULT IN THE CANCELLATION OF YOUR SURGERY PATIENT SIGNATURE_________________________________  NURSE SIGNATURE__________________________________  ________________________________________________________________________

## 2022-10-05 NOTE — Progress Notes (Signed)
Anesthesia Review:  PCP: DR Sherril Croon  Cardiologist : DR Simona Huh LOV 09/08/22  Chest x-ray :  EKG : 09/08/22  Echo : Stress test: Cardiac Cath :  Activity level: can do a flight of stairs without difficutly  Sleep Study/ CPAP : none  Fasting Blood Sugar :      / Checks Blood Sugar -- times a day:   Blood Thinner/ Instructions /Last Dose: ASA / Instructions/ Last Dose :    81 mg aspirin    Fall on 09/20/22   PT has MRI scheduled for 4pm on 10/06/22 for shoulder and knee at [facility].  Lab appt on 10/07/22 at 1100am- Tamika to release labs.   Hx and instructions completed via phone on 10/05/22.  Bag in lab.  `````````````````````````````````````````````````````````````````````````````````````````````````````````````````````````````````````````````````````````````````````````````````````````````````````````````

## 2022-10-06 ENCOUNTER — Ambulatory Visit (HOSPITAL_COMMUNITY)
Admission: RE | Admit: 2022-10-06 | Discharge: 2022-10-06 | Disposition: A | Discharge status: Home / Self Care | Payer: PPO | Source: Ambulatory Visit | Attending: Orthopedic Surgery | Admitting: Orthopedic Surgery

## 2022-10-06 DIAGNOSIS — M25512 Pain in left shoulder: Secondary | ICD-10-CM | POA: Diagnosis not present

## 2022-10-06 DIAGNOSIS — S46012A Strain of muscle(s) and tendon(s) of the rotator cuff of left shoulder, initial encounter: Secondary | ICD-10-CM | POA: Diagnosis not present

## 2022-10-07 ENCOUNTER — Encounter (HOSPITAL_COMMUNITY)
Admission: RE | Admit: 2022-10-07 | Discharge: 2022-10-07 | Disposition: A | Discharge status: Home / Self Care | Payer: PPO | Source: Ambulatory Visit | Attending: Orthopedic Surgery | Admitting: Orthopedic Surgery

## 2022-10-07 ENCOUNTER — Other Ambulatory Visit: Payer: Self-pay

## 2022-10-07 ENCOUNTER — Encounter (HOSPITAL_COMMUNITY): Payer: Self-pay | Admitting: Orthopedic Surgery

## 2022-10-07 DIAGNOSIS — Z01812 Encounter for preprocedural laboratory examination: Secondary | ICD-10-CM | POA: Insufficient documentation

## 2022-10-07 DIAGNOSIS — Z87891 Personal history of nicotine dependence: Secondary | ICD-10-CM | POA: Diagnosis not present

## 2022-10-07 DIAGNOSIS — S76112A Strain of left quadriceps muscle, fascia and tendon, initial encounter: Secondary | ICD-10-CM | POA: Diagnosis not present

## 2022-10-07 DIAGNOSIS — N183 Chronic kidney disease, stage 3 unspecified: Secondary | ICD-10-CM | POA: Insufficient documentation

## 2022-10-07 DIAGNOSIS — I2510 Atherosclerotic heart disease of native coronary artery without angina pectoris: Secondary | ICD-10-CM | POA: Diagnosis not present

## 2022-10-07 DIAGNOSIS — X58XXXA Exposure to other specified factors, initial encounter: Secondary | ICD-10-CM | POA: Insufficient documentation

## 2022-10-07 DIAGNOSIS — Z01818 Encounter for other preprocedural examination: Secondary | ICD-10-CM

## 2022-10-07 DIAGNOSIS — I129 Hypertensive chronic kidney disease with stage 1 through stage 4 chronic kidney disease, or unspecified chronic kidney disease: Secondary | ICD-10-CM | POA: Diagnosis not present

## 2022-10-07 LAB — BASIC METABOLIC PANEL
Anion gap: 8 (ref 5–15)
BUN: 36 mg/dL — ABNORMAL HIGH (ref 8–23)
CO2: 21 mmol/L — ABNORMAL LOW (ref 22–32)
Calcium: 9.1 mg/dL (ref 8.9–10.3)
Chloride: 106 mmol/L (ref 98–111)
Creatinine, Ser: 2.01 mg/dL — ABNORMAL HIGH (ref 0.61–1.24)
GFR, Estimated: 35 mL/min — ABNORMAL LOW (ref >60)
Glucose, Bld: 90 mg/dL (ref 70–99)
Potassium: 4.7 mmol/L (ref 3.5–5.1)
Sodium: 135 mmol/L (ref 135–145)

## 2022-10-07 LAB — CBC
HCT: 40.6 % (ref 39.0–52.0)
Hemoglobin: 12.7 g/dL — ABNORMAL LOW (ref 13.0–17.0)
MCH: 32.2 pg (ref 26.0–34.0)
MCHC: 31.3 g/dL (ref 30.0–36.0)
MCV: 103 fL — ABNORMAL HIGH (ref 80.0–100.0)
Platelets: 243 10*3/uL (ref 150–400)
RBC: 3.94 MIL/uL — ABNORMAL LOW (ref 4.22–5.81)
RDW: 13.2 % (ref 11.5–15.5)
WBC: 12.9 10*3/uL — ABNORMAL HIGH (ref 4.0–10.5)
nRBC: 0 % (ref 0.0–0.2)

## 2022-10-07 NOTE — Progress Notes (Signed)
PCP - [name], MD Cardiologist - Nona Dell. Last seen on 09/08/2022 EKG - 09/08/2022 Chest x-ray - Denies SOB, chx pain ECHO - Denies Cardiac Cath - 08/14/2013 CPAP - Denies  DM- Denies   Blood Thinner Instructions: Denies Aspirin Instructions: Follow tour MD instructions.   ERAS Protcol - Yes. Last drink at 1130 COVID TEST- N/A. Pt denies any symptoms.   Anesthesia review: Yes, cardiac hx  -------------  SDW INSTRUCTIONS:  Your procedure is scheduled on Monday April 22. Please report to [facility] Main Entrance "A" at 1200 P.M., and check in at the Admitting office. Call this number if you have problems the morning of surgery: [phone]   Remember: Do not eat  after midnight the night before your surgery  You may drink clear liquids until 1130 the morning of your surgery.   Clear liquids allowed are: Water, Non-Citrus Juices (without pulp), Carbonated Beverages, Clear Tea, Black Coffee Only, and Gatorade   Medications to take morning of surgery with a sip of water include: atorvastatin (LIPITOR)  levothyroxine (SYNTHROID)  metoprolol tartrate (LOPRESSOR)   IF NEEDED nitroGLYCERIN (NITROSTAT)   As of today, STOP taking any Aspirin (unless otherwise instructed by your surgeon), Aleve, Naproxen, Ibuprofen, Motrin, Advil, Goody's, BC's, all herbal medications, fish oil, and all vitamins.    The Morning of Surgery Do not wear jewelry. Do not wear lotions, powders,colognes, or deodorant Do not bring valuables to the hospital. [facility] is not responsible for any belongings or valuables.  If you are a smoker, DO NOT Smoke 24 hours prior to surgery  If you wear a CPAP at night please bring your mask the morning of surgery   Remember that you must have someone to transport you home after your surgery, and remain with you for 24 hours if you are discharged the same day.  Please bring cases for contacts, glasses, hearing aids, dentures or bridgework because it  cannot be worn into surgery.   Patients discharged the day of surgery will not be allowed to drive home.   Please shower the NIGHT BEFORE/MORNING OF SURGERY (use antibacterial soap like DIAL soap if possible). Wear comfortable clothes the morning of surgery. Oral Hygiene is also important to reduce your risk of infection.  Remember - BRUSH YOUR TEETH THE MORNING OF SURGERY WITH YOUR REGULAR TOOTHPASTE  Patient denies shortness of breath, fever, cough and chest pain.

## 2022-10-07 NOTE — Progress Notes (Signed)
Anesthesia Chart Review  Case: [id number] Date/Time: 10/10/22 1415   Procedure: OPEN REPAIR QUADRICEP TENDON (Left) - 90   Anesthesia type: Choice   Pre-op diagnosis: Left knee quadriceps tear   [facility] OR ROOM 04 / [facility] OR   Surgeons: Yolonda Kida, MD       DISCUSSION:72 y.o. former smoker with h/o HTN, CAD (DES 2016), CKD Stage III, left knee quadriceps tear scheduled for above procedure 10/10/22 with Dr. Duwayne Heck.   Pt last seen by cardio 09/08/22. Per OV note, "He is doing well without active angina. Continue aspirin, Lopressor, Lipitor, and as needed nitroglycerin. He continues to prefer observation over follow-up ischemic surveillance testing in the absence of symptoms."  Anticipate pt can proceed with planned procedure barring acute status change.   VS: BP 116/69 (BP Location: Left Arm)   Pulse 63   Temp 36.8 C (Oral)   Resp 16   Ht 6' (1.829 m)   Wt 86.2 kg   SpO2 99%   BMI 25.77 kg/m   PROVIDERS: [name], MD is PCP   Nona Dell, MD is Cardiologist  LABS: Labs reviewed: Acceptable for surgery. (all labs ordered are listed, but only abnormal results are displayed)  Labs Reviewed  CBC - Abnormal; Notable for the following components:      Result Value   WBC 12.9 (*)    RBC 3.94 (*)    Hemoglobin 12.7 (*)    MCV 103.0 (*)    All other components within normal limits  BASIC METABOLIC PANEL - Abnormal; Notable for the following components:   CO2 21 (*)    BUN 36 (*)    Creatinine, Ser 2.01 (*)    GFR, Estimated 35 (*)    All other components within normal limits     IMAGES:   EKG:   CV:  Past Medical History:  Diagnosis Date   Aneurysm    in head per pt that sealed itself in 2007   Arthritis    CAD (coronary artery disease), native coronary artery    DES RCA and PTCA RVM 07/2014, LVEF 55%   Cellulitis and abscess 08/08/2015   CKD (chronic kidney disease) stage 3, GFR 30-59 ml/min    Essential hypertension    History of  kidney stones    Hyperkalemia 07/06/2019   Hyperlipidemia    STEMI (ST elevation myocardial infarction)    07/2014    Past Surgical History:  Procedure Laterality Date   CHOLECYSTECTOMY     COLONOSCOPY N/A 02/09/2021   Procedure: COLONOSCOPY;  Surgeon: Franky Macho, MD;  Location: [facility] ENDO SUITE;  Service: Gastroenterology;  Laterality: N/A;   COLONOSCOPY WITH PROPOFOL N/A 11/08/2013   Procedure: COLONOSCOPY WITH PROPOFOL;  Surgeon: Dalia Heading, MD;  Location: [facility] ORS;  Service: General;  Laterality: N/A;  in cecum at 0741; cecal withdrawal time = 10 min   coronary stents      CYSTOSCOPY W/ RETROGRADES Bilateral 10/14/2021   Procedure: CYSTOSCOPY WITH RETROGRADE PYELOGRAM;  Surgeon: Milderd Meager., MD;  Location: [facility] ORS;  Service: Urology;  Laterality: Bilateral;   CYSTOSCOPY WITH STENT PLACEMENT Bilateral 10/14/2021   Procedure: CYSTOSCOPY WITH STENT PLACEMENT;  Surgeon: Milderd Meager., MD;  Location: [facility] ORS;  Service: Urology;  Laterality: Bilateral;   EXTRACORPOREAL SHOCK WAVE LITHOTRIPSY     EXTRACORPOREAL SHOCK WAVE LITHOTRIPSY Right 09/22/2020   Procedure: EXTRACORPOREAL SHOCK WAVE LITHOTRIPSY (ESWL);  Surgeon: Malen Gauze, MD;  Location: [facility] ORS;  Service: Urology;  Laterality: Right;  cases not starting until 10:00 due to last minute add ons, need time to get covid tests back   EXTRACORPOREAL SHOCK WAVE LITHOTRIPSY Left 10/26/2021   Procedure: EXTRACORPOREAL SHOCK WAVE LITHOTRIPSY (ESWL);  Surgeon: Milderd Meager., MD;  Location: [facility] ORS;  Service: Urology;  Laterality: Left;   INCISION AND DRAINAGE ABSCESS Right 08/12/2015   Procedure: INCISION AND DRAINAGE ABSCESS;  Surgeon: Franky Macho, MD;  Location: [facility] ORS;  Service: General;  Laterality: Right;   KNEE ARTHROSCOPY Right    LEFT HEART CATHETERIZATION WITH CORONARY ANGIOGRAM N/A 08/14/2014   Procedure: LEFT HEART CATHETERIZATION WITH CORONARY ANGIOGRAM;  Surgeon: Lennette Bihari, MD;  LAD 50-60%, CFX  OK, OM1 80%(small), RCA 100>>0% w/ 2.25x24 mm Synergy DES, EF 55%   POLYPECTOMY N/A 11/08/2013   Procedure: POLYPECTOMY;  Surgeon: Dalia Heading, MD;  Location: [facility] ORS;  Service: General;  Laterality: N/A;  cecal polyp   POLYPECTOMY  02/09/2021   Procedure: POLYPECTOMY INTESTINAL;  Surgeon: Franky Macho, MD;  Location: [facility] ENDO SUITE;  Service: Gastroenterology;;   Removal of kidney stones     Open   URETEROSCOPY WITH HOLMIUM LASER LITHOTRIPSY Right 10/14/2021   Procedure: URETEROSCOPY WITH HOLMIUM LASER LITHOTRIPSY;  Surgeon: Milderd Meager., MD;  Location: [facility] ORS;  Service: Urology;  Laterality: Right;   WOUND DEBRIDEMENT Right 10/26/2015   Procedure: DEBRIDEMENT WOUND RIGHT FLANK;  Surgeon: Franky Macho, MD;  Location: [facility] ORS;  Service: General;  Laterality: Right;    MEDICATIONS:  aspirin 81 MG EC tablet   atorvastatin (LIPITOR) 40 MG tablet   gabapentin (NEURONTIN) 100 MG capsule   levothyroxine (SYNTHROID) 50 MCG tablet   meloxicam (MOBIC) 7.5 MG tablet   metoprolol tartrate (LOPRESSOR) 25 MG tablet   nitroGLYCERIN (NITROSTAT) 0.4 MG SL tablet   vitamin B-12 (CYANOCOBALAMIN) 1000 MCG tablet   zinc gluconate 50 MG tablet   No current facility-administered medications for this encounter.    Jodell Cipro Ward, PA-C [facility] Pre-Surgical Testing [phone]

## 2022-10-10 ENCOUNTER — Ambulatory Visit (HOSPITAL_COMMUNITY): Payer: PPO | Admitting: Physician Assistant

## 2022-10-10 ENCOUNTER — Encounter (HOSPITAL_COMMUNITY)
Admission: RE | Disposition: A | Discharge status: Skilled Nursing Facility | Payer: Self-pay | Source: Home / Self Care | Attending: Orthopedic Surgery

## 2022-10-10 ENCOUNTER — Encounter (HOSPITAL_COMMUNITY): Payer: Self-pay | Admitting: Orthopedic Surgery

## 2022-10-10 ENCOUNTER — Inpatient Hospital Stay (HOSPITAL_COMMUNITY)
Admission: RE | Admit: 2022-10-10 | Discharge: 2022-10-13 | DRG: 502 | Disposition: A | Discharge status: Skilled Nursing Facility | Payer: PPO | Attending: Orthopedic Surgery | Admitting: Orthopedic Surgery

## 2022-10-10 ENCOUNTER — Other Ambulatory Visit: Payer: Self-pay

## 2022-10-10 ENCOUNTER — Ambulatory Visit (HOSPITAL_BASED_OUTPATIENT_CLINIC_OR_DEPARTMENT_OTHER): Payer: PPO | Admitting: Anesthesiology

## 2022-10-10 DIAGNOSIS — I252 Old myocardial infarction: Secondary | ICD-10-CM

## 2022-10-10 DIAGNOSIS — S76112A Strain of left quadriceps muscle, fascia and tendon, initial encounter: Secondary | ICD-10-CM | POA: Diagnosis present

## 2022-10-10 DIAGNOSIS — Z9049 Acquired absence of other specified parts of digestive tract: Secondary | ICD-10-CM

## 2022-10-10 DIAGNOSIS — S76119A Strain of unspecified quadriceps muscle, fascia and tendon, initial encounter: Secondary | ICD-10-CM | POA: Diagnosis present

## 2022-10-10 DIAGNOSIS — Z87891 Personal history of nicotine dependence: Secondary | ICD-10-CM

## 2022-10-10 DIAGNOSIS — Z955 Presence of coronary angioplasty implant and graft: Secondary | ICD-10-CM | POA: Diagnosis not present

## 2022-10-10 DIAGNOSIS — E785 Hyperlipidemia, unspecified: Secondary | ICD-10-CM | POA: Diagnosis present

## 2022-10-10 DIAGNOSIS — M1990 Unspecified osteoarthritis, unspecified site: Secondary | ICD-10-CM | POA: Diagnosis present

## 2022-10-10 DIAGNOSIS — R41841 Cognitive communication deficit: Secondary | ICD-10-CM | POA: Diagnosis not present

## 2022-10-10 DIAGNOSIS — Z791 Long term (current) use of non-steroidal anti-inflammatories (NSAID): Secondary | ICD-10-CM

## 2022-10-10 DIAGNOSIS — Z9181 History of falling: Secondary | ICD-10-CM | POA: Diagnosis not present

## 2022-10-10 DIAGNOSIS — Z01818 Encounter for other preprocedural examination: Secondary | ICD-10-CM

## 2022-10-10 DIAGNOSIS — I10 Essential (primary) hypertension: Secondary | ICD-10-CM

## 2022-10-10 DIAGNOSIS — I2510 Atherosclerotic heart disease of native coronary artery without angina pectoris: Secondary | ICD-10-CM | POA: Diagnosis present

## 2022-10-10 DIAGNOSIS — Z7982 Long term (current) use of aspirin: Secondary | ICD-10-CM

## 2022-10-10 DIAGNOSIS — W19XXXA Unspecified fall, initial encounter: Secondary | ICD-10-CM | POA: Diagnosis present

## 2022-10-10 DIAGNOSIS — E039 Hypothyroidism, unspecified: Secondary | ICD-10-CM | POA: Diagnosis present

## 2022-10-10 DIAGNOSIS — M6281 Muscle weakness (generalized): Secondary | ICD-10-CM | POA: Diagnosis not present

## 2022-10-10 DIAGNOSIS — R2689 Other abnormalities of gait and mobility: Secondary | ICD-10-CM | POA: Diagnosis not present

## 2022-10-10 DIAGNOSIS — S76112D Strain of left quadriceps muscle, fascia and tendon, subsequent encounter: Secondary | ICD-10-CM | POA: Diagnosis not present

## 2022-10-10 DIAGNOSIS — G8918 Other acute postprocedural pain: Secondary | ICD-10-CM | POA: Diagnosis not present

## 2022-10-10 DIAGNOSIS — Z79899 Other long term (current) drug therapy: Secondary | ICD-10-CM

## 2022-10-10 DIAGNOSIS — Z79890 Hormone replacement therapy (postmenopausal): Secondary | ICD-10-CM | POA: Diagnosis not present

## 2022-10-10 DIAGNOSIS — R296 Repeated falls: Secondary | ICD-10-CM | POA: Diagnosis present

## 2022-10-10 DIAGNOSIS — N183 Chronic kidney disease, stage 3 unspecified: Secondary | ICD-10-CM | POA: Diagnosis not present

## 2022-10-10 DIAGNOSIS — S76192A Other specified injury of left quadriceps muscle, fascia and tendon, initial encounter: Secondary | ICD-10-CM | POA: Diagnosis not present

## 2022-10-10 HISTORY — DX: Hypothyroidism, unspecified: E03.9

## 2022-10-10 HISTORY — PX: QUADRICEPS TENDON REPAIR: SHX756

## 2022-10-10 LAB — CBC
HCT: 35.7 % — ABNORMAL LOW (ref 39.0–52.0)
Hemoglobin: 11.6 g/dL — ABNORMAL LOW (ref 13.0–17.0)
MCH: 32.4 pg (ref 26.0–34.0)
MCHC: 32.5 g/dL (ref 30.0–36.0)
MCV: 99.7 fL (ref 80.0–100.0)
Platelets: 223 10*3/uL (ref 150–400)
RBC: 3.58 MIL/uL — ABNORMAL LOW (ref 4.22–5.81)
RDW: 13 % (ref 11.5–15.5)
WBC: 9.2 10*3/uL (ref 4.0–10.5)
nRBC: 0 % (ref 0.0–0.2)

## 2022-10-10 LAB — CREATININE, SERUM
Creatinine, Ser: 1.86 mg/dL — ABNORMAL HIGH (ref 0.61–1.24)
GFR, Estimated: 38 mL/min — ABNORMAL LOW (ref >60)

## 2022-10-10 LAB — SURGICAL PCR SCREEN
MRSA, PCR: NEGATIVE
Staphylococcus aureus: POSITIVE — AB

## 2022-10-10 SURGERY — REPAIR, TENDON, QUADRICEPS
Anesthesia: General | Laterality: Left

## 2022-10-10 MED ORDER — NITROGLYCERIN 0.4 MG SL SUBL: 0.4000 mg | SUBLINGUAL_TABLET | SUBLINGUAL | Status: DC | PRN

## 2022-10-10 MED ORDER — LACTATED RINGERS IV SOLN: INTRAVENOUS | Status: DC

## 2022-10-10 MED ORDER — ACETAMINOPHEN 500 MG PO TABS
1000.0000 mg | ORAL_TABLET | Freq: Once | ORAL | Status: AC
  Administered 2022-10-10: 1000 mg via ORAL
  Filled 2022-10-10: qty 2

## 2022-10-10 MED ORDER — ONDANSETRON HCL 4 MG/2ML IJ SOLN: 4.0000 mg | Freq: Four times a day (QID) | INTRAMUSCULAR | Status: DC | PRN

## 2022-10-10 MED ORDER — ONDANSETRON HCL 4 MG/2ML IJ SOLN
INTRAMUSCULAR | Status: DC | PRN
  Administered 2022-10-10: 4 mg via INTRAVENOUS

## 2022-10-10 MED ORDER — EPHEDRINE SULFATE-NACL 50-0.9 MG/10ML-% IV SOSY
PREFILLED_SYRINGE | INTRAVENOUS | Status: DC | PRN
  Administered 2022-10-10 (×4): 5 mg via INTRAVENOUS

## 2022-10-10 MED ORDER — ACETAMINOPHEN 325 MG PO TABS: 325.0000 mg | ORAL_TABLET | Freq: Four times a day (QID) | ORAL | Status: DC | PRN

## 2022-10-10 MED ORDER — ENOXAPARIN SODIUM 40 MG/0.4ML IJ SOSY
40.0000 mg | PREFILLED_SYRINGE | INTRAMUSCULAR | Status: DC
  Administered 2022-10-11 – 2022-10-13 (×3): 40 mg via SUBCUTANEOUS
  Filled 2022-10-10 (×3): qty 0.4

## 2022-10-10 MED ORDER — ASPIRIN 81 MG PO TBEC
81.0000 mg | DELAYED_RELEASE_TABLET | Freq: Every day | ORAL | Status: DC
  Administered 2022-10-11 – 2022-10-13 (×3): 81 mg via ORAL
  Filled 2022-10-10 (×3): qty 1

## 2022-10-10 MED ORDER — ORAL CARE MOUTH RINSE: 15.0000 mL | Freq: Once | OROMUCOSAL | Status: DC

## 2022-10-10 MED ORDER — ONDANSETRON HCL 4 MG PO TABS: 4.0000 mg | ORAL_TABLET | Freq: Four times a day (QID) | ORAL | Status: DC | PRN

## 2022-10-10 MED ORDER — METOPROLOL TARTRATE 25 MG PO TABS
25.0000 mg | ORAL_TABLET | Freq: Two times a day (BID) | ORAL | Status: DC
  Administered 2022-10-10 – 2022-10-13 (×6): 25 mg via ORAL
  Filled 2022-10-10 (×6): qty 1

## 2022-10-10 MED ORDER — FENTANYL CITRATE (PF) 250 MCG/5ML IJ SOLN
INTRAMUSCULAR | Status: DC | PRN
  Administered 2022-10-10 (×4): 25 ug via INTRAVENOUS

## 2022-10-10 MED ORDER — LEVOTHYROXINE SODIUM 50 MCG PO TABS
50.0000 ug | ORAL_TABLET | Freq: Every day | ORAL | Status: DC
  Administered 2022-10-11 – 2022-10-13 (×3): 50 ug via ORAL
  Filled 2022-10-10 (×3): qty 1

## 2022-10-10 MED ORDER — FENTANYL CITRATE (PF) 100 MCG/2ML IJ SOLN: 25.0000 ug | INTRAMUSCULAR | Status: DC | PRN

## 2022-10-10 MED ORDER — CEFAZOLIN SODIUM-DEXTROSE 2-4 GM/100ML-% IV SOLN
2.0000 g | INTRAVENOUS | Status: AC
  Administered 2022-10-10: 2 g via INTRAVENOUS
  Filled 2022-10-10: qty 100

## 2022-10-10 MED ORDER — CHLORHEXIDINE GLUCONATE 0.12 % MT SOLN: 15.0000 mL | Freq: Once | OROMUCOSAL | Status: DC

## 2022-10-10 MED ORDER — MIDAZOLAM HCL 2 MG/2ML IJ SOLN
INTRAMUSCULAR | Status: AC
  Filled 2022-10-10: qty 2

## 2022-10-10 MED ORDER — SODIUM CHLORIDE (PF) 0.9 % IJ SOLN
INTRAMUSCULAR | Status: AC
  Filled 2022-10-10: qty 10

## 2022-10-10 MED ORDER — TRANEXAMIC ACID-NACL 1000-0.7 MG/100ML-% IV SOLN
1000.0000 mg | INTRAVENOUS | Status: AC
  Administered 2022-10-10: 1000 mg via INTRAVENOUS
  Filled 2022-10-10: qty 100

## 2022-10-10 MED ORDER — FENTANYL CITRATE (PF) 250 MCG/5ML IJ SOLN
INTRAMUSCULAR | Status: AC
  Filled 2022-10-10: qty 5

## 2022-10-10 MED ORDER — PHENYLEPHRINE 80 MCG/ML (10ML) SYRINGE FOR IV PUSH (FOR BLOOD PRESSURE SUPPORT)
PREFILLED_SYRINGE | INTRAVENOUS | Status: DC | PRN
  Administered 2022-10-10 (×2): 80 ug via INTRAVENOUS

## 2022-10-10 MED ORDER — LIDOCAINE 2% (20 MG/ML) 5 ML SYRINGE
INTRAMUSCULAR | Status: AC
  Filled 2022-10-10: qty 20

## 2022-10-10 MED ORDER — SUCCINYLCHOLINE CHLORIDE 200 MG/10ML IV SOSY
PREFILLED_SYRINGE | INTRAVENOUS | Status: AC
  Filled 2022-10-10: qty 10

## 2022-10-10 MED ORDER — GABAPENTIN 100 MG PO CAPS
100.0000 mg | ORAL_CAPSULE | Freq: Every day | ORAL | Status: DC
  Administered 2022-10-10 – 2022-10-12 (×3): 100 mg via ORAL
  Filled 2022-10-10 (×3): qty 1

## 2022-10-10 MED ORDER — PROPOFOL 10 MG/ML IV BOLUS
INTRAVENOUS | Status: DC | PRN
  Administered 2022-10-10: 150 mg via INTRAVENOUS
  Administered 2022-10-10: 20 mg via INTRAVENOUS

## 2022-10-10 MED ORDER — SODIUM CHLORIDE 0.9 % IV SOLN: INTRAVENOUS | Status: DC

## 2022-10-10 MED ORDER — KETOROLAC TROMETHAMINE 30 MG/ML IJ SOLN
INTRAMUSCULAR | Status: AC
  Filled 2022-10-10: qty 1

## 2022-10-10 MED ORDER — ROPIVACAINE HCL 5 MG/ML IJ SOLN
INTRAMUSCULAR | Status: DC | PRN
  Administered 2022-10-10: 30 mL via PERINEURAL

## 2022-10-10 MED ORDER — ATORVASTATIN CALCIUM 40 MG PO TABS
40.0000 mg | ORAL_TABLET | Freq: Every day | ORAL | Status: DC
  Administered 2022-10-11 – 2022-10-13 (×3): 40 mg via ORAL
  Filled 2022-10-10 (×3): qty 1

## 2022-10-10 MED ORDER — LIDOCAINE 2% (20 MG/ML) 5 ML SYRINGE
INTRAMUSCULAR | Status: DC | PRN
  Administered 2022-10-10: 100 mg via INTRAVENOUS

## 2022-10-10 MED ORDER — HYDROCODONE-ACETAMINOPHEN 7.5-325 MG PO TABS
1.0000 | ORAL_TABLET | ORAL | Status: DC | PRN
  Administered 2022-10-12: 2 via ORAL
  Filled 2022-10-10: qty 2

## 2022-10-10 MED ORDER — DEXAMETHASONE SODIUM PHOSPHATE 10 MG/ML IJ SOLN
INTRAMUSCULAR | Status: DC | PRN
  Administered 2022-10-10: 10 mg via INTRAVENOUS

## 2022-10-10 MED ORDER — PROMETHAZINE HCL 25 MG/ML IJ SOLN: 6.2500 mg | INTRAMUSCULAR | Status: DC | PRN

## 2022-10-10 MED ORDER — ACETAMINOPHEN 500 MG PO TABS
500.0000 mg | ORAL_TABLET | Freq: Four times a day (QID) | ORAL | Status: AC
  Administered 2022-10-10 – 2022-10-11 (×3): 500 mg via ORAL
  Filled 2022-10-10 (×3): qty 1

## 2022-10-10 MED ORDER — DEXAMETHASONE SODIUM PHOSPHATE 10 MG/ML IJ SOLN
INTRAMUSCULAR | Status: AC
  Filled 2022-10-10: qty 3

## 2022-10-10 MED ORDER — ORAL CARE MOUTH RINSE: 15.0000 mL | Freq: Once | OROMUCOSAL | Status: AC

## 2022-10-10 MED ORDER — PROPOFOL 10 MG/ML IV BOLUS
INTRAVENOUS | Status: AC
  Filled 2022-10-10: qty 20

## 2022-10-10 MED ORDER — EPHEDRINE 5 MG/ML INJ
INTRAVENOUS | Status: AC
  Filled 2022-10-10: qty 10

## 2022-10-10 MED ORDER — KETOROLAC TROMETHAMINE 30 MG/ML IJ SOLN
INTRAMUSCULAR | Status: DC | PRN
  Administered 2022-10-10: 30 mg via INTRAVENOUS

## 2022-10-10 MED ORDER — ROCURONIUM BROMIDE 10 MG/ML (PF) SYRINGE
PREFILLED_SYRINGE | INTRAVENOUS | Status: AC
  Filled 2022-10-10: qty 30

## 2022-10-10 MED ORDER — MORPHINE SULFATE (PF) 2 MG/ML IV SOLN
0.5000 mg | INTRAVENOUS | Status: DC | PRN
  Administered 2022-10-11 (×2): 1 mg via INTRAVENOUS
  Filled 2022-10-10 (×2): qty 1

## 2022-10-10 MED ORDER — PHENYLEPHRINE 80 MCG/ML (10ML) SYRINGE FOR IV PUSH (FOR BLOOD PRESSURE SUPPORT)
PREFILLED_SYRINGE | INTRAVENOUS | Status: AC
  Filled 2022-10-10: qty 20

## 2022-10-10 MED ORDER — ONDANSETRON HCL 4 MG/2ML IJ SOLN
INTRAMUSCULAR | Status: AC
  Filled 2022-10-10: qty 8

## 2022-10-10 MED ORDER — FENTANYL CITRATE (PF) 100 MCG/2ML IJ SOLN
INTRAMUSCULAR | Status: AC
  Filled 2022-10-10: qty 2

## 2022-10-10 MED ORDER — HYDROCODONE-ACETAMINOPHEN 5-325 MG PO TABS
1.0000 | ORAL_TABLET | ORAL | Status: DC | PRN
  Administered 2022-10-10: 2 via ORAL
  Administered 2022-10-11: 1 via ORAL
  Administered 2022-10-12: 2 via ORAL
  Filled 2022-10-10: qty 1
  Filled 2022-10-10 (×3): qty 2

## 2022-10-10 MED ORDER — CHLORHEXIDINE GLUCONATE 0.12 % MT SOLN
15.0000 mL | Freq: Once | OROMUCOSAL | Status: AC
  Administered 2022-10-10: 15 mL via OROMUCOSAL
  Filled 2022-10-10: qty 15

## 2022-10-10 MED ORDER — AMISULPRIDE (ANTIEMETIC) 5 MG/2ML IV SOLN: 10.0000 mg | Freq: Once | INTRAVENOUS | Status: DC | PRN

## 2022-10-10 SURGICAL SUPPLY — 49 items
BAG COUNTER SPONGE SURGICOUNT (BAG) ×2 IMPLANT
BAG SPNG CNTER NS LX DISP (BAG) ×1
BLADE SURG 10 STRL SS (BLADE) ×2 IMPLANT
BNDG CMPR MED 15X6 ELC VLCR LF (GAUZE/BANDAGES/DRESSINGS) ×1
BNDG COHESIVE 4X5 TAN STRL (GAUZE/BANDAGES/DRESSINGS) ×2 IMPLANT
BNDG ELASTIC 6X15 VLCR STRL LF (GAUZE/BANDAGES/DRESSINGS) IMPLANT
BNDG GAUZE DERMACEA FLUFF 4 (GAUZE/BANDAGES/DRESSINGS) ×2 IMPLANT
BNDG GZE DERMACEA 4 6PLY (GAUZE/BANDAGES/DRESSINGS) ×1
CLSR STERI-STRIP ANTIMIC 1/2X4 (GAUZE/BANDAGES/DRESSINGS) IMPLANT
COVER MAYO STAND STRL (DRAPES) ×2 IMPLANT
COVER SURGICAL LIGHT HANDLE (MISCELLANEOUS) ×2 IMPLANT
DRAPE INCISE IOBAN 66X45 STRL (DRAPES) ×2 IMPLANT
DRAPE U-SHAPE 47X51 STRL (DRAPES) ×2 IMPLANT
DRSG ADAPTIC 3X8 NADH LF (GAUZE/BANDAGES/DRESSINGS) ×2 IMPLANT
DRSG AQUACEL AG ADV 3.5X 6 (GAUZE/BANDAGES/DRESSINGS) IMPLANT
DURAPREP 26ML APPLICATOR (WOUND CARE) ×2 IMPLANT
ELECT REM PT RETURN 9FT ADLT (ELECTROSURGICAL) ×1
ELECTRODE REM PT RTRN 9FT ADLT (ELECTROSURGICAL) ×2 IMPLANT
GAUZE PAD ABD 8X10 STRL (GAUZE/BANDAGES/DRESSINGS) ×4 IMPLANT
GAUZE SPONGE 4X4 12PLY STRL (GAUZE/BANDAGES/DRESSINGS) ×2 IMPLANT
GLOVE BIOGEL PI IND STRL 8.5 (GLOVE) ×2 IMPLANT
GLOVE SURG ORTHO 9.0 STRL STRW (GLOVE) ×2 IMPLANT
GOWN STRL REUS W/ TWL XL LVL3 (GOWN DISPOSABLE) ×2 IMPLANT
GOWN STRL REUS W/TWL XL LVL3 (GOWN DISPOSABLE) ×1
KIT BASIN OR (CUSTOM PROCEDURE TRAY) ×2 IMPLANT
KIT TURNOVER KIT B (KITS) ×2 IMPLANT
MANIFOLD NEPTUNE II (INSTRUMENTS) ×2 IMPLANT
NS IRRIG 1000ML POUR BTL (IV SOLUTION) ×2 IMPLANT
PACK ORTHO EXTREMITY (CUSTOM PROCEDURE TRAY) ×2 IMPLANT
PAD ARMBOARD 7.5X6 YLW CONV (MISCELLANEOUS) ×4 IMPLANT
RETRIEVER SUT HEWSON (MISCELLANEOUS) ×2 IMPLANT
SPONGE T-LAP 18X18 ~~LOC~~+RFID (SPONGE) ×4 IMPLANT
STAPLER VISISTAT 35W (STAPLE) ×2 IMPLANT
STOCKINETTE IMPERVIOUS 9X36 MD (GAUZE/BANDAGES/DRESSINGS) ×2 IMPLANT
STRIP CLOSURE SKIN 1/2X4 (GAUZE/BANDAGES/DRESSINGS) IMPLANT
SUT FIBERWIRE #2 38 T-5 BLUE (SUTURE) ×2
SUT MNCRL AB 3-0 PS2 27 (SUTURE) IMPLANT
SUT VIC AB 0 CT1 27 (SUTURE) ×2
SUT VIC AB 0 CT1 27XBRD ANBCTR (SUTURE) ×2 IMPLANT
SUT VIC AB 2-0 CT1 27 (SUTURE) ×1
SUT VIC AB 2-0 CT1 TAPERPNT 27 (SUTURE) IMPLANT
SUTURE FIBERWR #2 38 T-5 BLUE (SUTURE) ×4 IMPLANT
SYS INTERNAL BRACE KNEE (Miscellaneous) ×1 IMPLANT
SYSTEM INTERNAL BRACE KNEE (Miscellaneous) IMPLANT
TOWEL GREEN STERILE (TOWEL DISPOSABLE) ×2 IMPLANT
TOWEL GREEN STERILE FF (TOWEL DISPOSABLE) ×2 IMPLANT
TUBE CONNECTING 12X1/4 (SUCTIONS) ×2 IMPLANT
TUBE ENDOTRAC EMG 7X10.2 (MISCELLANEOUS) ×2 IMPLANT
YANKAUER SUCT BULB TIP NO VENT (SUCTIONS) ×2 IMPLANT

## 2022-10-10 NOTE — Plan of Care (Signed)
  Problem: Activity: Goal: Risk for activity intolerance will decrease Outcome: Progressing   Problem: Nutrition: Goal: Adequate nutrition will be maintained Outcome: Progressing   

## 2022-10-10 NOTE — Anesthesia Procedure Notes (Signed)
Procedure Name: LMA Insertion Date/Time: 10/10/2022 1:46 PM  Performed by: Lonia Mad, CRNAPre-anesthesia Checklist: Patient identified, Emergency Drugs available, Suction available and Patient being monitored Patient Re-evaluated:Patient Re-evaluated prior to induction Oxygen Delivery Method: Circle System Utilized Preoxygenation: Pre-oxygenation with 100% oxygen Induction Type: IV induction Ventilation: Mask ventilation without difficulty LMA: LMA inserted LMA Size: 4.0 Number of attempts: 1 Placement Confirmation: positive ETCO2 Tube secured with: Tape Dental Injury: Teeth and Oropharynx as per pre-operative assessment

## 2022-10-10 NOTE — Anesthesia Procedure Notes (Signed)
Anesthesia Regional Block: Femoral nerve block   Pre-Anesthetic Checklist: , timeout performed,  Correct Patient, Correct Site, Correct Laterality,  Correct Procedure, Correct Position, site marked,  Risks and benefits discussed,  Surgical consent,  Pre-op evaluation,  At surgeon's request and post-op pain management  Laterality: Left  Prep: chloraprep       Needles:  Injection technique: Single-shot  Needle Type: Echogenic Needle     Needle Length: 9cm      Additional Needles:   Procedures:,,,, ultrasound used (permanent image in chart),,    Narrative:  Start time: 10/10/2022 1:24 PM End time: 10/10/2022 1:32 PM Injection made incrementally with aspirations every 5 mL.  Performed by: Personally  Anesthesiologist: Eilene Ghazi, MD  Additional Notes: Patient tolerated the procedure well without complications

## 2022-10-10 NOTE — Anesthesia Preprocedure Evaluation (Addendum)
Anesthesia Evaluation  Patient identified by MRN, date of birth, ID band Patient awake    Reviewed: Allergy & Precautions, H&P , NPO status , Patient's Chart, lab work & pertinent test results  Airway Mallampati: II  TM Distance: >3 FB Neck ROM: Full    Dental no notable dental hx. (+) Missing   Pulmonary neg pulmonary ROS, former smoker   Pulmonary exam normal breath sounds clear to auscultation       Cardiovascular hypertension, Pt. on medications and Pt. on home beta blockers + CAD, + Past MI and + Cardiac Stents  Normal cardiovascular exam Rhythm:Regular Rate:Normal     Neuro/Psych negative neurological ROS  negative psych ROS   GI/Hepatic negative GI ROS, Neg liver ROS,,,  Endo/Other  Hypothyroidism    Renal/GU Renal InsufficiencyRenal disease  negative genitourinary   Musculoskeletal negative musculoskeletal ROS (+) Arthritis ,    Abdominal   Peds negative pediatric ROS (+)  Hematology negative hematology ROS (+)   Anesthesia Other Findings   Reproductive/Obstetrics negative OB ROS                             Anesthesia Physical Anesthesia Plan  ASA: 3  Anesthesia Plan: General   Post-op Pain Management: Tylenol PO (pre-op)* and Toradol IV (intra-op)*   Induction: Intravenous  PONV Risk Score and Plan: 3 and Ondansetron, Dexamethasone and Treatment may vary due to age or medical condition  Airway Management Planned: LMA  Additional Equipment:   Intra-op Plan:   Post-operative Plan: Extubation in OR  Informed Consent: I have reviewed the patients History and Physical, chart, labs and discussed the procedure including the risks, benefits and alternatives for the proposed anesthesia with the patient or authorized representative who has indicated his/her understanding and acceptance.     Dental advisory given  Plan Discussed with: Surgeon and CRNA  Anesthesia Plan  Comments:         Anesthesia Quick Evaluation

## 2022-10-10 NOTE — H&P (Signed)
ORTHOPAEDIC H and P  REQUESTING PHYSICIAN: Yolonda Kida, MD  PCP:  [name], MD  Chief Complaint: Left quadriceps tendon rupture  HPI: Charles Brooks is a 72 y.o. male who complains of left knee pain and unable to bear weight following an injury at home.  He presented to our urgent care clinic with signs and symptoms consistent with quadriceps tendon rupture.  He is here today for open repair.  No new complaints today.  He has been compliant with knee immobilizer.  Past Medical History:  Diagnosis Date   Aneurysm    in head per pt that sealed itself in 2007   Arthritis    CAD (coronary artery disease), native coronary artery    DES RCA and PTCA RVM 07/2014, LVEF 55%   Cellulitis and abscess 08/08/2015   CKD (chronic kidney disease) stage 3, GFR 30-59 ml/min    Essential hypertension    History of kidney stones    Hyperkalemia 07/06/2019   Hyperlipidemia    Hypothyroidism    STEMI (ST elevation myocardial infarction)    07/2014   Past Surgical History:  Procedure Laterality Date   CHOLECYSTECTOMY     COLONOSCOPY N/A 02/09/2021   Procedure: COLONOSCOPY;  Surgeon: Franky Macho, MD;  [facility];  Service: Gastroenterology;  Laterality: N/A;   COLONOSCOPY WITH PROPOFOL N/A 11/08/2013   Procedure: COLONOSCOPY WITH PROPOFOL;  Surgeon: Dalia Heading, MD;  Location: [facility];  Service: General;  Laterality: N/A;  in cecum at 0741; cecal withdrawal time = 10 min   coronary stents      CYSTOSCOPY W/ RETROGRADES Bilateral 10/14/2021   Procedure: CYSTOSCOPY WITH RETROGRADE PYELOGRAM;  Surgeon: Milderd Meager., MD;  Location: [facility];  Service: Urology;  Laterality: Bilateral;   CYSTOSCOPY WITH STENT PLACEMENT Bilateral 10/14/2021   Procedure: CYSTOSCOPY WITH STENT PLACEMENT;  Surgeon: Milderd Meager., MD;  Location: [facility];  Service: Urology;  Laterality: Bilateral;   EXTRACORPOREAL SHOCK WAVE LITHOTRIPSY     EXTRACORPOREAL SHOCK WAVE LITHOTRIPSY  Right 09/22/2020   Procedure: EXTRACORPOREAL SHOCK WAVE LITHOTRIPSY (ESWL);  Surgeon: Malen Gauze, MD;  Location: [facility];  Service: Urology;  Laterality: Right;  cases not starting until 10:00 due to last minute add ons, need time to get covid tests back   EXTRACORPOREAL SHOCK WAVE LITHOTRIPSY Left 10/26/2021   Procedure: EXTRACORPOREAL SHOCK WAVE LITHOTRIPSY (ESWL);  Surgeon: Milderd Meager., MD;  Location: [facility];  Service: Urology;  Laterality: Left;   INCISION AND DRAINAGE ABSCESS Right 08/12/2015   Procedure: INCISION AND DRAINAGE ABSCESS;  Surgeon: Franky Macho, MD;  Location: [facility];  Service: General;  Laterality: Right;   KNEE ARTHROSCOPY Right    LEFT HEART CATHETERIZATION WITH CORONARY ANGIOGRAM N/A 08/14/2014   Procedure: LEFT HEART CATHETERIZATION WITH CORONARY ANGIOGRAM;  Surgeon: Lennette Bihari, MD;  LAD 50-60%, CFX OK, OM1 80%(small), RCA 100>>0% w/ 2.25x24 mm Synergy DES, EF 55%   POLYPECTOMY N/A 11/08/2013   Procedure: POLYPECTOMY;  Surgeon: Dalia Heading, MD;  Location: [facility];  Service: General;  Laterality: N/A;  cecal polyp   POLYPECTOMY  02/09/2021   Procedure: POLYPECTOMY INTESTINAL;  Surgeon: Franky Macho, MD;  [facility];  Service: Gastroenterology;;   Removal of kidney stones     Open   URETEROSCOPY WITH HOLMIUM LASER LITHOTRIPSY Right 10/14/2021   Procedure: URETEROSCOPY WITH HOLMIUM LASER LITHOTRIPSY;  Surgeon: Milderd Meager., MD;  Location: [facility];  Service: Urology;  Laterality: Right;  WOUND DEBRIDEMENT Right 10/26/2015   Procedure: DEBRIDEMENT WOUND RIGHT FLANK;  Surgeon: Franky Macho, MD;  Location: [facility];  Service: General;  Laterality: Right;   Social History   Socioeconomic History   Marital status: Married    Spouse name: Not on file   Number of children: Not on file   Years of education: Not on file   Highest education level: Not on file  Occupational History   Not on file  Tobacco Use   Smoking status: Former     Types: Cigars    Quit date: 11/05/2003    Years since quitting: 18.9   Smokeless tobacco: Never  Vaping Use   Vaping Use: Never used  Substance and Sexual Activity   Alcohol use: No    Alcohol/week: 0.0 standard drinks of alcohol   Drug use: No   Sexual activity: Yes    Birth control/protection: None  Other Topics Concern   Not on file  Social History Narrative   Not on file   Social Determinants of Health   Financial Resource Strain: Not on file  Food Insecurity: Not on file  Transportation Needs: Not on file  Physical Activity: Not on file  Stress: Not on file  Social Connections: Not on file   Family History  Problem Relation Age of Onset   Heart attack Brother    Allergies  Allergen Reactions   Codeine Hives   Prior to Admission medications   Medication Sig Start Date End Date Taking? Authorizing Provider  aspirin 81 MG EC tablet Take 1 tablet (81 mg total) by mouth daily with breakfast. 07/07/19  Yes Emokpae, Courage, MD  atorvastatin (LIPITOR) 40 MG tablet Take 1 tablet (40 mg total) by mouth daily. 07/29/16  Yes Jonelle Sidle, MD  gabapentin (NEURONTIN) 100 MG capsule Take 1 capsule (100 mg total) by mouth at bedtime. 10/15/21  Yes Johnson, Clanford L, MD  levothyroxine (SYNTHROID) 50 MCG tablet Take 50 mcg by mouth daily before breakfast. 09/17/22  Yes [provider]  meloxicam (MOBIC) 7.5 MG tablet Take 7.5 mg by mouth daily. 09/17/22  Yes [provider]  metoprolol tartrate (LOPRESSOR) 25 MG tablet Take 25 mg by mouth 2 (two) times daily.   Yes [provider]  vitamin B-12 (CYANOCOBALAMIN) 1000 MCG tablet Take 1,000 mcg by mouth daily.   Yes [provider]  zinc gluconate 50 MG tablet Take 50 mg by mouth daily.   Yes [provider]  nitroGLYCERIN (NITROSTAT) 0.4 MG SL tablet Place 1 tablet (0.4 mg total) under the tongue every 5 (five) minutes x 3 doses as needed for chest pain. 02/26/21   Jonelle Sidle, MD    No results found.  Positive ROS: All other systems have been reviewed and were otherwise negative with the exception of those mentioned in the HPI and as above.  Physical Exam: General: Alert, no acute distress Cardiovascular: No pedal edema Respiratory: No cyanosis, no use of accessory musculature GI: No organomegaly, abdomen is soft and non-tender Skin: No lesions in the area of chief complaint Neurologic: Sensation intact distally Psychiatric: Patient is competent for consent with normal mood and affect Lymphatic: No axillary or cervical lymphadenopathy  MUSCULOSKELETAL: Left lower extremity is warm and well-perfused.  No open wounds or lesions.  Obvious palpable defect at the superior pole of the patella.  Assessment: Left quadriceps tendon rupture  Plan: Plan to proceed today with open repair of the left quadriceps tendon.  We again discussed the risk  of bleeding, infection, damage to surrounding nerves and vessels, fracture, dislocation, recurrent tear, need for further surgery, stiffness, as well as the risk of DVT.  He has provided informed consent.  Plan for admission postoperatively due to debilitated state.  Will need to be evaluated by her therapist for dispositional planning purposes.    Yolonda Kida, MD Cell [phone]    10/10/2022 1:13 PM

## 2022-10-10 NOTE — Transfer of Care (Addendum)
Immediate Anesthesia Transfer of Care Note  Patient: Charles Brooks  Procedure(s) Performed: OPEN REPAIR QUADRICEP TENDON (Left)  Patient Location: PACU  Anesthesia Type:General and Regional  Level of Consciousness: awake and oriented  Airway & Oxygen Therapy: Patient Spontanous Breathing  Post-op Assessment: Report given to RN  Post vital signs: Reviewed and stable  Last Vitals:  Vitals Value Taken Time  BP 116/63   Temp    Pulse 64   Resp 19   SpO2 99     Last Pain:  Vitals:   10/10/22 1218  TempSrc: Oral  PainSc: 0-No pain         Complications: No notable events documented.

## 2022-10-10 NOTE — Progress Notes (Signed)
Orthopedic Tech Progress Note Patient Details:  Charles Brooks Jul 26, 1950 [id number]  MD called requesting a BLEDSOE BRACE for patient. Dropped it off to patient with wife at bedside   Patient ID: Charles Brooks, male   DOB: 21-Mar-1951, 72 y.o.   MRN: [id number]  [name] 10/10/2022, 1:32 PM

## 2022-10-10 NOTE — Anesthesia Procedure Notes (Signed)
Anesthesia Procedure Image    

## 2022-10-10 NOTE — Brief Op Note (Signed)
10/10/2022  2:31 PM  PATIENT:  Charles Brooks  72 y.o. male  PRE-OPERATIVE DIAGNOSIS:  Left knee quadriceps tear  POST-OPERATIVE DIAGNOSIS:  Left knee quadriceps tear  PROCEDURE:  Procedure(s) with comments: OPEN REPAIR QUADRICEP TENDON (Left) - 90  SURGEON:  Surgeon(s) and Role:    Aundria Rud, Noah Delaine, MD - Primary  ASSISTANTS: Dion Saucier, PA-C   ANESTHESIA:   regional and general  EBL:  20 cc  BLOOD ADMINISTERED:none  DRAINS: none   LOCAL MEDICATIONS USED:  NONE  SPECIMEN:  No Specimen  DISPOSITION OF SPECIMEN:  PATHOLOGY  COUNTS:  YES  TOURNIQUET:  * Missing tourniquet times found for documented tourniquets in log: [id number] *  DICTATION: .Note written in EPIC  PLAN OF CARE: Admit to inpatient   PATIENT DISPOSITION:  PACU - hemodynamically stable.   Delay start of Pharmacological VTE agent (>24hrs) due to surgical blood loss or risk of bleeding: not applicable

## 2022-10-10 NOTE — Op Note (Signed)
Date of Surgery: 10/10/2022  INDICATIONS: [name] is a 72 y.o.-year-old male with a left quadriceps tendon rupture;  The patient did consent to the procedure after discussion of the risks and benefits.  PREOPERATIVE DIAGNOSIS: Left acute quadriceps tendon rupture  POSTOPERATIVE DIAGNOSIS: Same.  PROCEDURE: Open repair left quadriceps tendon  SURGEON: Maryan Rued, M.D.  ASSIST: Dion Saucier, PA-C  Assistant attestation:  PA McClung present for the entire procedure..  ANESTHESIA:  general, femoral nerve block with Exparel  IV FLUIDS AND URINE: See anesthesia.  ESTIMATED BLOOD LOSS: 20 mL.  IMPLANTS: Arthrex 4.75 mm swivel lock anchor biocomposite x 2 Arthrex fiber tape x 1 Arthrex #2 FiberWire x 1  DRAINS: None  COMPLICATIONS: None.  PREOPERATIVE INDICATIONS: [name] is a very pleasant 72 year old male with a diagnosis of left quadriceps rupture who elected for surgical management in order to restore the function of the extensor mechanism.     The risks benefits and alternatives were discussed with the patient preoperatively including but not limited to the risks of infection, bleeding, nerve injury, cardiopulmonary complications, the need for revision surgery, hardware prominence, hardware failure, the need for hardware removal, nonunion, malunion, posttraumatic arthritis, stiffness, loss of strength and function, among others, and the patient was willing to proceed.     OPERATIVE FINDINGS: Acute on chronic appearing complete quadriceps tendon rupture from superior pole of the patella.  Extensive adhesions between the extensor mechanism and the subcutaneous fat.  There was a short less than 1 cm stump remaining on the superior pole of the patella.  Significant hematoma encountered as well as retraction of the quadriceps tendon stump approximately 5 cm.   OPERATIVE PROCEDURE: The patient was brought to the operating room and placed in the supine position.  General anesthesia was administered. IV antibiotics were given. The lower extremity was prepped and draped in usual sterile fashion.  Given patient's large body habitus a tourniquet was not able to be used.  Time out was performed.    Anterior incision was made over the patella and quadriceps tendon.  Dissection was carried down to the layer of the prepatellar retinaculum.  Proximal dissection was carried forth to the level of the tear.  The tendon was noted to be completely ruptured and retracted about 5 cm proximally.  There were extensive adhesions noted between the sensor mechanism and the subcutaneous fat.  The retinaculum was torn on either side.   Once the adhesions were released I then utilized to Arthrex fiber tape sutures to run a Krakw running locking suture figuration on the medial half up and down to the tendon stump.  I then repeated this on the lateral half running a Krakw style suture up and down.     Next we prepared the superior pole of the patella.  We debrided the stump from the superior pole.  All devitalized tissue was removed.  We next used rondure your to biologically prepare the superior pole bone.   Next we moved to secure the suture limbs into the superior pole of the patella.  We prepared for the placement of 2 parallel 4.75 mm Arthrex swivel lock anchors.  Drill pins were placed.  We then overdrilled with the appropriate cannulated drill bit to a depth of 20 mm.  We next used the hand tap to prepare the drill sockets.   The lateral  suture limb was loaded into the lateral swivel lock and the medial suture was loaded into the medial swivel lock.  These were secured into the bone and had excellent purchase.  Next to the free limbs of the suture tapes as well as the free limbs of the suture anchor #2 FiberWire were then ran back into the tendon in a horizontal mattress configuration and tied down to completely secure the tendon to bone interface.  This was repeated on both the  medial and lateral side.   We then moved to close the retinacular tissue.  This was performed with a #2 FiberWire and a running locking fashion.  Following complete closure of the extensor mechanism and the retinaculum I was able to passively flex the knee to about 30 degrees with no gapping noted at the tendon bone interface.   The wounds were irrigated copiously.  The deep fat layer was closed with a running 0 Vicryl.  Deep dermal tissue was closed with interrupted 2-0 Vicryl.  Skin was reapproximated and closed with staples.  The wounds were also injected.  Standard sterile bandages were applied.  Patient was placed in a Bledsoe style hinged knee brace locked in full extension. The patient was awakened and returned to the PACU in stable and satisfactory condition. There were no complications.      Disposition:   Patient was transferred to PACU in stable condition.  He will be 50% weightbearing to the left lower extremity with the leg locked in full extension.  He will be placed in observation given his complex medical morbidities to assure that he is stable overnight.  We will likely discharge him home tomorrow.  He will follow-up in my office in 2 to 3 weeks for wound check and staple removal.

## 2022-10-11 MED ORDER — CHLORHEXIDINE GLUCONATE CLOTH 2 % EX PADS
6.0000 | MEDICATED_PAD | Freq: Every day | CUTANEOUS | Status: DC
  Administered 2022-10-11 – 2022-10-13 (×3): 6 via TOPICAL

## 2022-10-11 MED ORDER — MUPIROCIN 2 % EX OINT
1.0000 | TOPICAL_OINTMENT | Freq: Two times a day (BID) | CUTANEOUS | Status: DC
  Administered 2022-10-11 – 2022-10-12 (×4): 1 via NASAL
  Filled 2022-10-11: qty 22

## 2022-10-11 MED ORDER — MELATONIN 3 MG PO TABS
3.0000 mg | ORAL_TABLET | Freq: Every day | ORAL | Status: DC
  Administered 2022-10-11 – 2022-10-12 (×2): 3 mg via ORAL
  Filled 2022-10-11 (×2): qty 1

## 2022-10-11 NOTE — TOC Initial Note (Addendum)
Transition of Care [facility]) - Initial/Assessment Note    Patient Details  Name: Charles Brooks MRN: [id number] Date of Birth: 1950-07-03  [facility]) CM/SW Contact:    Lorri Frederick, LCSW Phone Number: 10/11/2022, 2:10 PM  Clinical Narrative:  CSW met with pt regarding DC plan.  Discussed CIR cannot move forward, pt is agreeable to SNF.  Pt lives at home with wife, no current services.  Medicare choice document given, permission given to send out referral in hub.  Pt is asking for SNF facility in [facility], [place] if possible.  Referral sent out to SNF, reached out to [facility] and [facility].           1530: Both [facility] and [facility] offered beds, pt informed of all bed offers, does want to discuss with wife.    CSW LM with [employer] to request SNF authorization, facility pending.          Expected Discharge Plan: Skilled Nursing Facility Barriers to Discharge: Continued Medical Work up, SNF Pending bed offer   Patient Goals and CMS Choice Patient states their goals for this hospitalization and ongoing recovery are:: back like I was before CMS Medicare.gov Compare Post Acute Care list provided to:: Patient Choice offered to / list presented to : Patient      Expected Discharge Plan and Services In-house Referral: Clinical Social Work   Post Acute Care Choice: Skilled Nursing Facility Living arrangements for the past 2 months: Single Family Home                                      Prior Living Arrangements/Services Living arrangements for the past 2 months: Single Family Home Lives with:: Spouse Patient language and need for interpreter reviewed:: Yes Do you feel safe going back to the place where you live?: Yes      Need for Family Participation in Patient Care: No (Comment) Care giver support system in place?: Yes (comment) Current home services: Other (comment) (none) Criminal Activity/Legal Involvement Pertinent to Current  Situation/Hospitalization: No - Comment as needed  Activities of Daily Living      Permission Sought/Granted Permission sought to share information with : Family Supports Permission granted to share information with : Yes, Verbal Permission Granted  Share Information with NAME: wife Bonita Quin, brother Peyton Najjar  Permission granted to share info w AGENCY: SNF        Emotional Assessment Appearance:: Appears stated age Attitude/Demeanor/Rapport: Engaged Affect (typically observed): Appropriate, Pleasant Orientation: : Oriented to Self, Oriented to Place, Oriented to  Time, Oriented to Situation      Admission diagnosis:  Traumatic rupture of left quadriceps tendon [X32.440N] Patient Active Problem List   Diagnosis Date Noted   Traumatic rupture of left quadriceps tendon 10/10/2022   Ureterolithiasis 10/15/2021   Hydronephrosis concurrent with and due to calculi of kidney and ureter 10/14/2021   Leukocytosis 10/14/2021   Left flank pain 10/14/2021   Hyperglycemia 10/14/2021   Special screening for malignant neoplasms, colon    Personal history of colonic polyps    Adenomatous polyp of ascending colon    Diverticulosis    Nephrolithiasis 09/18/2020   Hyperkalemia 07/06/2019   AKI (acute kidney injury) on CKD stage 3a  07/05/2019   Arthritis 08/26/2014   CKD (chronic kidney disease) stage 3, GFR 30-59 ml/min (HCC) 08/22/2014   Hypertriglyceridemia 08/16/2014   Dyslipidemia - low HDL  08/16/2014   CAD (coronary artery disease), native coronary artery 08/14/2014   Former tobacco use 08/14/2014   PCP:  [name], MD Pharmacy:   [facility] [address], [place] - [address] [place] [address] Phone: [phone] Fax: [phone]  [facility] [place], [place] - [address] [place] [address] [place] [address] Phone: [phone] Fax: [phone]     Social Determinants of Health (SDOH) Social History: SDOH  Screenings   Tobacco Use: Medium Risk (10/10/2022)   SDOH Interventions:     Readmission Risk Interventions     No data to display

## 2022-10-11 NOTE — Care Management Obs Status (Signed)
MEDICARE OBSERVATION STATUS NOTIFICATION   Patient Details  Name: Charles Brooks MRN: [id number] Date of Birth: 1951/05/20   Medicare Observation Status Notification Given:  Yes    Lawerance Sabal, RN 10/11/2022, 4:04 PM

## 2022-10-11 NOTE — Evaluation (Signed)
Physical Therapy Evaluation Patient Details Name: Charles Brooks MRN: [id number] DOB: 08/09/1950 Today's Date: 10/11/2022  History of Present Illness  Pt is a 72 y/o M admitted on 10/10/22 for scheduled L quad tendon repair. PMH: anuerysm, CAD, CKD 3, HTN, HLD, hypothyroidism, STEMI (07/2014)  Clinical Impression  Pt seen for PT evaluation with pt agreeable to tx. Pt reports prior to injury he was independent without AD but since injury he has had difficulty mobilizing & even another fall. Pt reports he lives with his wife in a 1 level home with 6 steps with B rails to access. Pt notes he is independent with all ADLs but assists his wife with peri hygiene & notes she is unable to physically care for him at home. Pt tearful at times throughout session, noting he is hoping to d/c to rehab to maximize independence & reduce fall risk prior to return home with wife -- PT provided listening & encouragement. During session, PT assists with donning new brace as current one has a broken clip. Pt requires assistance to don underwear & shorts from bed level but is able to complete bed mobility with supervision with hospital bed features. Pt progresses to transfers with CGA & gait with RW & close supervision with cuing for technique. Pt would benefit from ongoing PT services to address balance, strengthening, endurance, gait & stairs prior to return home with wife.        Recommendations for follow up therapy are one component of a multi-disciplinary discharge planning process, led by the attending physician.  Recommendations may be updated based on patient status, additional functional criteria and insurance authorization.  Follow Up Recommendations       Assistance Recommended at Discharge Intermittent Supervision/Assistance  Patient can return home with the following  A little help with bathing/dressing/bathroom;A little help with walking and/or transfers;Assistance with cooking/housework;Assist for  transportation;Help with stairs or ramp for entrance    Equipment Recommendations None recommended by PT  Recommendations for Other Services  OT consult    Functional Status Assessment Patient has had a recent decline in their functional status and demonstrates the ability to make significant improvements in function in a reasonable and predictable amount of time.     Precautions / Restrictions Precautions Precautions: Fall Required Braces or Orthoses: Other Brace Other Brace: L bledsoe brace locked in full extension at all times Restrictions Weight Bearing Restrictions: Yes LLE Weight Bearing: Partial weight bearing LLE Partial Weight Bearing Percentage or Pounds: 50%      Mobility  Bed Mobility Overal bed mobility: Needs Assistance Bed Mobility: Supine to Sit, Rolling Rolling: Supervision (use of bed rails)   Supine to sit: Supervision, HOB elevated     General bed mobility comments: use of bed rails, HOB elevated PRN to complete bed mobility    Transfers Overall transfer level: Needs assistance Equipment used: Rolling walker (2 wheels) Transfers: Sit to/from Stand, Bed to chair/wheelchair/BSC Sit to Stand: Min guard   Step pivot transfers: Min guard (bed>recliner on R with RW)       General transfer comment: STS from elevated EOB & recliner with cuing for hand placement, foot placement & overall technique.    Ambulation/Gait Ambulation/Gait assistance: Min guard, Supervision Gait Distance (Feet): 60 Feet Assistive device: Rolling walker (2 wheels) Gait Pattern/deviations: Decreased step length - right, Decreased stride length, Decreased step length - left Gait velocity: decreased     General Gait Details: PT provides education & demo re: gait pattern with RW to maintain  PWB LLE. Pt ambulates with slow gait speed, education to roll vs pick up RW, and cuing for hand placement on RW.  Stairs            Wheelchair Mobility    Modified Rankin (Stroke  Patients Only)       Balance Overall balance assessment: Needs assistance Sitting-balance support: Feet supported Sitting balance-Leahy Scale: Good     Standing balance support: During functional activity, Bilateral upper extremity supported, Reliant on assistive device for balance Standing balance-Leahy Scale: Fair                               Pertinent Vitals/Pain Pain Assessment Pain Assessment: No/denies pain    Home Living Family/patient expects to be discharged to:: Private residence Living Arrangements: Spouse/significant other Available Help at Discharge: Family;Available PRN/intermittently (wife cannot provide physical assistance) Type of Home: House Home Access: Stairs to enter Entrance Stairs-Rails: Right;Left;Can reach both Entrance Stairs-Number of Steps: 6   Home Layout: One level Home Equipment: Agricultural consultant (2 wheels);BSC/3in1;Cane - single point      Prior Function Prior Level of Function : Independent/Modified Independent;Driving             Mobility Comments: Pt reports he was independent without AD but since fall resulting in injury pt has fallen again. Pt reports he assists his wife at baseline (she uses [facility] & he sometimes assists her with posterior peri hygiene). Pt notes he was driving prior to admission. ADLs Comments: Pt reports independence with bathing, dressing, cooking, & cleaning.     Hand Dominance        Extremity/Trunk Assessment   Upper Extremity Assessment Upper Extremity Assessment: Overall WFL for tasks assessed    Lower Extremity Assessment Lower Extremity Assessment: LLE deficits/detail LLE Deficits / Details: LLE bledsoe brace donned throughout session, locked in extension. Pt with ted hose on but rolled down & causing significant indention in pt's LLE -- PT adjusted ted hose to prevent further issue. Pt able to perform LLE straight leg raise with ease with brace donned.       Communication    Communication: No difficulties  Cognition Arousal/Alertness: Awake/alert Behavior During Therapy: WFL for tasks assessed/performed Overall Cognitive Status: Within Functional Limits for tasks assessed                                 General Comments: Very pleasant man, eager to participate & appreciative of PT session. Tearful at times when talking about going home before he's ready & does not want to be a burden to wife - PT provides encouragement.        General Comments General comments (skin integrity, edema, etc.): Pt notes current brace is broken & PT observed one clip to be broken. New brace in room & PT provided total assist for changing braces & fitting new one appropriately.    Exercises     Assessment/Plan    PT Assessment Patient needs continued PT services  PT Problem List Decreased strength;Decreased range of motion;Decreased activity tolerance;Decreased balance;Decreased mobility;Decreased safety awareness;Decreased knowledge of precautions;Decreased knowledge of use of DME       PT Treatment Interventions DME instruction;Therapeutic exercise;Gait training;Balance training;Stair training;Neuromuscular re-education;Functional mobility training;Patient/family education;Therapeutic activities;Modalities    PT Goals (Current goals can be found in the Care Plan section)  Acute Rehab PT Goals Patient Stated Goal: go to rehab  before going home, return to independence prior to d/c to reduce fall risk once home PT Goal Formulation: With patient Time For Goal Achievement: 10/25/22 Potential to Achieve Goals: Good    Frequency Min 4X/week     Co-evaluation               AM-PAC PT "6 Clicks" Mobility  Outcome Measure Help needed turning from your back to your side while in a flat bed without using bedrails?: None Help needed moving from lying on your back to sitting on the side of a flat bed without using bedrails?: A Little Help needed moving to and  from a bed to a chair (including a wheelchair)?: A Little Help needed standing up from a chair using your arms (e.g., wheelchair or bedside chair)?: A Little Help needed to walk in hospital room?: A Little Help needed climbing 3-5 steps with a railing? : A Lot 6 Click Score: 18    End of Session Equipment Utilized During Treatment:  (L bledsoe brace) Activity Tolerance: Patient tolerated treatment well Patient left: in chair;with call bell/phone within reach Nurse Communication: Mobility status PT Visit Diagnosis: Unsteadiness on feet (R26.81);Other abnormalities of gait and mobility (R26.89);Difficulty in walking, not elsewhere classified (R26.2);Muscle weakness (generalized) (M62.81)    Time: 1610-9604 PT Time Calculation (min) (ACUTE ONLY): 45 min   Charges:   PT Evaluation $PT Eval Low Complexity: 1 Low PT Treatments $Gait Training: 8-22 mins $Therapeutic Activity: 8-22 mins        Aleda Grana, PT, DPT 10/11/22, 10:21 AM   Sandi Mariscal 10/11/2022, 10:18 AM

## 2022-10-11 NOTE — NC FL2 (Signed)
[place] MEDICAID FL2 LEVEL OF CARE FORM     IDENTIFICATION  Patient Name: Charles Brooks Birthdate: May 27, 1951 Sex: male Admission Date (Current Location): 10/10/2022  [facility] and [place] Number:  Producer, television/film/video and Address:  [facility], [address], [place], [place] [address]      Provider Number: [id number]  Attending Physician Name and Address:  Yolonda Kida, MD  Relative Name and Phone Number:  Alexande, Sheerin [phone]  [phone]    Current Level of Care: Hospital Recommended Level of Care: Skilled Nursing Facility Prior Approval Number:    Date Approved/Denied:   PASRR Number: [id number]  Discharge Plan: SNF    Current Diagnoses: Patient Active Problem List   Diagnosis Date Noted   Traumatic rupture of left quadriceps tendon 10/10/2022   Ureterolithiasis 10/15/2021   Hydronephrosis concurrent with and due to calculi of kidney and ureter 10/14/2021   Leukocytosis 10/14/2021   Left flank pain 10/14/2021   Hyperglycemia 10/14/2021   Special screening for malignant neoplasms, colon    Personal history of colonic polyps    Adenomatous polyp of ascending colon    Diverticulosis    Nephrolithiasis 09/18/2020   Hyperkalemia 07/06/2019   AKI (acute kidney injury) on CKD stage 3a  07/05/2019   Arthritis 08/26/2014   CKD (chronic kidney disease) stage 3, GFR 30-59 ml/min (HCC) 08/22/2014   Hypertriglyceridemia 08/16/2014   Dyslipidemia - low HDL 08/16/2014   CAD (coronary artery disease), native coronary artery 08/14/2014   Former tobacco use 08/14/2014    Orientation RESPIRATION BLADDER Height & Weight     Self, Time, Situation, Place  Normal Continent Weight: 190 lb (86.2 kg) Height:  6' (182.9 cm)  BEHAVIORAL SYMPTOMS/MOOD NEUROLOGICAL BOWEL NUTRITION STATUS      Continent Diet (see discharge summary)  AMBULATORY STATUS COMMUNICATION OF NEEDS Skin   Supervision Verbally Surgical wounds                        Personal Care Assistance Level of Assistance  Bathing, Feeding, Dressing Bathing Assistance: Limited assistance Feeding assistance: Independent Dressing Assistance: Limited assistance     Functional Limitations Info  Sight, Hearing, Speech Sight Info: Adequate Hearing Info: Adequate Speech Info: Adequate    SPECIAL CARE FACTORS FREQUENCY  PT (By licensed PT), OT (By licensed OT)     PT Frequency: 5x week OT Frequency: 5x week            Contractures Contractures Info: Not present    Additional Factors Info  Code Status, Allergies Code Status Info: full Allergies Info: codeine           Current Medications (10/11/2022):  This is the current hospital active medication list Current Facility-Administered Medications  Medication Dose Route Frequency Provider Last Rate Last Admin   0.9 %  sodium chloride infusion   Intravenous Continuous Yolonda Kida, MD 10 mL/hr at 10/10/22 1339 Restarted at 10/10/22 1340   acetaminophen (TYLENOL) tablet 325-650 mg  325-650 mg Oral Q6H PRN Yolonda Kida, MD       acetaminophen (TYLENOL) tablet 500 mg  500 mg Oral Q6H Yolonda Kida, MD   500 mg at 10/11/22 1141   aspirin EC tablet 81 mg  81 mg Oral Q breakfast Yolonda Kida, MD   81 mg at 10/11/22 4132   atorvastatin (LIPITOR) tablet 40 mg  40 mg Oral Daily Yolonda Kida, MD   40 mg at 10/11/22  9147   Chlorhexidine Gluconate Cloth 2 % PADS 6 each  6 each Topical Daily Yolonda Kida, MD       enoxaparin (LOVENOX) injection 40 mg  40 mg Subcutaneous Q24H Yolonda Kida, MD   40 mg at 10/11/22 0851   gabapentin (NEURONTIN) capsule 100 mg  100 mg Oral QHS Yolonda Kida, MD   100 mg at 10/10/22 2159   HYDROcodone-acetaminophen (NORCO) 7.5-325 MG per tablet 1-2 tablet  1-2 tablet Oral Q4H PRN Yolonda Kida, MD       HYDROcodone-acetaminophen (NORCO/VICODIN) 5-325 MG per tablet 1-2 tablet  1-2 tablet Oral Q4H PRN  Yolonda Kida, MD   2 tablet at 10/10/22 2206   levothyroxine (SYNTHROID) tablet 50 mcg  50 mcg Oral QAC breakfast Yolonda Kida, MD   50 mcg at 10/11/22 8295   metoprolol tartrate (LOPRESSOR) tablet 25 mg  25 mg Oral BID Yolonda Kida, MD   25 mg at 10/11/22 6213   morphine (PF) 2 MG/ML injection 0.5-1 mg  0.5-1 mg Intravenous Q2H PRN Yolonda Kida, MD   1 mg at 10/11/22 0252   mupirocin ointment (BACTROBAN) 2 % 1 Application  1 Application Nasal BID Yolonda Kida, MD       nitroGLYCERIN (NITROSTAT) SL tablet 0.4 mg  0.4 mg Sublingual Q5 Min x 3 PRN Yolonda Kida, MD       ondansetron [facility]) tablet 4 mg  4 mg Oral Q6H PRN Yolonda Kida, MD       Or   ondansetron [facility]) injection 4 mg  4 mg Intravenous Q6H PRN Yolonda Kida, MD         Discharge Medications: Please see discharge summary for a list of discharge medications.  Relevant Imaging Results:  Relevant Lab Results:   Additional Information SSN: [id number]  Lorri Frederick, LCSW

## 2022-10-11 NOTE — Progress Notes (Signed)
  Inpatient Rehabilitation Admissions Coordinator   Per therapy recommendations patient was screened for CIR candidacy by Ottie Glazier RN MSN.  Does not have the medical neccesity for a hospital based rehab program. I will not place a Rehab Conuslt at this time. Recommend other rehab venues to be pursued.   Ottie Glazier, RN, MSN Rehab Admissions Coordinator [phone] 10/11/2022 11:03 AM

## 2022-10-11 NOTE — Progress Notes (Signed)
   Subjective:  Charles Brooks is a 72 y.o. male, 1 Day Post-Op    s/p Procedure(s): OPEN REPAIR QUADRICEP TENDON   Patient reports pain as mild to moderate.  Reports did well with PT. Denies N/T, pain well controlled, new brace applied.   Objective:   VITALS:   Vitals:   10/10/22 2159 10/10/22 2354 10/11/22 0427 10/11/22 0815  BP: (!) 122/50 (!) 103/59 (!) 101/54 108/64  Pulse: 83 76 67   Resp:  Temp:  98 F (36.7 C) 98 F (36.7 C) 98.2 F (36.8 C)  TempSrc:    Oral  SpO2:  98% 98% 96%  Weight:      Height:        NAD in hospital bed  LLE:  Neurologically intact Neurovascular intact Sensation intact distally Intact pulses distally Dorsiflexion/Plantar flexion intact Incision: dressing C/D/I Compartment soft TROM brace on properly, calf soft, nontender.  Able to elevate leg with brace on  Lab Results  Component Value Date   WBC 9.2 10/10/2022   HGB 11.6 (L) 10/10/2022   HCT 35.7 (L) 10/10/2022   MCV 99.7 10/10/2022   PLT 223 10/10/2022   BMET    Component Value Date/Time   NA 135 10/07/2022 1116   NA 138 11/18/2021 0953   K 4.7 10/07/2022 1116   CL 106 10/07/2022 1116   CO2 21 (L) 10/07/2022 1116   GLUCOSE 90 10/07/2022 1116   BUN 36 (H) 10/07/2022 1116   BUN 21 11/18/2021 0953   CREATININE 1.86 (H) 10/10/2022 1831   CALCIUM 9.1 10/07/2022 1116   EGFR 38 (L) 11/18/2021 0953   GFRNONAA 38 (L) 10/10/2022 1831     Assessment/Plan: 1 Day Post-Op   Principal Problem:   Traumatic rupture of left quadriceps tendon   Advance diet Up with therapy  Dispo: Likely SNF due to limited support at home, WB restrictions, not a candidate for CIR. Will have to see how he progresses.   Weightbearing Status: 50%  PWB with walker, locked in extension.  DVT Prophylaxis: Lovenox aspirin   Arbie Cookey 10/11/2022, 11:20 AM  Dion Saucier PA-C  Physician Assistant with Dr. Rebekah [place]

## 2022-10-11 NOTE — Plan of Care (Signed)

## 2022-10-11 NOTE — Evaluation (Signed)
Occupational Therapy Evaluation Patient Details Name: Charles Brooks MRN: [id number] DOB: 1951/03/31 Today's Date: 10/11/2022   History of Present Illness Pt is a 72 y/o M admitted on 10/10/22 for scheduled L quad tendon repair. PMH: anuerysm, CAD, CKD 3, HTN, HLD, hypothyroidism, STEMI (07/2014)   Clinical Impression   Patient is s/p L quad tendon repair surgery resulting in functional limitations due to the deficits listed below (see OT problem list). Pt at baseline helps care for wife and since multiple falls has required (A) for adls. Pt reports wife tries to help him but needs help caring for herself. Pt reports a fall last week in the shower. Pt states "I do not feel safe at home right now. I need professionals to help me so I can be safe." Pt requesting placement near home address to decreased drive distance for wife.  OT communicated patients request to SW for further workup for potential d/c needs. Patient will benefit from skilled OT acutely to increase independence and safety with ADLS to allow discharge skilled inpatient follow up therapy, <3 hours/day. .       Recommendations for follow up therapy are one component of a multi-disciplinary discharge planning process, led by the attending physician.  Recommendations may be updated based on patient status, additional functional criteria and insurance authorization.   Assistance Recommended at Discharge Set up Supervision/Assistance  Patient can return home with the following A little help with walking and/or transfers;A little help with bathing/dressing/bathroom;Assist for transportation    Functional Status Assessment  Patient has had a recent decline in their functional status and demonstrates the ability to make significant improvements in function in a reasonable and predictable amount of time.  Equipment Recommendations  BSC/3in1;Other (comment) (RW)    Recommendations for Other Services       Precautions / Restrictions  Precautions Precautions: Fall Required Braces or Orthoses: Other Brace Other Brace: L bledsoe brace locked in full extension at all times Restrictions Weight Bearing Restrictions: Yes LLE Weight Bearing: Partial weight bearing LLE Partial Weight Bearing Percentage or Pounds: 50%      Mobility Bed Mobility               General bed mobility comments: OOB in chair on arrival    Transfers Overall transfer level: Needs assistance Equipment used: Rolling walker (2 wheels) Transfers: Sit to/from Stand Sit to Stand: Min guard           General transfer comment: cues for hand placement and extending LLE. Pt able to power up with bil UE used on arm rest.      Balance Overall balance assessment: Needs assistance Sitting-balance support: No upper extremity supported, Feet supported Sitting balance-Leahy Scale: Good     Standing balance support: Bilateral upper extremity supported, During functional activity, Reliant on assistive device for balance Standing balance-Leahy Scale: Poor                             ADL either performed or assessed with clinical judgement   ADL Overall ADL's : Needs assistance/impaired Eating/Feeding: Independent   Grooming: Wash/dry hands   Upper Body Bathing: Min guard   Lower Body Bathing: Moderate assistance   Upper Body Dressing : Min guard   Lower Body Dressing: Moderate assistance   Toilet Transfer: Min guard   Toileting- Clothing Manipulation and Hygiene: Moderate assistance       Functional mobility during ADLs: Minimal assistance;Rolling walker (2 wheels) General  ADL Comments: needed verbal cues for safety with RW and correct sequence. Pt demonstrates weight bearing that could be more than 50%     Vision Baseline Vision/History: 1 Wears glasses Ability to See in Adequate Light: 0 Adequate Patient Visual Report: No change from baseline Vision Assessment?: No apparent visual deficits     Perception      Praxis      Pertinent Vitals/Pain Pain Assessment Pain Assessment: Faces Faces Pain Scale: Hurts a little bit Pain Location: LLE Pain Descriptors / Indicators: Sore Pain Intervention(s): Monitored during session, Premedicated before session, Repositioned, Limited activity within patient's tolerance     Hand Dominance Right   Extremity/Trunk Assessment Upper Extremity Assessment Upper Extremity Assessment: LUE deficits/detail LUE Deficits / Details: decreased shoulder flexion less than 45 degrees AROM. pt is able to complete full 180 degrees but with R UE lifting L UE. pt with full activation of elbow wrist and hand AROM. Pt reports pain with abduction reaching behind   Lower Extremity Assessment Lower Extremity Assessment: Defer to PT evaluation   Cervical / Trunk Assessment Cervical / Trunk Assessment: Normal   Communication Communication Communication: No difficulties   Cognition Arousal/Alertness: Awake/alert Behavior During Therapy: WFL for tasks assessed/performed Overall Cognitive Status: Within Functional Limits for tasks assessed                                       General Comments       Exercises     Shoulder Instructions      Home Living Family/patient expects to be discharged to:: Skilled nursing facility Living Arrangements: Spouse/significant other Available Help at Discharge: Family;Available PRN/intermittently Type of Home: House Home Access: Stairs to enter [employer] of Steps: 6 Entrance Stairs-Rails: Right;Left;Can reach both Home Layout: One level         Bathroom Toilet: Standard     Home Equipment: Agricultural consultant (2 wheels);BSC/3in1;Cane - single point   Additional Comments: wife has hx of multiple back surgeries and hip replacement so is limited in lift abilities.      Prior Functioning/Environment Prior Level of Function : Independent/Modified Independent;Driving             Mobility Comments:  Pt reports he was independent without AD but since fall resulting in injury pt has fallen again. Pt reports he assists his wife at baseline (she uses [facility] & he sometimes assists her with posterior peri hygiene). Pt notes he was driving prior to admission. ADLs Comments: Pt reports independence with bathing, dressing, cooking, & cleaning.        OT Problem List: Decreased activity tolerance;Impaired balance (sitting and/or standing);Decreased safety awareness;Decreased knowledge of use of DME or AE;Decreased knowledge of precautions;Obesity;Decreased coordination;Decreased strength      OT Treatment/Interventions: Self-care/ADL training;Therapeutic exercise;Energy conservation;DME and/or AE instruction;Manual therapy;Modalities;Therapeutic activities;Patient/family education;Balance training    OT Goals(Current goals can be found in the care plan section) Acute Rehab OT Goals Patient Stated Goal: to get stronger and feel safe at home. Right now i just dont feel safe at home OT Goal Formulation: With patient Time For Goal Achievement: 10/25/22 Potential to Achieve Goals: Good  OT Frequency: Min 2X/week    Co-evaluation              AM-PAC OT "6 Clicks" Daily Activity     Outcome Measure Help from another person eating meals?: None Help from another person taking care of  personal grooming?: None Help from another person toileting, which includes using toliet, bedpan, or urinal?: A Little Help from another person bathing (including washing, rinsing, drying)?: A Little Help from another person to put on and taking off regular upper body clothing?: A Little Help from another person to put on and taking off regular lower body clothing?: A Lot 6 Click Score: 19   End of Session Equipment Utilized During Treatment: Gait belt;Rolling walker (2 wheels);Left knee immobilizer Pattricia Boss) Nurse Communication: Mobility status;Precautions;Weight bearing status  Activity Tolerance: Patient  tolerated treatment well Patient left: in chair;with call bell/phone within reach;with chair alarm set  OT Visit Diagnosis: Unsteadiness on feet (R26.81);Muscle weakness (generalized) (M62.81)                Time: 9604-5409 OT Time Calculation (min): 22 min Charges:  OT General Charges $OT Visit: 1 Visit OT Evaluation $OT Eval Moderate Complexity: 1 Mod   Brynn, OTR/L  Acute Rehabilitation Services Office: [phone] .   Mateo Flow 10/11/2022, 2:20 PM

## 2022-10-12 ENCOUNTER — Encounter (HOSPITAL_COMMUNITY): Payer: Self-pay | Admitting: Orthopedic Surgery

## 2022-10-12 DIAGNOSIS — E785 Hyperlipidemia, unspecified: Secondary | ICD-10-CM | POA: Diagnosis present

## 2022-10-12 DIAGNOSIS — S76112A Strain of left quadriceps muscle, fascia and tendon, initial encounter: Secondary | ICD-10-CM | POA: Diagnosis present

## 2022-10-12 DIAGNOSIS — Z7982 Long term (current) use of aspirin: Secondary | ICD-10-CM | POA: Diagnosis not present

## 2022-10-12 DIAGNOSIS — Z9049 Acquired absence of other specified parts of digestive tract: Secondary | ICD-10-CM | POA: Diagnosis not present

## 2022-10-12 DIAGNOSIS — Z87891 Personal history of nicotine dependence: Secondary | ICD-10-CM | POA: Diagnosis not present

## 2022-10-12 DIAGNOSIS — R296 Repeated falls: Secondary | ICD-10-CM | POA: Diagnosis present

## 2022-10-12 DIAGNOSIS — I2510 Atherosclerotic heart disease of native coronary artery without angina pectoris: Secondary | ICD-10-CM | POA: Diagnosis present

## 2022-10-12 DIAGNOSIS — E039 Hypothyroidism, unspecified: Secondary | ICD-10-CM | POA: Diagnosis present

## 2022-10-12 DIAGNOSIS — I10 Essential (primary) hypertension: Secondary | ICD-10-CM | POA: Diagnosis present

## 2022-10-12 DIAGNOSIS — S76119A Strain of unspecified quadriceps muscle, fascia and tendon, initial encounter: Secondary | ICD-10-CM | POA: Diagnosis present

## 2022-10-12 DIAGNOSIS — Z955 Presence of coronary angioplasty implant and graft: Secondary | ICD-10-CM | POA: Diagnosis not present

## 2022-10-12 DIAGNOSIS — I252 Old myocardial infarction: Secondary | ICD-10-CM | POA: Diagnosis not present

## 2022-10-12 DIAGNOSIS — Z791 Long term (current) use of non-steroidal anti-inflammatories (NSAID): Secondary | ICD-10-CM | POA: Diagnosis not present

## 2022-10-12 DIAGNOSIS — Z79899 Other long term (current) drug therapy: Secondary | ICD-10-CM | POA: Diagnosis not present

## 2022-10-12 DIAGNOSIS — M1990 Unspecified osteoarthritis, unspecified site: Secondary | ICD-10-CM | POA: Diagnosis present

## 2022-10-12 DIAGNOSIS — Z79890 Hormone replacement therapy (postmenopausal): Secondary | ICD-10-CM | POA: Diagnosis not present

## 2022-10-12 DIAGNOSIS — W19XXXA Unspecified fall, initial encounter: Secondary | ICD-10-CM | POA: Diagnosis present

## 2022-10-12 MED ORDER — HYDROCODONE-ACETAMINOPHEN 7.5-325 MG PO TABS: 1.0000 | ORAL_TABLET | Freq: Four times a day (QID) | ORAL | 0 refills | Status: DC | PRN

## 2022-10-12 NOTE — Discharge Summary (Addendum)
Patient ID: Charles Brooks MRN: [id number] DOB/AGE: 09/02/1950 72 y.o.  Admit date: 10/10/2022 Discharge date: 10/13/2022  Primary Diagnosis: Left quadriceps tendon rupture  Admission Diagnoses: s/p left quadriceps tendon repair Past Medical History:  Diagnosis Date   Aneurysm    in head per pt that sealed itself in 2007   Arthritis    CAD (coronary artery disease), native coronary artery    DES RCA and PTCA RVM 07/2014, LVEF 55%   Cellulitis and abscess 08/08/2015   CKD (chronic kidney disease) stage 3, GFR 30-59 ml/min    Essential hypertension    History of kidney stones    Hyperkalemia 07/06/2019   Hyperlipidemia    Hypothyroidism    STEMI (ST elevation myocardial infarction)    07/2014   Discharge Diagnoses:   Principal Problem:   Traumatic rupture of left quadriceps tendon Active Problems:   Traumatic rupture of quadriceps tendon  Estimated body mass index is 25.77 kg/m as calculated from the following:   Height as of this encounter: 6' (1.829 m).   Weight as of this encounter: 86.2 kg.  Procedure:  Procedure(s) (LRB): OPEN REPAIR QUADRICEP TENDON (Left)   Consults: None  HPI: Charles Brooks Is a 72 year old male was seen in our office following a fall at.  He was noted to have a acute quadriceps tendon rupture which was indicated for surgery to restore the extensor mechanism.  He presented to the hospital on 10/10/2022 and underwent left quadriceps tendon repair.  He was admitted overnight for monitoring, physical therapy, and evaluation for consideration of SNF.    Laboratory Data: Admission on 10/10/2022  Component Date Value Ref Range Status   MRSA, PCR 10/10/2022 NEGATIVE  NEGATIVE Final   Staphylococcus aureus 10/10/2022 POSITIVE (A)  NEGATIVE Final   Comment: (NOTE) The Xpert SA Assay (FDA approved for NASAL specimens in patients 56 years of age and older), is one component of a comprehensive surveillance program. It is not intended to diagnose  infection nor to guide or monitor treatment. Performed at [facility], [address], [place], [place] [address]    WBC 10/10/2022 9.2  4.0 - 10.5 K/uL Final   RBC 10/10/2022 3.58 (L)  4.22 - 5.81 MIL/uL Final   Hemoglobin 10/10/2022 11.6 (L)  13.0 - 17.0 g/dL Final   HCT 62/13/0865 35.7 (L)  39.0 - 52.0 % Final   MCV 10/10/2022 99.7  80.0 - 100.0 fL Final   MCH 10/10/2022 32.4  26.0 - 34.0 pg Final   MCHC 10/10/2022 32.5  30.0 - 36.0 g/dL Final   RDW 78/46/9629 13.0  11.5 - 15.5 % Final   Platelets 10/10/2022 223  150 - 400 K/uL Final   nRBC 10/10/2022 0.0  0.0 - 0.2 % Final   Performed at [facility], [address] [place], [place] [address]   Creatinine, Ser 10/10/2022 1.86 (H)  0.61 - 1.24 mg/dL Final   GFR, Estimated 10/10/2022 38 (L)  >60 mL/min Final   Comment: (NOTE) Calculated using the CKD-EPI Creatinine Equation (2021) Performed at [facility], [address], [place], [place] [address]   Hospital Outpatient Visit on 10/07/2022  Component Date Value Ref Range Status   WBC 10/07/2022 12.9 (H)  4.0 - 10.5 K/uL Final   RBC 10/07/2022 3.94 (L)  4.22 - 5.81 MIL/uL Final   Hemoglobin 10/07/2022 12.7 (L)  13.0 - 17.0 g/dL Final   HCT 04/16/2535 40.6  39.0 - 52.0 % Final  MCV 10/07/2022 103.0 (H)  80.0 - 100.0 fL Final   MCH 10/07/2022 32.2  26.0 - 34.0 pg Final   MCHC 10/07/2022 31.3  30.0 - 36.0 g/dL Final   RDW 00/86/7619 13.2  11.5 - 15.5 % Final   Platelets 10/07/2022 243  150 - 400 K/uL Final   nRBC 10/07/2022 0.0  0.0 - 0.2 % Final   Performed at [facility], [address] [place], [place] [address]   Sodium 10/07/2022 135  135 - 145 mmol/L Final   Potassium 10/07/2022 4.7  3.5 - 5.1 mmol/L Final   Chloride 10/07/2022 106  98 - 111 mmol/L Final   CO2 10/07/2022 21 (L)  22 - 32 mmol/L Final   Glucose, Bld 10/07/2022 90  70 - 99 mg/dL Final   Glucose reference range applies only to samples taken after fasting for at least  8 hours.   BUN 10/07/2022 36 (H)  8 - 23 mg/dL Final   Creatinine, Ser 10/07/2022 2.01 (H)  0.61 - 1.24 mg/dL Final   Calcium 67/05/4579 9.1  8.9 - 10.3 mg/dL Final   GFR, Estimated 10/07/2022 35 (L)  >60 mL/min Final   Comment: (NOTE) Calculated using the CKD-EPI Creatinine Equation (2021)    Anion gap 10/07/2022 8  5 - 15 Final   Performed at [facility], [address] [place], [place] [address]     X-Rays:MR SHOULDER LEFT WO CONTRAST  Result Date: 10/10/2022 CLINICAL DATA:  Left shoulder pain since September 20, 2022 EXAM: MRI OF THE LEFT SHOULDER WITHOUT CONTRAST TECHNIQUE: Multiplanar, multisequence MR imaging of the shoulder was performed. No intravenous contrast was administered. COMPARISON:  None Available. FINDINGS: Rotator cuff: Complete tear of the supraspinatus and infraspinatus tendons with 4.2 cm of retraction. Teres minor tendon is intact. Moderate tendinosis of the subscapularis tendon with a small partial-thickness tear. Muscles: Mild atrophy of the supraspinatus muscle. Mild muscle strain of the infraspinatus and teres minor muscles. Biceps Long Head: Complete tear of the proximal long head of the biceps tendon. Acromioclavicular Joint: Moderate arthropathy of the acromioclavicular joint. Small amount of subacromial/subdeltoid bursal fluid. Glenohumeral Joint: Large joint effusion. Mild partial-thickness cartilage loss of the glenohumeral joint. Labrum: Grossly intact, but evaluation is limited by lack of intraarticular fluid/contrast. Bones: No fracture or dislocation. No aggressive osseous lesion. Other: No fluid collection or hematoma. IMPRESSION: 1. Complete tear of the supraspinatus and infraspinatus tendons with 4.2 cm of retraction. 2. Moderate tendinosis of the subscapularis tendon with a small partial-thickness tear. 3. Mild atrophy of the supraspinatus muscle. Mild muscle strain of the infraspinatus and teres minor muscles. 4. Complete tear of the proximal  long head of the biceps tendon. Electronically Signed   By: Elige Ko M.D.   On: 10/10/2022 06:34   DG Shoulder Left  Result Date: 09/20/2022 CLINICAL DATA:  Pain after fall EXAM: LEFT SHOULDER - 3 VIEW COMPARISON:  None Available. FINDINGS: No fracture or dislocation. Osteopenia. Minimal osteophytes along the AC joint. Preserved glenohumeral joint. IMPRESSION: Osteopenia.  Slight degenerative change Electronically Signed   By: Karen Kays M.D.   On: 09/20/2022 17:04   DG Knee Complete 4 Views Left  Result Date: 09/20/2022 CLINICAL DATA:  Pain after fall EXAM: LEFT KNEE - COMPLETE 4 VIEW COMPARISON:  None Available. FINDINGS: No fracture or dislocation. Preserved joint spaces. Small joint effusion on lateral view. Minimal osteophytes of the lateral compartment and patellofemoral joint. Vascular calcifications. IMPRESSION: Minimal degenerative changes.  Vascular calcifications. Electronically  Signed   By: Karen Kays M.D.   On: 09/20/2022 17:03    EKG: Orders placed or performed in visit on 09/08/22   EKG 12-Lead     Hospital Course: ZAYVIER CARAVELLO is a 72 y.o. who was admitted to Hospital. They were brought to the operating room on 10/10/2022 and underwent Procedure(s): OPEN REPAIR QUADRICEP TENDON.  Patient tolerated the procedure well and was later transferred to the recovery room and then to the orthopaedic floor for postoperative care.  They were given PO and IV analgesics for pain control following their surgery.  They were given 24 hours of postoperative antibiotics of  Anti-infectives (From admission, onward)    Start     Dose/Rate Route Frequency Ordered Stop   10/11/22 0600  ceFAZolin (ANCEF) IVPB 2g/100 mL premix        2 g 200 mL/hr over 30 Minutes Intravenous On call to O.R. 10/10/22 1207 10/10/22 1347      and started on DVT prophylaxis in the form of Lovenox.   PT and OT were ordered .Marland Kitchen  Discharge planning consulted to help with postop disposition and equipment needs.   Patient had an uneventful night on the evening of surgery.  They started to get up OOB with therapy on day one..  Continued to work with therapy into day two  , the patient had progressed with therapy and meeting their goals..  Patient was seen in rounds and was ready to go to a SNF as he has limited assistance at home. Marland KitchenHe should be on aspirin 81mg  daily for DVT prophylaxis upon discharge.    Diet: Regular diet Activity:PWB 50% Left lower extremity with brace on in extension at all times except showering. Has waterproof aquacell bandage that will stay on till post op, ace wrap can be removed.  Follow-up:in 2 weeks Disposition - Skilled nursing facility Discharged Condition: good    Allergies as of 10/12/2022       Reactions   Codeine Hives        Medication List     STOP taking these medications    meloxicam 7.5 MG tablet Commonly known as: MOBIC       TAKE these medications    aspirin EC 81 MG tablet Take 1 tablet (81 mg total) by mouth daily with breakfast.   atorvastatin 40 MG tablet Commonly known as: LIPITOR Take 1 tablet (40 mg total) by mouth daily.   cyanocobalamin 1000 MCG tablet Commonly known as: VITAMIN B12 Take 1,000 mcg by mouth daily.   gabapentin 100 MG capsule Commonly known as: NEURONTIN Take 1 capsule (100 mg total) by mouth at bedtime.   HYDROcodone-acetaminophen 7.5-325 MG tablet Commonly known as: NORCO Take 1-2 tablets by mouth every 6 (six) hours as needed for moderate pain.   levothyroxine 50 MCG tablet Commonly known as: SYNTHROID Take 50 mcg by mouth daily before breakfast.   metoprolol tartrate 25 MG tablet Commonly known as: LOPRESSOR Take 25 mg by mouth 2 (two) times daily.   nitroGLYCERIN 0.4 MG SL tablet Commonly known as: NITROSTAT Place 1 tablet (0.4 mg total) under the tongue every 5 (five) minutes x 3 doses as needed for chest pain.   zinc gluconate 50 MG tablet Take 50 mg by mouth daily.         Signed: Dion Saucier PA-C Orthopaedic Surgery 10/12/2022, 8:45 AM

## 2022-10-12 NOTE — Discharge Instructions (Signed)
Orthopedic discharge instructions:  Blood clot prevention: take aspirin 81 mg daily for blood prevention.  This Should be taken for 6 weeks  Weightbearing: You are 50% weightbearing only but your weight on the left leg with the brace on.  The brace should always be locked straight until we see you at the 2 week visit.  The brace can be removed after changing clothes, bathing however the leg needs to  remain straight. Use a walker to help ambulate.   ROM: No range of motion at this time of the knee.   Pain medication : Take tyenol and ibuprofen as needed for pain. For severe pain take hydrocodone as prescribed. Dressings: the have an Ace wrap around the knee which helps with swelling, there is a waterproof bandage below this.  The waterproof bandage  should stay on the knee until follow-up in 2 weeks.  Follow Up: Call our office at [phone] to make a 2 week postoperative appointment with Dr. Aundria Rud or Dion Saucier PA-C. Call this number for any questions, medication refills, or concerns

## 2022-10-12 NOTE — Progress Notes (Signed)
Physical Therapy Treatment Patient Details Name: Charles Brooks MRN: [id number] DOB: 1950-07-25 Today's Date: 10/12/2022   History of Present Illness Pt is a 71 y/o M admitted on 10/10/22 for scheduled L quad tendon repair. PMH: anuerysm, CAD, CKD 3, HTN, HLD, hypothyroidism, STEMI (07/2014)    PT Comments    Pt seen for PT tx with pt agreeable. Pt reports more soreness & requires more assistance to lift LLE against gravity today as compared to yesterday. Pt performs LLE strengthening exercises with AAROM PRN & cuing for technique. Pt is able to increase gait distance with RW & supervision on this date, but requires ongoing cuing re: gait pattern & 50% PWB LLE. Pt with very poor eccentric control during stand>sit. Pt would benefit from ongoing PT services to address strengthening, balance, endurance, gait & stair negotiation prior to return home with wife who can provide very little to no assistance.    Recommendations for follow up therapy are one component of a multi-disciplinary discharge planning process, led by the attending physician.  Recommendations may be updated based on patient status, additional functional criteria and insurance authorization.  Follow Up Recommendations  Can patient physically be transported by private vehicle: Yes    Assistance Recommended at Discharge Intermittent Supervision/Assistance  Patient can return home with the following A little help with bathing/dressing/bathroom;A little help with walking and/or transfers;Assistance with cooking/housework;Assist for transportation;Help with stairs or ramp for entrance   Equipment Recommendations  None recommended by PT    Recommendations for Other Services       Precautions / Restrictions Precautions Precautions: Fall Required Braces or Orthoses: Other Brace Other Brace: L bledsoe brace locked in full extension at all times Restrictions Weight Bearing Restrictions: Yes LLE Weight Bearing: Partial weight  bearing LLE Partial Weight Bearing Percentage or Pounds: 50%     Mobility  Bed Mobility               General bed mobility comments: not tested, pt received & left in recliner    Transfers Overall transfer level: Needs assistance Equipment used: Rolling walker (2 wheels) Transfers: Sit to/from Stand Sit to Stand: Supervision, Min guard           General transfer comment: STS from recliner with extra time to power up to standing & transition BUE from armrests to RW, poor eccentric lowering during stand>sit.    Ambulation/Gait Ambulation/Gait assistance: Supervision Gait Distance (Feet): 100 Feet Assistive device: Rolling walker (2 wheels) Gait Pattern/deviations: Decreased step length - right, Decreased step length - left, Decreased stride length, Decreased dorsiflexion - left Gait velocity: decreased     General Gait Details: PT educates pt on step to pattern & to push vs picking up RW to advance it. Question pt's ability to maintain only 50% weight bearing through LLE during gait but PT attempts to educate & reinforce throughout gait.   Stairs             Wheelchair Mobility    Modified Rankin (Stroke Patients Only)       Balance Overall balance assessment: Needs assistance         Standing balance support: Bilateral upper extremity supported, During functional activity, Reliant on assistive device for balance Standing balance-Leahy Scale: Fair                              Cognition Arousal/Alertness: Awake/alert Behavior During Therapy: WFL for tasks assessed/performed Overall Cognitive Status: Within  Functional Limits for tasks assessed                                          Exercises General Exercises - Lower Extremity Hip ABduction/ADduction: AROM, Strengthening, Left, 20 reps (hip abduction slides) Straight Leg Raises: AAROM, Strengthening, Left, 10 reps    General Comments        Pertinent  Vitals/Pain Pain Assessment Pain Assessment: 0-10 Pain Score: 4  Pain Location: LLE Pain Descriptors / Indicators: Discomfort Pain Intervention(s): Monitored during session, Repositioned    Home Living                          Prior Function            PT Goals (current goals can now be found in the care plan section) Acute Rehab PT Goals Patient Stated Goal: go to rehab before going home, return to independence prior to d/c to reduce fall risk once home PT Goal Formulation: With patient Time For Goal Achievement: 10/25/22 Potential to Achieve Goals: Good Progress towards PT goals: Progressing toward goals    Frequency    Min 4X/week      PT Plan Discharge plan needs to be updated    Co-evaluation              AM-PAC PT "6 Clicks" Mobility   Outcome Measure  Help needed turning from your back to your side while in a flat bed without using bedrails?: None Help needed moving from lying on your back to sitting on the side of a flat bed without using bedrails?: A Little Help needed moving to and from a bed to a chair (including a wheelchair)?: A Little Help needed standing up from a chair using your arms (e.g., wheelchair or bedside chair)?: A Little Help needed to walk in hospital room?: A Little Help needed climbing 3-5 steps with a railing? : A Lot 6 Click Score: 18    End of Session Equipment Utilized During Treatment:  (LLE bledsoe brace) Activity Tolerance: Patient tolerated treatment well Patient left: in chair;with call bell/phone within reach   PT Visit Diagnosis: Unsteadiness on feet (R26.81);Other abnormalities of gait and mobility (R26.89);Difficulty in walking, not elsewhere classified (R26.2);Muscle weakness (generalized) (M62.81);Pain Pain - Right/Left: Left Pain - part of body: Knee;Leg     Time: 1914-7829 PT Time Calculation (min) (ACUTE ONLY): 15 min  Charges:  $Gait Training: 8-22 mins                     Aleda Grana,  PT, DPT 10/12/22, 10:48 AM   Sandi Mariscal 10/12/2022, 10:47 AM

## 2022-10-12 NOTE — TOC Progression Note (Addendum)
Transition of Care [facility]) - Progression Note    Patient Details  Name: Charles Brooks MRN: [id number] Date of Birth: 1950/09/18  Transition of Care [facility]) CM/SW Contact  Lorri Frederick, LCSW Phone Number: 10/12/2022, 9:54 AM  Clinical Narrative:   CSW spoke with pt, he would like to accept offer at [facility].  Confirmed with Destiny that [facility] can accept today.  SNF choice and additional info that was requested in voicemail given to HTA, left message.  1130: CSW spoke with Stephanie/HTA.  She is not aware of any auth in process for this pt, took information and said she will put in request.  Aware that pt stable for DC today.   Discussed with pt that he does not require ambulance transport.  He does have someone who can give him a ride and will call them.    Expected Discharge Plan: Skilled Nursing Facility Barriers to Discharge: Continued Medical Work up, SNF Pending bed offer  Expected Discharge Plan and Services In-house Referral: Clinical Social Work   Post Acute Care Choice: Skilled Nursing Facility Living arrangements for the past 2 months: Single Family Home                                       Social Determinants of Health (SDOH) Interventions SDOH Screenings   Tobacco Use: Medium Risk (10/10/2022)    Readmission Risk Interventions     No data to display

## 2022-10-12 NOTE — Anesthesia Postprocedure Evaluation (Signed)
Anesthesia Post Note  Patient: Charles Brooks  Procedure(s) Performed: OPEN REPAIR QUADRICEP TENDON (Left)     Patient location during evaluation: PACU Anesthesia Type: General Level of consciousness: awake and alert Pain management: pain level controlled Vital Signs Assessment: post-procedure vital signs reviewed and stable Respiratory status: spontaneous breathing, nonlabored ventilation, respiratory function stable and patient connected to nasal cannula oxygen Cardiovascular status: blood pressure returned to baseline and stable Postop Assessment: no apparent nausea or vomiting Anesthetic complications: no   No notable events documented.  Last Vitals:  Vitals:   10/11/22 2116 10/12/22 0544  BP: 121/68 113/72  Pulse: 62 63  Resp: 18 16  Temp: 36.7 C 36.6 C  SpO2: 97% 97%    Last Pain:  Vitals:   10/12/22 0544  TempSrc: Oral  PainSc:                  [name]

## 2022-10-12 NOTE — Plan of Care (Signed)
?  Problem: Activity: ?Goal: Risk for activity intolerance will decrease ?Outcome: Progressing ?  ?Problem: Nutrition: ?Goal: Adequate nutrition will be maintained ?Outcome: Progressing ?  ?Problem: Elimination: ?Goal: Will not experience complications related to urinary retention ?Outcome: Progressing ?  ?Problem: Pain Managment: ?Goal: General experience of comfort will improve ?Outcome: Progressing ?  ?Problem: Safety: ?Goal: Ability to remain free from injury will improve ?Outcome: Progressing ?  ?Problem: Skin Integrity: ?Goal: Risk for impaired skin integrity will decrease ?Outcome: Progressing ?  ?

## 2022-10-12 NOTE — Progress Notes (Signed)
   Subjective:  Charles Brooks is a 72 y.o. male, 2 Days Post-Op    s/p Procedure(s): OPEN REPAIR QUADRICEP TENDON   Patient reports pain as mild to moderate.  Reports did well with OT. Denies N/T, pain well controlled,   Objective:   VITALS:   Vitals:   10/11/22 2116 10/12/22 0544 10/12/22 0744 10/12/22 0824  BP: 121/68 113/72 116/66   Pulse: 62 63 (!) 52 61  Resp: Temp: 98.1 F (36.7 C) 97.9 F (36.6 C) 98 F (36.7 C)   TempSrc: Oral Oral Oral   SpO2: 97% 97% 99%   Weight:      Height:        NAD in hospital bed  LLE:  Neurologically intact Neurovascular intact Sensation intact distally Intact pulses distally Dorsiflexion/Plantar flexion intact Incision: dressing C/D/I Compartment soft TROM brace on properly, calf soft, nontender.  Able to elevate leg with brace on  Lab Results  Component Value Date   WBC 9.2 10/10/2022   HGB 11.6 (L) 10/10/2022   HCT 35.7 (L) 10/10/2022   MCV 99.7 10/10/2022   PLT 223 10/10/2022   BMET    Component Value Date/Time   NA 135 10/07/2022 1116   NA 138 11/18/2021 0953   K 4.7 10/07/2022 1116   CL 106 10/07/2022 1116   CO2 21 (L) 10/07/2022 1116   GLUCOSE 90 10/07/2022 1116   BUN 36 (H) 10/07/2022 1116   BUN 21 11/18/2021 0953   CREATININE 1.86 (H) 10/10/2022 1831   CALCIUM 9.1 10/07/2022 1116   EGFR 38 (L) 11/18/2021 0953   GFRNONAA 38 (L) 10/10/2022 1831     Assessment/Plan: 2 Days Post-Op   Principal Problem:   Traumatic rupture of left quadriceps tendon Active Problems:   Traumatic rupture of quadriceps tendon   Advance diet Up with therapy  Dispo:  SNF , pending authorization. Ok for discharge once approved . Pain med rx printed .   Weightbearing Status: 50%  PWB with walker, locked in extension.  DVT Prophylaxis: Lovenox aspirin   Arbie Cookey 10/12/2022, 8:39 AM  Dion Saucier PA-C  Physician Assistant with Dr. Rebekah [place]

## 2022-10-13 DIAGNOSIS — Z9181 History of falling: Secondary | ICD-10-CM | POA: Diagnosis not present

## 2022-10-13 DIAGNOSIS — I10 Essential (primary) hypertension: Secondary | ICD-10-CM | POA: Diagnosis not present

## 2022-10-13 DIAGNOSIS — N183 Chronic kidney disease, stage 3 unspecified: Secondary | ICD-10-CM | POA: Diagnosis not present

## 2022-10-13 DIAGNOSIS — F4323 Adjustment disorder with mixed anxiety and depressed mood: Secondary | ICD-10-CM | POA: Diagnosis not present

## 2022-10-13 DIAGNOSIS — R41841 Cognitive communication deficit: Secondary | ICD-10-CM | POA: Diagnosis not present

## 2022-10-13 DIAGNOSIS — Z299 Encounter for prophylactic measures, unspecified: Secondary | ICD-10-CM | POA: Diagnosis not present

## 2022-10-13 DIAGNOSIS — I2510 Atherosclerotic heart disease of native coronary artery without angina pectoris: Secondary | ICD-10-CM | POA: Diagnosis not present

## 2022-10-13 DIAGNOSIS — M25512 Pain in left shoulder: Secondary | ICD-10-CM | POA: Diagnosis not present

## 2022-10-13 DIAGNOSIS — M1990 Unspecified osteoarthritis, unspecified site: Secondary | ICD-10-CM | POA: Diagnosis not present

## 2022-10-13 DIAGNOSIS — S76112D Strain of left quadriceps muscle, fascia and tendon, subsequent encounter: Secondary | ICD-10-CM | POA: Diagnosis not present

## 2022-10-13 DIAGNOSIS — M6281 Muscle weakness (generalized): Secondary | ICD-10-CM | POA: Diagnosis not present

## 2022-10-13 DIAGNOSIS — R2689 Other abnormalities of gait and mobility: Secondary | ICD-10-CM | POA: Diagnosis not present

## 2022-10-13 DIAGNOSIS — I700 Atherosclerosis of aorta: Secondary | ICD-10-CM | POA: Diagnosis not present

## 2022-10-13 DIAGNOSIS — M25562 Pain in left knee: Secondary | ICD-10-CM | POA: Diagnosis not present

## 2022-10-13 DIAGNOSIS — E039 Hypothyroidism, unspecified: Secondary | ICD-10-CM | POA: Diagnosis not present

## 2022-10-13 DIAGNOSIS — E785 Hyperlipidemia, unspecified: Secondary | ICD-10-CM | POA: Diagnosis not present

## 2022-10-13 NOTE — Plan of Care (Signed)
  Problem: Skin Integrity: Goal: Risk for impaired skin integrity will decrease 10/13/2022 1047 by Milderd Meager, RN Outcome: Completed/Met 10/13/2022 1047 by Milderd Meager, RN Outcome: Adequate for Discharge   Problem: Safety: Goal: Ability to remain free from injury will improve 10/13/2022 1047 by Milderd Meager, RN Outcome: Completed/Met 10/13/2022 1047 by Milderd Meager, RN Outcome: Adequate for Discharge   Problem: Pain Managment: Goal: General experience of comfort will improve 10/13/2022 1047 by Milderd Meager, RN Outcome: Completed/Met 10/13/2022 1047 by Milderd Meager, RN Outcome: Adequate for Discharge   Problem: Elimination: Goal: Will not experience complications related to bowel motility 10/13/2022 1047 by Milderd Meager, RN Outcome: Completed/Met 10/13/2022 1047 by Milderd Meager, RN Outcome: Adequate for Discharge Goal: Will not experience complications related to urinary retention 10/13/2022 1047 by Milderd Meager, RN Outcome: Completed/Met 10/13/2022 1047 by Milderd Meager, RN Outcome: Adequate for Discharge   Problem: Coping: Goal: Level of anxiety will decrease 10/13/2022 1047 by Milderd Meager, RN Outcome: Completed/Met 10/13/2022 1047 by Milderd Meager, RN Outcome: Adequate for Discharge   Problem: Nutrition: Goal: Adequate nutrition will be maintained 10/13/2022 1047 by Milderd Meager, RN Outcome: Completed/Met 10/13/2022 1047 by Milderd Meager, RN Outcome: Adequate for Discharge   Problem: Activity: Goal: Risk for activity intolerance will decrease 10/13/2022 1047 by Milderd Meager, RN Outcome: Completed/Met 10/13/2022 1047 by Milderd Meager, RN Outcome: Adequate for Discharge   Problem: Clinical Measurements: Goal: Ability to maintain clinical measurements within normal limits will improve 10/13/2022 1047 by Milderd Meager, RN Outcome: Completed/Met 10/13/2022 1047 by Milderd Meager, RN Outcome: Adequate for Discharge Goal: Will  remain free from infection 10/13/2022 1047 by Milderd Meager, RN Outcome: Completed/Met 10/13/2022 1047 by Milderd Meager, RN Outcome: Adequate for Discharge Goal: Diagnostic test results will improve 10/13/2022 1047 by Milderd Meager, RN Outcome: Completed/Met 10/13/2022 1047 by Milderd Meager, RN Outcome: Adequate for Discharge Goal: Respiratory complications will improve 10/13/2022 1047 by Milderd Meager, RN Outcome: Completed/Met 10/13/2022 1047 by Milderd Meager, RN Outcome: Adequate for Discharge Goal: Cardiovascular complication will be avoided 10/13/2022 1047 by Milderd Meager, RN Outcome: Completed/Met 10/13/2022 1047 by Milderd Meager, RN Outcome: Adequate for Discharge   Problem: Health Behavior/Discharge Planning: Goal: Ability to manage health-related needs will improve 10/13/2022 1047 by Milderd Meager, RN Outcome: Completed/Met 10/13/2022 1047 by Milderd Meager, RN Outcome: Adequate for Discharge   Problem: Education: Goal: Knowledge of General Education information will improve Description: Including pain rating scale, medication(s)/side effects and non-pharmacologic comfort measures 10/13/2022 1047 by Milderd Meager, RN Outcome: Completed/Met 10/13/2022 1047 by Milderd Meager, RN Outcome: Adequate for Discharge

## 2022-10-13 NOTE — Plan of Care (Signed)

## 2022-10-13 NOTE — Progress Notes (Signed)
Discharge report called to [facility] at [phone]; report Deanna Artis, LPN.  AVS provided to patients family who is transporting to rehab facility.

## 2022-10-13 NOTE — TOC Transition Note (Signed)
Transition of Care [facility]) - CM/SW Discharge Note   Patient Details  Name: Charles Brooks MRN: [id number] Date of Birth: December 21, 1950  Transition of Care [facility]) CM/SW Contact:  Lorri Frederick, LCSW Phone Number: 10/13/2022, 9:35 AM   Clinical Narrative:   Pt discharging to [facility].  RN call report to [phone].   Pt brother will transport.  May need to bring pt down and to assist him getting into the vehicle.    Final next level of care: Skilled Nursing Facility Barriers to Discharge: Barriers Resolved   Patient Goals and CMS Choice CMS Medicare.gov Compare Post Acute Care list provided to:: Patient Choice offered to / list presented to : Patient  Discharge Placement                Patient chooses bed at:  [facility]) Patient to be transferred to facility by: brother Name of family member notified: wife Bonita Quin Patient and family notified of of transfer: 10/13/22  Discharge Plan and Services Additional resources added to the After Visit Summary for   In-house Referral: Clinical Social Work   Post Acute Care Choice: Skilled Nursing Facility                               Social Determinants of Health (SDOH) Interventions SDOH Screenings   Tobacco Use: Medium Risk (10/12/2022)     Readmission Risk Interventions     No data to display

## 2022-10-13 NOTE — TOC Progression Note (Signed)
Transition of Care [facility]) - Progression Note    Patient Details  Name: Charles Brooks MRN: [id number] Date of Birth: 06-15-1951  Transition of Care [facility]) CM/SW Contact  Lorri Frederick, LCSW Phone Number: 10/13/2022, 9:34 AM  Clinical Narrative:  Pt auth approved by [facility], 7 days: [id number]. CSW confirmed with [facility] that they can accept pt. CSW spoke with pt about transportation, he will call to arrange a ride.     Expected Discharge Plan: Skilled Nursing Facility Barriers to Discharge: Continued Medical Work up, SNF Pending bed offer  Expected Discharge Plan and Services In-house Referral: Clinical Social Work   Post Acute Care Choice: Skilled Nursing Facility Living arrangements for the past 2 months: Single Family Home Expected Discharge Date: 10/12/22                                     Social Determinants of Health (SDOH) Interventions SDOH Screenings   Tobacco Use: Medium Risk (10/12/2022)    Readmission Risk Interventions     No data to display

## 2022-10-14 DIAGNOSIS — I10 Essential (primary) hypertension: Secondary | ICD-10-CM | POA: Diagnosis not present

## 2022-10-14 DIAGNOSIS — M25562 Pain in left knee: Secondary | ICD-10-CM | POA: Diagnosis not present

## 2022-10-14 DIAGNOSIS — M25512 Pain in left shoulder: Secondary | ICD-10-CM | POA: Diagnosis not present

## 2022-10-14 DIAGNOSIS — I2510 Atherosclerotic heart disease of native coronary artery without angina pectoris: Secondary | ICD-10-CM | POA: Diagnosis not present

## 2022-11-03 DIAGNOSIS — Z299 Encounter for prophylactic measures, unspecified: Secondary | ICD-10-CM | POA: Diagnosis not present

## 2022-11-03 DIAGNOSIS — M25512 Pain in left shoulder: Secondary | ICD-10-CM | POA: Diagnosis not present

## 2022-11-03 DIAGNOSIS — I700 Atherosclerosis of aorta: Secondary | ICD-10-CM | POA: Diagnosis not present

## 2022-11-03 DIAGNOSIS — E039 Hypothyroidism, unspecified: Secondary | ICD-10-CM | POA: Diagnosis not present

## 2022-11-03 DIAGNOSIS — I10 Essential (primary) hypertension: Secondary | ICD-10-CM | POA: Diagnosis not present

## 2022-11-03 DIAGNOSIS — M25562 Pain in left knee: Secondary | ICD-10-CM | POA: Diagnosis not present

## 2022-11-04 DIAGNOSIS — Z9181 History of falling: Secondary | ICD-10-CM | POA: Diagnosis not present

## 2022-11-04 DIAGNOSIS — Z87891 Personal history of nicotine dependence: Secondary | ICD-10-CM | POA: Diagnosis not present

## 2022-11-04 DIAGNOSIS — I252 Old myocardial infarction: Secondary | ICD-10-CM | POA: Diagnosis not present

## 2022-11-04 DIAGNOSIS — M1712 Unilateral primary osteoarthritis, left knee: Secondary | ICD-10-CM | POA: Diagnosis not present

## 2022-11-04 DIAGNOSIS — M19012 Primary osteoarthritis, left shoulder: Secondary | ICD-10-CM | POA: Diagnosis not present

## 2022-11-04 DIAGNOSIS — S76112D Strain of left quadriceps muscle, fascia and tendon, subsequent encounter: Secondary | ICD-10-CM | POA: Diagnosis not present

## 2022-11-04 DIAGNOSIS — G629 Polyneuropathy, unspecified: Secondary | ICD-10-CM | POA: Diagnosis not present

## 2022-11-04 DIAGNOSIS — E7800 Pure hypercholesterolemia, unspecified: Secondary | ICD-10-CM | POA: Diagnosis not present

## 2022-11-04 DIAGNOSIS — N1832 Chronic kidney disease, stage 3b: Secondary | ICD-10-CM | POA: Diagnosis not present

## 2022-11-04 DIAGNOSIS — D692 Other nonthrombocytopenic purpura: Secondary | ICD-10-CM | POA: Diagnosis not present

## 2022-11-04 DIAGNOSIS — I340 Nonrheumatic mitral (valve) insufficiency: Secondary | ICD-10-CM | POA: Diagnosis not present

## 2022-11-04 DIAGNOSIS — I2510 Atherosclerotic heart disease of native coronary artery without angina pectoris: Secondary | ICD-10-CM | POA: Diagnosis not present

## 2022-11-04 DIAGNOSIS — I129 Hypertensive chronic kidney disease with stage 1 through stage 4 chronic kidney disease, or unspecified chronic kidney disease: Secondary | ICD-10-CM | POA: Diagnosis not present

## 2022-11-04 DIAGNOSIS — Z87442 Personal history of urinary calculi: Secondary | ICD-10-CM | POA: Diagnosis not present

## 2022-11-04 DIAGNOSIS — M85812 Other specified disorders of bone density and structure, left shoulder: Secondary | ICD-10-CM | POA: Diagnosis not present

## 2022-11-04 DIAGNOSIS — I700 Atherosclerosis of aorta: Secondary | ICD-10-CM | POA: Diagnosis not present

## 2022-11-04 DIAGNOSIS — Z7982 Long term (current) use of aspirin: Secondary | ICD-10-CM | POA: Diagnosis not present

## 2022-11-04 DIAGNOSIS — E039 Hypothyroidism, unspecified: Secondary | ICD-10-CM | POA: Diagnosis not present

## 2022-11-04 DIAGNOSIS — Z85828 Personal history of other malignant neoplasm of skin: Secondary | ICD-10-CM | POA: Diagnosis not present

## 2022-11-04 DIAGNOSIS — Z8616 Personal history of COVID-19: Secondary | ICD-10-CM | POA: Diagnosis not present

## 2022-11-04 DIAGNOSIS — Z86010 Personal history of colonic polyps: Secondary | ICD-10-CM | POA: Diagnosis not present

## 2022-11-04 DIAGNOSIS — I739 Peripheral vascular disease, unspecified: Secondary | ICD-10-CM | POA: Diagnosis not present

## 2022-11-10 DIAGNOSIS — M25562 Pain in left knee: Secondary | ICD-10-CM | POA: Diagnosis not present

## 2022-11-10 DIAGNOSIS — I10 Essential (primary) hypertension: Secondary | ICD-10-CM | POA: Diagnosis not present

## 2022-11-10 DIAGNOSIS — Z299 Encounter for prophylactic measures, unspecified: Secondary | ICD-10-CM | POA: Diagnosis not present

## 2022-11-10 DIAGNOSIS — Z09 Encounter for follow-up examination after completed treatment for conditions other than malignant neoplasm: Secondary | ICD-10-CM | POA: Diagnosis not present

## 2022-11-15 DIAGNOSIS — N1832 Chronic kidney disease, stage 3b: Secondary | ICD-10-CM | POA: Diagnosis not present

## 2022-11-15 DIAGNOSIS — I129 Hypertensive chronic kidney disease with stage 1 through stage 4 chronic kidney disease, or unspecified chronic kidney disease: Secondary | ICD-10-CM | POA: Diagnosis not present

## 2022-11-15 DIAGNOSIS — I2510 Atherosclerotic heart disease of native coronary artery without angina pectoris: Secondary | ICD-10-CM | POA: Diagnosis not present

## 2022-11-15 DIAGNOSIS — S76112D Strain of left quadriceps muscle, fascia and tendon, subsequent encounter: Secondary | ICD-10-CM | POA: Diagnosis not present

## 2022-12-05 DIAGNOSIS — L03116 Cellulitis of left lower limb: Secondary | ICD-10-CM | POA: Diagnosis not present

## 2022-12-05 DIAGNOSIS — L03115 Cellulitis of right lower limb: Secondary | ICD-10-CM | POA: Diagnosis not present

## 2022-12-05 DIAGNOSIS — Z299 Encounter for prophylactic measures, unspecified: Secondary | ICD-10-CM | POA: Diagnosis not present

## 2022-12-05 DIAGNOSIS — R600 Localized edema: Secondary | ICD-10-CM | POA: Diagnosis not present

## 2022-12-05 DIAGNOSIS — I10 Essential (primary) hypertension: Secondary | ICD-10-CM | POA: Diagnosis not present

## 2022-12-06 DIAGNOSIS — L97909 Non-pressure chronic ulcer of unspecified part of unspecified lower leg with unspecified severity: Secondary | ICD-10-CM | POA: Diagnosis not present

## 2022-12-06 DIAGNOSIS — I10 Essential (primary) hypertension: Secondary | ICD-10-CM | POA: Diagnosis not present

## 2022-12-06 DIAGNOSIS — L03116 Cellulitis of left lower limb: Secondary | ICD-10-CM | POA: Diagnosis not present

## 2022-12-06 DIAGNOSIS — Z299 Encounter for prophylactic measures, unspecified: Secondary | ICD-10-CM | POA: Diagnosis not present

## 2022-12-06 DIAGNOSIS — L03115 Cellulitis of right lower limb: Secondary | ICD-10-CM | POA: Diagnosis not present

## 2022-12-15 DIAGNOSIS — E039 Hypothyroidism, unspecified: Secondary | ICD-10-CM | POA: Diagnosis not present

## 2022-12-15 DIAGNOSIS — Z7189 Other specified counseling: Secondary | ICD-10-CM | POA: Diagnosis not present

## 2022-12-15 DIAGNOSIS — R5383 Other fatigue: Secondary | ICD-10-CM | POA: Diagnosis not present

## 2022-12-15 DIAGNOSIS — E7800 Pure hypercholesterolemia, unspecified: Secondary | ICD-10-CM | POA: Diagnosis not present

## 2022-12-15 DIAGNOSIS — Z1331 Encounter for screening for depression: Secondary | ICD-10-CM | POA: Diagnosis not present

## 2022-12-15 DIAGNOSIS — I10 Essential (primary) hypertension: Secondary | ICD-10-CM | POA: Diagnosis not present

## 2022-12-15 DIAGNOSIS — Z0000 Encounter for general adult medical examination without abnormal findings: Secondary | ICD-10-CM | POA: Diagnosis not present

## 2022-12-15 DIAGNOSIS — Z87891 Personal history of nicotine dependence: Secondary | ICD-10-CM | POA: Diagnosis not present

## 2022-12-15 DIAGNOSIS — Z1339 Encounter for screening examination for other mental health and behavioral disorders: Secondary | ICD-10-CM | POA: Diagnosis not present

## 2022-12-15 DIAGNOSIS — Z299 Encounter for prophylactic measures, unspecified: Secondary | ICD-10-CM | POA: Diagnosis not present

## 2023-01-03 DIAGNOSIS — Z4789 Encounter for other orthopedic aftercare: Secondary | ICD-10-CM | POA: Diagnosis not present

## 2023-01-03 DIAGNOSIS — M1711 Unilateral primary osteoarthritis, right knee: Secondary | ICD-10-CM | POA: Diagnosis not present

## 2023-01-05 DIAGNOSIS — Z4789 Encounter for other orthopedic aftercare: Secondary | ICD-10-CM | POA: Diagnosis not present

## 2023-01-05 DIAGNOSIS — S76102D Unspecified injury of left quadriceps muscle, fascia and tendon, subsequent encounter: Secondary | ICD-10-CM | POA: Diagnosis not present

## 2023-01-10 DIAGNOSIS — Z4789 Encounter for other orthopedic aftercare: Secondary | ICD-10-CM | POA: Diagnosis not present

## 2023-01-10 DIAGNOSIS — S76102D Unspecified injury of left quadriceps muscle, fascia and tendon, subsequent encounter: Secondary | ICD-10-CM | POA: Diagnosis not present

## 2023-01-12 DIAGNOSIS — S76102D Unspecified injury of left quadriceps muscle, fascia and tendon, subsequent encounter: Secondary | ICD-10-CM | POA: Diagnosis not present

## 2023-01-12 DIAGNOSIS — N1832 Chronic kidney disease, stage 3b: Secondary | ICD-10-CM | POA: Diagnosis not present

## 2023-01-12 DIAGNOSIS — I739 Peripheral vascular disease, unspecified: Secondary | ICD-10-CM | POA: Diagnosis not present

## 2023-01-12 DIAGNOSIS — Z299 Encounter for prophylactic measures, unspecified: Secondary | ICD-10-CM | POA: Diagnosis not present

## 2023-01-12 DIAGNOSIS — I10 Essential (primary) hypertension: Secondary | ICD-10-CM | POA: Diagnosis not present

## 2023-01-12 DIAGNOSIS — Z4789 Encounter for other orthopedic aftercare: Secondary | ICD-10-CM | POA: Diagnosis not present

## 2023-01-12 DIAGNOSIS — L97909 Non-pressure chronic ulcer of unspecified part of unspecified lower leg with unspecified severity: Secondary | ICD-10-CM | POA: Diagnosis not present

## 2023-01-16 DIAGNOSIS — S76102D Unspecified injury of left quadriceps muscle, fascia and tendon, subsequent encounter: Secondary | ICD-10-CM | POA: Diagnosis not present

## 2023-01-16 DIAGNOSIS — Z4789 Encounter for other orthopedic aftercare: Secondary | ICD-10-CM | POA: Diagnosis not present

## 2023-01-18 DIAGNOSIS — S76102D Unspecified injury of left quadriceps muscle, fascia and tendon, subsequent encounter: Secondary | ICD-10-CM | POA: Diagnosis not present

## 2023-01-18 DIAGNOSIS — Z4789 Encounter for other orthopedic aftercare: Secondary | ICD-10-CM | POA: Diagnosis not present

## 2023-01-24 DIAGNOSIS — S76102D Unspecified injury of left quadriceps muscle, fascia and tendon, subsequent encounter: Secondary | ICD-10-CM | POA: Diagnosis not present

## 2023-01-24 DIAGNOSIS — Z4789 Encounter for other orthopedic aftercare: Secondary | ICD-10-CM | POA: Diagnosis not present

## 2023-01-25 DIAGNOSIS — Z4789 Encounter for other orthopedic aftercare: Secondary | ICD-10-CM | POA: Diagnosis not present

## 2023-01-25 DIAGNOSIS — S76102D Unspecified injury of left quadriceps muscle, fascia and tendon, subsequent encounter: Secondary | ICD-10-CM | POA: Diagnosis not present

## 2023-01-31 DIAGNOSIS — S76102D Unspecified injury of left quadriceps muscle, fascia and tendon, subsequent encounter: Secondary | ICD-10-CM | POA: Diagnosis not present

## 2023-01-31 DIAGNOSIS — Z4789 Encounter for other orthopedic aftercare: Secondary | ICD-10-CM | POA: Diagnosis not present

## 2023-02-03 DIAGNOSIS — Z4789 Encounter for other orthopedic aftercare: Secondary | ICD-10-CM | POA: Diagnosis not present

## 2023-02-03 DIAGNOSIS — S76102D Unspecified injury of left quadriceps muscle, fascia and tendon, subsequent encounter: Secondary | ICD-10-CM | POA: Diagnosis not present

## 2023-02-06 DIAGNOSIS — Z4789 Encounter for other orthopedic aftercare: Secondary | ICD-10-CM | POA: Diagnosis not present

## 2023-02-06 DIAGNOSIS — S76102D Unspecified injury of left quadriceps muscle, fascia and tendon, subsequent encounter: Secondary | ICD-10-CM | POA: Diagnosis not present

## 2023-02-09 DIAGNOSIS — S76102D Unspecified injury of left quadriceps muscle, fascia and tendon, subsequent encounter: Secondary | ICD-10-CM | POA: Diagnosis not present

## 2023-02-09 DIAGNOSIS — Z4789 Encounter for other orthopedic aftercare: Secondary | ICD-10-CM | POA: Diagnosis not present

## 2023-02-13 DIAGNOSIS — Z4789 Encounter for other orthopedic aftercare: Secondary | ICD-10-CM | POA: Diagnosis not present

## 2023-02-13 DIAGNOSIS — S76102D Unspecified injury of left quadriceps muscle, fascia and tendon, subsequent encounter: Secondary | ICD-10-CM | POA: Diagnosis not present

## 2023-02-14 DIAGNOSIS — M1712 Unilateral primary osteoarthritis, left knee: Secondary | ICD-10-CM | POA: Diagnosis not present

## 2023-02-15 DIAGNOSIS — Z4789 Encounter for other orthopedic aftercare: Secondary | ICD-10-CM | POA: Diagnosis not present

## 2023-02-15 DIAGNOSIS — S76102D Unspecified injury of left quadriceps muscle, fascia and tendon, subsequent encounter: Secondary | ICD-10-CM | POA: Diagnosis not present

## 2023-02-22 DIAGNOSIS — Z4789 Encounter for other orthopedic aftercare: Secondary | ICD-10-CM | POA: Diagnosis not present

## 2023-02-22 DIAGNOSIS — S76102D Unspecified injury of left quadriceps muscle, fascia and tendon, subsequent encounter: Secondary | ICD-10-CM | POA: Diagnosis not present

## 2023-02-24 DIAGNOSIS — S76102D Unspecified injury of left quadriceps muscle, fascia and tendon, subsequent encounter: Secondary | ICD-10-CM | POA: Diagnosis not present

## 2023-02-24 DIAGNOSIS — Z4789 Encounter for other orthopedic aftercare: Secondary | ICD-10-CM | POA: Diagnosis not present

## 2023-02-28 DIAGNOSIS — S76102D Unspecified injury of left quadriceps muscle, fascia and tendon, subsequent encounter: Secondary | ICD-10-CM | POA: Diagnosis not present

## 2023-02-28 DIAGNOSIS — Z4789 Encounter for other orthopedic aftercare: Secondary | ICD-10-CM | POA: Diagnosis not present

## 2023-03-03 DIAGNOSIS — S76102D Unspecified injury of left quadriceps muscle, fascia and tendon, subsequent encounter: Secondary | ICD-10-CM | POA: Diagnosis not present

## 2023-03-03 DIAGNOSIS — Z4789 Encounter for other orthopedic aftercare: Secondary | ICD-10-CM | POA: Diagnosis not present

## 2023-03-07 DIAGNOSIS — Z4789 Encounter for other orthopedic aftercare: Secondary | ICD-10-CM | POA: Diagnosis not present

## 2023-03-07 DIAGNOSIS — S76102D Unspecified injury of left quadriceps muscle, fascia and tendon, subsequent encounter: Secondary | ICD-10-CM | POA: Diagnosis not present

## 2023-03-10 DIAGNOSIS — S76102D Unspecified injury of left quadriceps muscle, fascia and tendon, subsequent encounter: Secondary | ICD-10-CM | POA: Diagnosis not present

## 2023-03-10 DIAGNOSIS — Z4789 Encounter for other orthopedic aftercare: Secondary | ICD-10-CM | POA: Diagnosis not present

## 2023-03-14 DIAGNOSIS — Z4789 Encounter for other orthopedic aftercare: Secondary | ICD-10-CM | POA: Diagnosis not present

## 2023-03-14 DIAGNOSIS — S76102D Unspecified injury of left quadriceps muscle, fascia and tendon, subsequent encounter: Secondary | ICD-10-CM | POA: Diagnosis not present

## 2023-03-17 DIAGNOSIS — E039 Hypothyroidism, unspecified: Secondary | ICD-10-CM | POA: Diagnosis not present

## 2023-03-17 DIAGNOSIS — Z0000 Encounter for general adult medical examination without abnormal findings: Secondary | ICD-10-CM | POA: Diagnosis not present

## 2023-03-17 DIAGNOSIS — S76102D Unspecified injury of left quadriceps muscle, fascia and tendon, subsequent encounter: Secondary | ICD-10-CM | POA: Diagnosis not present

## 2023-03-17 DIAGNOSIS — I700 Atherosclerosis of aorta: Secondary | ICD-10-CM | POA: Diagnosis not present

## 2023-03-17 DIAGNOSIS — Z299 Encounter for prophylactic measures, unspecified: Secondary | ICD-10-CM | POA: Diagnosis not present

## 2023-03-17 DIAGNOSIS — I739 Peripheral vascular disease, unspecified: Secondary | ICD-10-CM | POA: Diagnosis not present

## 2023-03-17 DIAGNOSIS — I10 Essential (primary) hypertension: Secondary | ICD-10-CM | POA: Diagnosis not present

## 2023-03-17 DIAGNOSIS — Z23 Encounter for immunization: Secondary | ICD-10-CM | POA: Diagnosis not present

## 2023-03-17 DIAGNOSIS — Z4789 Encounter for other orthopedic aftercare: Secondary | ICD-10-CM | POA: Diagnosis not present

## 2023-03-21 DIAGNOSIS — C4492 Squamous cell carcinoma of skin, unspecified: Secondary | ICD-10-CM | POA: Diagnosis not present

## 2023-03-21 DIAGNOSIS — I10 Essential (primary) hypertension: Secondary | ICD-10-CM | POA: Diagnosis not present

## 2023-03-21 DIAGNOSIS — Z4789 Encounter for other orthopedic aftercare: Secondary | ICD-10-CM | POA: Diagnosis not present

## 2023-03-21 DIAGNOSIS — C44209 Unspecified malignant neoplasm of skin of left ear and external auricular canal: Secondary | ICD-10-CM | POA: Diagnosis not present

## 2023-03-21 DIAGNOSIS — S76102D Unspecified injury of left quadriceps muscle, fascia and tendon, subsequent encounter: Secondary | ICD-10-CM | POA: Diagnosis not present

## 2023-03-21 DIAGNOSIS — Z299 Encounter for prophylactic measures, unspecified: Secondary | ICD-10-CM | POA: Diagnosis not present

## 2023-03-23 DIAGNOSIS — M1711 Unilateral primary osteoarthritis, right knee: Secondary | ICD-10-CM | POA: Diagnosis not present

## 2023-03-24 DIAGNOSIS — Z4789 Encounter for other orthopedic aftercare: Secondary | ICD-10-CM | POA: Diagnosis not present

## 2023-03-24 DIAGNOSIS — S76102D Unspecified injury of left quadriceps muscle, fascia and tendon, subsequent encounter: Secondary | ICD-10-CM | POA: Diagnosis not present

## 2023-04-04 DIAGNOSIS — C44209 Unspecified malignant neoplasm of skin of left ear and external auricular canal: Secondary | ICD-10-CM | POA: Diagnosis not present

## 2023-04-04 DIAGNOSIS — C44229 Squamous cell carcinoma of skin of left ear and external auricular canal: Secondary | ICD-10-CM | POA: Diagnosis not present

## 2023-04-12 ENCOUNTER — Other Ambulatory Visit (HOSPITAL_COMMUNITY): Payer: Self-pay | Admitting: Otolaryngology

## 2023-04-12 DIAGNOSIS — C44209 Unspecified malignant neoplasm of skin of left ear and external auricular canal: Secondary | ICD-10-CM

## 2023-04-17 DIAGNOSIS — C4492 Squamous cell carcinoma of skin, unspecified: Secondary | ICD-10-CM | POA: Diagnosis not present

## 2023-04-17 DIAGNOSIS — I10 Essential (primary) hypertension: Secondary | ICD-10-CM | POA: Diagnosis not present

## 2023-04-17 DIAGNOSIS — N1832 Chronic kidney disease, stage 3b: Secondary | ICD-10-CM | POA: Diagnosis not present

## 2023-04-17 DIAGNOSIS — D692 Other nonthrombocytopenic purpura: Secondary | ICD-10-CM | POA: Diagnosis not present

## 2023-04-17 DIAGNOSIS — I739 Peripheral vascular disease, unspecified: Secondary | ICD-10-CM | POA: Diagnosis not present

## 2023-04-17 DIAGNOSIS — Z299 Encounter for prophylactic measures, unspecified: Secondary | ICD-10-CM | POA: Diagnosis not present

## 2023-04-20 ENCOUNTER — Ambulatory Visit (HOSPITAL_COMMUNITY)
Admission: RE | Admit: 2023-04-20 | Discharge: 2023-04-20 | Disposition: A | Discharge status: Home / Self Care | Payer: PPO | Source: Ambulatory Visit | Attending: Otolaryngology | Admitting: Otolaryngology

## 2023-04-20 DIAGNOSIS — C44209 Unspecified malignant neoplasm of skin of left ear and external auricular canal: Secondary | ICD-10-CM | POA: Insufficient documentation

## 2023-04-20 DIAGNOSIS — I6523 Occlusion and stenosis of bilateral carotid arteries: Secondary | ICD-10-CM | POA: Diagnosis not present

## 2023-04-20 DIAGNOSIS — M47812 Spondylosis without myelopathy or radiculopathy, cervical region: Secondary | ICD-10-CM | POA: Diagnosis not present

## 2023-04-20 DIAGNOSIS — R609 Edema, unspecified: Secondary | ICD-10-CM | POA: Diagnosis not present

## 2023-04-20 MED ORDER — IOHEXOL 350 MG/ML SOLN
75.0000 mL | Freq: Once | INTRAVENOUS | Status: AC | PRN
  Administered 2023-04-20: 75 mL via INTRAVENOUS

## 2023-04-25 ENCOUNTER — Ambulatory Visit: Payer: PPO | Admitting: Cardiology

## 2023-04-26 ENCOUNTER — Other Ambulatory Visit: Payer: Self-pay | Admitting: Otolaryngology

## 2023-04-28 ENCOUNTER — Other Ambulatory Visit: Payer: Self-pay | Admitting: Otolaryngology

## 2023-05-01 ENCOUNTER — Encounter (HOSPITAL_COMMUNITY): Payer: Self-pay

## 2023-05-01 ENCOUNTER — Ambulatory Visit (HOSPITAL_COMMUNITY): Payer: PPO

## 2023-05-02 NOTE — Progress Notes (Signed)
Surgical Instructions   Your procedure is scheduled on Thursday May 04, 2023. Report to [facility] Main Entrance "A" at 12:30 P.M.  then check in with the Admitting office. Any questions or running late day of surgery: call [phone]  Questions prior to your surgery date: call [phone], Monday-Friday, 8am-4pm. If you experience any cold or flu symptoms such as cough, fever, chills, shortness of breath, etc. between now and your scheduled surgery, please notify us at the above number.     Remember:  Do not eat after midnight the night before your surgery   You may drink clear liquids until 11:30 the morning of your surgery.   Clear liquids allowed are: Water, Non-Citrus Juices (without pulp), Carbonated Beverages, Clear Tea (no milk, honey, etc.), Black Coffee Only (NO MILK, CREAM OR POWDERED CREAMER of any kind), and Gatorade.    Take these medicines the morning of surgery with A SIP OF WATER  atorvastatin (LIPITOR)  gabapentin (NEURONTIN)  levothyroxine (SYNTHROID)  metoprolol tartrate (LOPRESSOR)   May take these medicines IF NEEDED: acetaminophen (TYLENOL)  nitroGLYCERIN (NITROSTAT).  If you have to take this medication prior to surgery, please call [phone] and report this to a nurse    Follow your surgeon's instructions on when to stop Asprin.  If no instructions were given by your surgeon then you will need to call the office to get those instructions.     One week prior to surgery, STOP taking any Aleve, Naproxen, Ibuprofen, Motrin, Advil, Goody's, BC's, all herbal medications, fish oil, and non-prescription vitamins.                     Do NOT Smoke (Tobacco/Vaping) for 24 hours prior to your procedure.  If you use a CPAP at night, you may bring your mask/headgear for your overnight stay.   You will be asked to remove any contacts, glasses, piercing's, hearing aid's, dentures/partials prior to surgery. Please bring cases for these items if needed.     Patients discharged the day of surgery will not be allowed to drive home, and someone needs to stay with them for 24 hours.  SURGICAL WAITING ROOM VISITATION Patients may have no more than 2 support people in the waiting area - these visitors may rotate.   Pre-op nurse will coordinate an appropriate time for 1 ADULT support person, who may not rotate, to accompany patient in pre-op.  Children under the age of 47 must have an adult with them who is not the patient and must remain in the main waiting area with an adult.  If the patient needs to stay at the hospital during part of their recovery, the visitor guidelines for inpatient rooms apply.  Please refer to the [facility] website for the visitor guidelines for any additional information.   If you received a COVID test during your pre-op visit  it is requested that you wear a mask when out in public, stay away from anyone that may not be feeling well and notify your surgeon if you develop symptoms. If you have been in contact with anyone that has tested positive in the last 10 days please notify you surgeon.      Pre-operative CHG Bathing Instructions   You can play a key role in reducing the risk of infection after surgery. Your skin needs to be as free of germs as possible. You can reduce the number of germs on your skin by washing with CHG (chlorhexidine gluconate) soap before surgery. CHG  is an antiseptic soap that kills germs and continues to kill germs even after washing.   DO NOT use if you have an allergy to chlorhexidine/CHG or antibacterial soaps. If your skin becomes reddened or irritated, stop using the CHG and notify one of our RNs at [phone].              TAKE A SHOWER THE NIGHT BEFORE SURGERY AND THE DAY OF SURGERY    Please keep in mind the following:  DO NOT shave, including legs and underarms, 48 hours prior to surgery.   You may shave your face before/day of surgery.  Place clean sheets on your bed the night  before surgery Use a clean washcloth (not used since being washed) for each shower. DO NOT sleep with pet's night before surgery.  CHG Shower Instructions:  Wash your face and private area with normal soap. If you choose to wash your hair, wash first with your normal shampoo.  After you use shampoo/soap, rinse your hair and body thoroughly to remove shampoo/soap residue.  Turn the water OFF and apply half the bottle of CHG soap to a CLEAN washcloth.  Apply CHG soap ONLY FROM YOUR NECK DOWN TO YOUR TOES (washing for 3-5 minutes)  DO NOT use CHG soap on face, private areas, open wounds, or sores.  Pay special attention to the area where your surgery is being performed.  If you are having back surgery, having someone wash your back for you may be helpful. Wait 2 minutes after CHG soap is applied, then you may rinse off the CHG soap.  Pat dry with a clean towel  Put on clean pajamas    Additional instructions for the day of surgery: DO NOT APPLY any lotions, deodorants or cologne.   Do not wear jewelry Do not bring valuables to the hospital. [facility] is not responsible for valuables/personal belongings. Put on clean/comfortable clothes.  Please brush your teeth.  Ask your nurse before applying any prescription medications to the skin.

## 2023-05-03 ENCOUNTER — Encounter (HOSPITAL_COMMUNITY)
Admission: RE | Admit: 2023-05-03 | Discharge: 2023-05-03 | Disposition: A | Discharge status: Home / Self Care | Payer: PPO | Source: Ambulatory Visit | Attending: Otolaryngology | Admitting: Otolaryngology

## 2023-05-03 ENCOUNTER — Other Ambulatory Visit: Payer: Self-pay

## 2023-05-03 ENCOUNTER — Encounter (HOSPITAL_COMMUNITY): Payer: Self-pay

## 2023-05-03 DIAGNOSIS — I129 Hypertensive chronic kidney disease with stage 1 through stage 4 chronic kidney disease, or unspecified chronic kidney disease: Secondary | ICD-10-CM | POA: Insufficient documentation

## 2023-05-03 DIAGNOSIS — Z01812 Encounter for preprocedural laboratory examination: Secondary | ICD-10-CM | POA: Insufficient documentation

## 2023-05-03 DIAGNOSIS — C44209 Unspecified malignant neoplasm of skin of left ear and external auricular canal: Secondary | ICD-10-CM | POA: Insufficient documentation

## 2023-05-03 DIAGNOSIS — I2510 Atherosclerotic heart disease of native coronary artery without angina pectoris: Secondary | ICD-10-CM | POA: Insufficient documentation

## 2023-05-03 DIAGNOSIS — Z01818 Encounter for other preprocedural examination: Secondary | ICD-10-CM

## 2023-05-03 DIAGNOSIS — Z87891 Personal history of nicotine dependence: Secondary | ICD-10-CM | POA: Diagnosis not present

## 2023-05-03 DIAGNOSIS — N183 Chronic kidney disease, stage 3 unspecified: Secondary | ICD-10-CM | POA: Diagnosis not present

## 2023-05-03 DIAGNOSIS — I252 Old myocardial infarction: Secondary | ICD-10-CM | POA: Diagnosis not present

## 2023-05-03 HISTORY — DX: Polyneuropathy, unspecified: G62.9

## 2023-05-03 LAB — CBC
HCT: 35.4 % — ABNORMAL LOW (ref 39.0–52.0)
Hemoglobin: 11.3 g/dL — ABNORMAL LOW (ref 13.0–17.0)
MCH: 31.6 pg (ref 26.0–34.0)
MCHC: 31.9 g/dL (ref 30.0–36.0)
MCV: 98.9 fL (ref 80.0–100.0)
Platelets: 231 10*3/uL (ref 150–400)
RBC: 3.58 MIL/uL — ABNORMAL LOW (ref 4.22–5.81)
RDW: 13.2 % (ref 11.5–15.5)
WBC: 10.2 10*3/uL (ref 4.0–10.5)
nRBC: 0 % (ref 0.0–0.2)

## 2023-05-03 LAB — BASIC METABOLIC PANEL
Anion gap: 10 (ref 5–15)
BUN: 22 mg/dL (ref 8–23)
CO2: 20 mmol/L — ABNORMAL LOW (ref 22–32)
Calcium: 9.9 mg/dL (ref 8.9–10.3)
Chloride: 111 mmol/L (ref 98–111)
Creatinine, Ser: 1.88 mg/dL — ABNORMAL HIGH (ref 0.61–1.24)
GFR, Estimated: 37 mL/min — ABNORMAL LOW (ref >60)
Glucose, Bld: 92 mg/dL (ref 70–99)
Potassium: 3.9 mmol/L (ref 3.5–5.1)
Sodium: 141 mmol/L (ref 135–145)

## 2023-05-03 NOTE — Anesthesia Preprocedure Evaluation (Signed)
Anesthesia Evaluation  Patient identified by MRN, date of birth, ID band Patient awake    Reviewed: Allergy & Precautions, H&P , NPO status , Patient's Chart, lab work & pertinent test results  Airway Mallampati: II   Neck ROM: full    Dental   Pulmonary COPD, former smoker   breath sounds clear to auscultation       Cardiovascular hypertension, + dysrhythmias + pacemaker  Rhythm:regular Rate:Normal     Neuro/Psych  PSYCHIATRIC DISORDERS Anxiety Depression       GI/Hepatic ,GERD  ,,  Endo/Other    Renal/GU stones     Musculoskeletal  (+) Arthritis ,    Abdominal   Peds  Hematology  (+) Blood dyscrasia, anemia Waldenstrom's macroglobinemia   Anesthesia Other Findings   Reproductive/Obstetrics                             Anesthesia Physical Anesthesia Plan  ASA: 3  Anesthesia Plan: General   Post-op Pain Management:    Induction: Intravenous  PONV Risk Score and Plan: 2 and Ondansetron, Dexamethasone, Treatment may vary due to age or medical condition and Midazolam  Airway Management Planned: Oral ETT  Additional Equipment:   Intra-op Plan:   Post-operative Plan: Extubation in OR  Informed Consent: I have reviewed the patients History and Physical, chart, labs and discussed the procedure including the risks, benefits and alternatives for the proposed anesthesia with the patient or authorized representative who has indicated his/her understanding and acceptance.     Dental advisory given  Plan Discussed with: CRNA, Anesthesiologist and Surgeon  Anesthesia Plan Comments: (PAT note written 05/03/2023 by Shonna Chock, PA-C.  )       Anesthesia Quick Evaluation  Intravenous  PONV Risk Score and Plan: 2 and Ondansetron, Dexamethasone and Treatment may vary due to age or medical condition  Airway Management Planned: Oral ETT  Additional Equipment:   Intra-op Plan:   Post-operative Plan: Extubation in OR  Informed Consent: I have reviewed the patients History and Physical, chart, labs and discussed the procedure including the risks, benefits and alternatives for the proposed anesthesia with the patient or authorized representative who has indicated his/her understanding and acceptance.     Dental advisory given  Plan Discussed with: CRNA and Anesthesiologist  Anesthesia Plan Comments: (PAT note written 05/03/2023 by Shonna Chock, PA-C.  Risks of general anesthesia discussed including, but not limited to, sore throat, hoarse voice, chipped/damaged teeth, injury to vocal cords, nausea and vomiting, allergic reactions, lung infection, heart attack, stroke, and death. All questions answered.  )       Anesthesia Quick Evaluation

## 2023-05-03 NOTE — Progress Notes (Signed)
Anesthesia Chart Review:  Case: [id number] Date/Time: 05/04/23 1430   Procedure: LEFT AURICULECTOMY (Left)   Anesthesia type: General   Pre-op diagnosis: Cancer of left external ear   [facility] ROOM 08 / [facility] OR   Surgeons: Christia Reading, MD       DISCUSSION: Patient is a 72 year old male scheduled for the above procedure.  History includes former smoker, HTN, CAD (NSTEMI, s/p DES RCA, PTCA right marginal branch 08/14/14), CKD (Stage III), left ear cancer. S/p left open repair quadriceps tendon on 10/10/22.   Last cardiology evaluation was on 09/08/22 with Dr. Diona Browner. Denied any significant chest discomfort and no nitroglycerin use. No changes made. He wrote, "He is doing well without active angina. Continue aspirin, Lopressor, Lipitor, and as needed nitroglycerin. He continues to prefer observation over follow-up ischemic surveillance testing in the absence of symptoms." LVEF 55% at time of 2016 MI.    Reportedly he is just completely a course of Ceftin due to ear drainage.  Creatinine 1.88, but consistent with prior results.   Anesthesia team to evaluate on the day of surgery.   VS: BP 135/64   Pulse 62   Temp 36.8 C (Oral)   Resp 18   Ht 6' (1.829 m)   Wt 84.6 kg   SpO2 100%   BMI 25.31 kg/m    PROVIDERS: [name], MD is PCP  Nona Dell, MD is Cardiologist    LABS: Preoperative labs noted.  Renal labs appears consistent with prior results. (all labs ordered are listed, but only abnormal results are displayed)  Labs Reviewed  BASIC METABOLIC PANEL - Abnormal; Notable for the following components:      Result Value   CO2 20 (*)    Creatinine, Ser 1.88 (*)    GFR, Estimated 37 (*)    All other components within normal limits  CBC - Abnormal; Notable for the following components:   RBC 3.58 (*)    Hemoglobin 11.3 (*)    HCT 35.4 (*)    All other components within normal limits    IMAGES: CT Soft Tissue Neck 04/20/23: Report in process.   EKG:  09/08/22: Sinus Rhythm  -Nonspecific ST depression  +  Nonspecific T-abnormality -Nondiagnostic. ABNORMAL   CV: Cardiac cath/PCI 08/14/14: - Left main: Angiographically normal vessel which bifurcated into a large LAD and a large dominant left circumflex coronary artery. - LAD: LAD gave rise to 2 proximal diagonal vessels.  Immediately after the takeoff of this second diagonal vessel.  The LAD had 50-60% stenosis proximal to a septal perforating artery.  The remainder of the LAD was free of significant disease and extended to the LV apex. - Left circumflex: Circumflex was large, dominant vessel that had a diffuse 80% stenosis in a small OM1 branch.  The vessel supplied several additional marginal branches and ended in a posterolateral large caliber vessel.  - Right coronary artery: Nondominant vessel that had total proximal occlusion with TIMI 0 flow initially. - Left ventriculography revealed preserved LV contractility.  Ejection fraction is 55%.  There was no evidence for mitral regurgitation.     IMPRESSION: - Non ST segment elevation myocardial infarction secondary to proximal RCA occlusion in a nondominant RCA. - Multivessel CAD with 50-60% LAD stenosis immediately after the takeoff of a proximal second diagonal branch; 80% stenosis diffusely in a small caliber OM1 branch of a dominant left circumflex artery artery; total proximal RCA occlusion. - Preserved LV function with an ejection fraction of  55%. - Successful PCI of the RCA with PTCA and DES stenting with a 2.2524 mm Synergy in the proximal RCA extending into the marginal branch with the 100% occlusion.  in the proximal RCA and immediately after the bifurcation, both being reduced to 0% and PTCA of the very distal 90% marginal branch stenosis to less than 40%.     Past Medical History:  Diagnosis Date   Aneurysm (HCC)    in head per pt that sealed itself in 2007   Arthritis    CAD (coronary artery disease), native coronary artery     DES RCA and PTCA RVM 07/2014, LVEF 55%   Cellulitis and abscess 08/08/2015   CKD (chronic kidney disease) stage 3, GFR 30-59 ml/min (HCC)    Essential hypertension    History of kidney stones    Hyperkalemia 07/06/2019   Hyperlipidemia    Hypothyroidism    Neuropathy    STEMI (ST elevation myocardial infarction) (HCC)    07/2014    Past Surgical History:  Procedure Laterality Date   CHOLECYSTECTOMY     COLONOSCOPY N/A 02/09/2021   Procedure: COLONOSCOPY;  Surgeon: Franky Macho, MD;  Location: [facility];  Service: Gastroenterology;  Laterality: N/A;   COLONOSCOPY WITH PROPOFOL N/A 11/08/2013   Procedure: COLONOSCOPY WITH PROPOFOL;  Surgeon: Dalia Heading, MD;  Location: [facility] ORS;  Service: General;  Laterality: N/A;  in cecum at 0741; cecal withdrawal time = 10 min   coronary stents      CYSTOSCOPY W/ RETROGRADES Bilateral 10/14/2021   Procedure: CYSTOSCOPY WITH RETROGRADE PYELOGRAM;  Surgeon: Milderd Meager., MD;  Location: [facility]S;  Service: Urology;  Laterality: Bilateral;   CYSTOSCOPY WITH STENT PLACEMENT Bilateral 10/14/2021   Procedure: CYSTOSCOPY WITH STENT PLACEMENT;  Surgeon: Milderd Meager., MD;  Location: [facility];  Service: Urology;  Laterality: Bilateral;   EXTRACORPOREAL SHOCK WAVE LITHOTRIPSY     EXTRACORPOREAL SHOCK WAVE LITHOTRIPSY Right 09/22/2020   Procedure: EXTRACORPOREAL SHOCK WAVE LITHOTRIPSY (ESWL);  Surgeon: Malen Gauze, MD;  Location: [facility];  Service: Urology;  Laterality: Right;  cases not starting until 10:00 due to last minute add ons, need time to get covid tests back   EXTRACORPOREAL SHOCK WAVE LITHOTRIPSY Left 10/26/2021   Procedure: EXTRACORPOREAL SHOCK WAVE LITHOTRIPSY (ESWL);  Surgeon: Milderd Meager., MD;  Location: [facility];  Service: Urology;  Laterality: Left;   INCISION AND DRAINAGE ABSCESS Right 08/12/2015   Procedure: INCISION AND DRAINAGE ABSCESS;  Surgeon: Franky Macho, MD;  Location: [facility];  Service: General;   Laterality: Right;   KNEE ARTHROSCOPY Right    LEFT HEART CATHETERIZATION WITH CORONARY ANGIOGRAM N/A 08/14/2014   Procedure: LEFT HEART CATHETERIZATION WITH CORONARY ANGIOGRAM;  Surgeon: Lennette Bihari, MD;  LAD 50-60%, CFX OK, OM1 80%(small), RCA 100>>0% w/ 2.25x24 mm Synergy DES, EF 55%   POLYPECTOMY N/A 11/08/2013   Procedure: POLYPECTOMY;  Surgeon: Dalia Heading, MD;  Location: [facility];  Service: General;  Laterality: N/A;  cecal polyp   POLYPECTOMY  02/09/2021   Procedure: POLYPECTOMY INTESTINAL;  Surgeon: Franky Macho, MD;  [facility];  Service: Gastroenterology;;   QUADRICEPS TENDON REPAIR Left 10/10/2022   Procedure: OPEN REPAIR QUADRICEP TENDON;  Surgeon: Yolonda Kida, MD;  Location[facility];  Service: Orthopedics;  Laterality: Left;  90   Removal of kidney stones     Open   URETEROSCOPY WITH HOLMIUM LASER LITHOTRIPSY Right 10/14/2021   Procedure: URETEROSCOPY WITH HOLMIUM LASER LITHOTRIPSY;  Surgeon: Pete Glatter,  Danford Bad., MD;  Location: [facility];  Service: Urology;  Laterality: Right;   WOUND DEBRIDEMENT Right 10/26/2015   Procedure: DEBRIDEMENT WOUND RIGHT FLANK;  Surgeon: Franky Macho, MD;  Location: [facility];  Service: General;  Laterality: Right;    MEDICATIONS:  acetaminophen (TYLENOL) 500 MG tablet   docusate (COLACE) 50 MG/5ML liquid   aspirin 81 MG EC tablet   atorvastatin (LIPITOR) 40 MG tablet   cefUROXime (CEFTIN) 500 MG tablet   cholecalciferol (VITAMIN D3) 25 MCG (1000 UNIT) tablet   gabapentin (NEURONTIN) 100 MG capsule   levothyroxine (SYNTHROID) 50 MCG tablet   metoprolol tartrate (LOPRESSOR) 25 MG tablet   nitroGLYCERIN (NITROSTAT) 0.4 MG SL tablet   traMADol (ULTRAM) 50 MG tablet   vitamin B-12 (CYANOCOBALAMIN) 1000 MCG tablet   zinc gluconate 50 MG tablet   No current facility-administered medications for this encounter.    Shonna Chock, PA-C Surgical Short Stay/Anesthesiology [facility] Phone [phone] [facility] Phone [phone] 05/03/2023 3:36 PM

## 2023-05-03 NOTE — Progress Notes (Signed)
PCP - Dr. Mcarthur Rossetti  Cardiologist -    Dr. Nona Dell  EP-no  Endocrine-no  Pulm-no  Chest x-ray -   EKG - 09/08/22  Stress Test - no  ECHO - no  Cardiac Cath - no  AICD-no PM-no LOOP-no  Nerve Stimulator-no  Dialysis-no  Sleep Study - no CPAP - no  LABS-CBC, BMP  ASA- has stopped  ERAS-yes  HA1C-na GLP-1-no Fasting Blood Sugar - na Checks Blood Sugar _na  times a day  Anesthesia-  Pt denies having chest pain, sob, or fever at this time. All instructions explained to the pt, with a verbal understanding of the material. Pt agrees to go over the instructions while at home for a better understanding. The opportunity to ask questions was provided.

## 2023-05-04 ENCOUNTER — Other Ambulatory Visit: Payer: Self-pay

## 2023-05-04 ENCOUNTER — Encounter (HOSPITAL_COMMUNITY): Payer: Self-pay | Admitting: Otolaryngology

## 2023-05-04 ENCOUNTER — Ambulatory Visit (HOSPITAL_COMMUNITY): Payer: Self-pay | Admitting: Vascular Surgery

## 2023-05-04 ENCOUNTER — Ambulatory Visit (HOSPITAL_COMMUNITY)
Admission: RE | Admit: 2023-05-04 | Discharge: 2023-05-04 | Disposition: A | Discharge status: Home / Self Care | Payer: PPO | Attending: Otolaryngology | Admitting: Otolaryngology

## 2023-05-04 ENCOUNTER — Ambulatory Visit (HOSPITAL_BASED_OUTPATIENT_CLINIC_OR_DEPARTMENT_OTHER): Payer: PPO | Admitting: Anesthesiology

## 2023-05-04 ENCOUNTER — Encounter (HOSPITAL_COMMUNITY)
Admission: RE | Disposition: A | Discharge status: Home / Self Care | Payer: Self-pay | Source: Home / Self Care | Attending: Otolaryngology

## 2023-05-04 DIAGNOSIS — I129 Hypertensive chronic kidney disease with stage 1 through stage 4 chronic kidney disease, or unspecified chronic kidney disease: Secondary | ICD-10-CM | POA: Insufficient documentation

## 2023-05-04 DIAGNOSIS — I252 Old myocardial infarction: Secondary | ICD-10-CM | POA: Diagnosis not present

## 2023-05-04 DIAGNOSIS — G629 Polyneuropathy, unspecified: Secondary | ICD-10-CM | POA: Insufficient documentation

## 2023-05-04 DIAGNOSIS — E039 Hypothyroidism, unspecified: Secondary | ICD-10-CM | POA: Diagnosis not present

## 2023-05-04 DIAGNOSIS — Z955 Presence of coronary angioplasty implant and graft: Secondary | ICD-10-CM | POA: Insufficient documentation

## 2023-05-04 DIAGNOSIS — C44209 Unspecified malignant neoplasm of skin of left ear and external auricular canal: Secondary | ICD-10-CM | POA: Diagnosis not present

## 2023-05-04 DIAGNOSIS — N1831 Chronic kidney disease, stage 3a: Secondary | ICD-10-CM | POA: Diagnosis not present

## 2023-05-04 DIAGNOSIS — I2510 Atherosclerotic heart disease of native coronary artery without angina pectoris: Secondary | ICD-10-CM | POA: Insufficient documentation

## 2023-05-04 DIAGNOSIS — Z87891 Personal history of nicotine dependence: Secondary | ICD-10-CM | POA: Insufficient documentation

## 2023-05-04 DIAGNOSIS — N183 Chronic kidney disease, stage 3 unspecified: Secondary | ICD-10-CM | POA: Diagnosis not present

## 2023-05-04 DIAGNOSIS — C44229 Squamous cell carcinoma of skin of left ear and external auricular canal: Secondary | ICD-10-CM | POA: Insufficient documentation

## 2023-05-04 DIAGNOSIS — Z01818 Encounter for other preprocedural examination: Secondary | ICD-10-CM

## 2023-05-04 HISTORY — PX: EAR CYST EXCISION: SHX22

## 2023-05-04 SURGERY — EXCISION, CYST, EAR
Anesthesia: General | Site: Ear | Laterality: Left

## 2023-05-04 MED ORDER — TRAMADOL HCL 50 MG PO TABS: 50.0000 mg | ORAL_TABLET | Freq: Four times a day (QID) | ORAL | 0 refills | Status: DC | PRN

## 2023-05-04 MED ORDER — PHENYLEPHRINE 80 MCG/ML (10ML) SYRINGE FOR IV PUSH (FOR BLOOD PRESSURE SUPPORT)
PREFILLED_SYRINGE | INTRAVENOUS | Status: DC | PRN
  Administered 2023-05-04: 150 ug via INTRAVENOUS

## 2023-05-04 MED ORDER — ONDANSETRON HCL 4 MG/2ML IJ SOLN
INTRAMUSCULAR | Status: DC | PRN
  Administered 2023-05-04: 4 mg via INTRAVENOUS

## 2023-05-04 MED ORDER — ORAL CARE MOUTH RINSE: 15.0000 mL | Freq: Once | OROMUCOSAL | Status: AC

## 2023-05-04 MED ORDER — FENTANYL CITRATE (PF) 250 MCG/5ML IJ SOLN
INTRAMUSCULAR | Status: AC
  Filled 2023-05-04: qty 5

## 2023-05-04 MED ORDER — AMISULPRIDE (ANTIEMETIC) 5 MG/2ML IV SOLN: 10.0000 mg | Freq: Once | INTRAVENOUS | Status: DC | PRN

## 2023-05-04 MED ORDER — CHLORHEXIDINE GLUCONATE 0.12 % MT SOLN
15.0000 mL | Freq: Once | OROMUCOSAL | Status: AC
  Administered 2023-05-04: 15 mL via OROMUCOSAL
  Filled 2023-05-04: qty 15

## 2023-05-04 MED ORDER — OXYCODONE HCL 5 MG PO TABS: 5.0000 mg | ORAL_TABLET | Freq: Once | ORAL | Status: DC | PRN

## 2023-05-04 MED ORDER — CEFAZOLIN SODIUM-DEXTROSE 2-4 GM/100ML-% IV SOLN
2.0000 g | INTRAVENOUS | Status: DC
  Filled 2023-05-04: qty 100

## 2023-05-04 MED ORDER — ACETAMINOPHEN 10 MG/ML IV SOLN: 1000.0000 mg | Freq: Once | INTRAVENOUS | Status: DC | PRN

## 2023-05-04 MED ORDER — OXYCODONE HCL 5 MG/5ML PO SOLN
5.0000 mg | Freq: Once | ORAL | Status: DC | PRN
Start: 2023-05-04 — End: 2023-05-04

## 2023-05-04 MED ORDER — PROPOFOL 10 MG/ML IV BOLUS
INTRAVENOUS | Status: DC | PRN
  Administered 2023-05-04: 100 mg via INTRAVENOUS

## 2023-05-04 MED ORDER — BACITRACIN ZINC 500 UNIT/GM EX OINT
TOPICAL_OINTMENT | CUTANEOUS | Status: DC | PRN
  Administered 2023-05-04: 1 via TOPICAL

## 2023-05-04 MED ORDER — LIDOCAINE-EPINEPHRINE 1 %-1:100000 IJ SOLN
INTRAMUSCULAR | Status: AC
  Filled 2023-05-04: qty 1

## 2023-05-04 MED ORDER — 0.9 % SODIUM CHLORIDE (POUR BTL) OPTIME
TOPICAL | Status: DC | PRN
  Administered 2023-05-04: 1000 mL

## 2023-05-04 MED ORDER — LACTATED RINGERS IV SOLN: INTRAVENOUS | Status: DC | PRN

## 2023-05-04 MED ORDER — ROCURONIUM BROMIDE 10 MG/ML (PF) SYRINGE
PREFILLED_SYRINGE | INTRAVENOUS | Status: DC | PRN
  Administered 2023-05-04: 50 mg via INTRAVENOUS

## 2023-05-04 MED ORDER — EPHEDRINE SULFATE-NACL 50-0.9 MG/10ML-% IV SOSY
PREFILLED_SYRINGE | INTRAVENOUS | Status: DC | PRN
  Administered 2023-05-04: 10 mg via INTRAVENOUS

## 2023-05-04 MED ORDER — FENTANYL CITRATE (PF) 100 MCG/2ML IJ SOLN: 25.0000 ug | INTRAMUSCULAR | Status: DC | PRN

## 2023-05-04 MED ORDER — FENTANYL CITRATE (PF) 250 MCG/5ML IJ SOLN
INTRAMUSCULAR | Status: DC | PRN
  Administered 2023-05-04: 50 ug via INTRAVENOUS
  Administered 2023-05-04: 100 ug via INTRAVENOUS
  Administered 2023-05-04 (×2): 50 ug via INTRAVENOUS

## 2023-05-04 MED ORDER — SUGAMMADEX SODIUM 200 MG/2ML IV SOLN
INTRAVENOUS | Status: DC | PRN
  Administered 2023-05-04: 200 mg via INTRAVENOUS

## 2023-05-04 MED ORDER — LIDOCAINE-EPINEPHRINE 1 %-1:100000 IJ SOLN
INTRAMUSCULAR | Status: DC | PRN
  Administered 2023-05-04: 6 mL

## 2023-05-04 SURGICAL SUPPLY — 54 items
ATTRACTOMAT 16X20 MAGNETIC DRP (DRAPES) IMPLANT
BAG COUNTER SPONGE SURGICOUNT (BAG) ×2 IMPLANT
CLEANER TIP ELECTROSURG 2X2 (MISCELLANEOUS) ×2 IMPLANT
CNTNR URN SCR LID CUP LEK RST (MISCELLANEOUS) ×2 IMPLANT
CONT SPEC 4OZ STRL OR WHT (MISCELLANEOUS) ×1
CORD BIPOLAR FORCEPS 12FT (ELECTRODE) IMPLANT
COVER SURGICAL LIGHT HANDLE (MISCELLANEOUS) ×2 IMPLANT
DRAPE HALF SHEET 40X57 (DRAPES) IMPLANT
DRAPE SURG 17X23 STRL (DRAPES) IMPLANT
DRSG GLASSCOCK MASTOID ADT (GAUZE/BANDAGES/DRESSINGS) IMPLANT
DRSG TELFA 3X8 NADH STRL (GAUZE/BANDAGES/DRESSINGS) IMPLANT
ELECT COATED BLADE 2.86 ST (ELECTRODE) ×2 IMPLANT
ELECT REM PT RETURN 9FT ADLT (ELECTROSURGICAL) ×1
ELECTRODE REM PT RTRN 9FT ADLT (ELECTROSURGICAL) ×2 IMPLANT
FORCEPS BIPOLAR SPETZLER 8 1.0 (NEUROSURGERY SUPPLIES) IMPLANT
GAUZE 4X4 16PLY ~~LOC~~+RFID DBL (SPONGE) IMPLANT
GAUZE SPONGE 4X4 12PLY STRL (GAUZE/BANDAGES/DRESSINGS) IMPLANT
GLOVE ECLIPSE 7.5 STRL STRAW (GLOVE) ×2 IMPLANT
GOWN STRL REUS W/ TWL LRG LVL3 (GOWN DISPOSABLE) ×2 IMPLANT
GOWN STRL REUS W/TWL LRG LVL3 (GOWN DISPOSABLE) ×1
HOLDER TRACH TUBE VELCRO 19.5 (MISCELLANEOUS) IMPLANT
KIT BASIN OR (CUSTOM PROCEDURE TRAY) ×2 IMPLANT
KIT TURNOVER KIT B (KITS) ×2 IMPLANT
LOCATOR NERVE 3 VOLT (DISPOSABLE) IMPLANT
NDL HYPO 25GX1X1/2 BEV (NEEDLE) IMPLANT
NEEDLE HYPO 25GX1X1/2 BEV (NEEDLE) ×1
NS IRRIG 1000ML POUR BTL (IV SOLUTION) ×2 IMPLANT
PAD ARMBOARD 7.5X6 YLW CONV (MISCELLANEOUS) ×4 IMPLANT
PENCIL SMOKE EVACUATOR (MISCELLANEOUS) ×2 IMPLANT
POSITIONER HEAD DONUT 9IN (MISCELLANEOUS) IMPLANT
SPECIMEN JAR MEDIUM (MISCELLANEOUS) ×2 IMPLANT
SPONGE INTESTINAL PEANUT (DISPOSABLE) IMPLANT
SPONGE T-LAP 18X18 ~~LOC~~+RFID (SPONGE) ×2 IMPLANT
STAPLER VISISTAT 35W (STAPLE) ×2 IMPLANT
SUT CHROMIC 3 0 PS 2 (SUTURE) IMPLANT
SUT ETHILON 4 0 PS 2 18 (SUTURE) IMPLANT
SUT ETHILON 5 0 PS 2 18 (SUTURE) IMPLANT
SUT SILK 2 0 (SUTURE)
SUT SILK 2-0 18XBRD TIE 12 (SUTURE) IMPLANT
SUT SILK 3 0 (SUTURE)
SUT SILK 3 0 SH CR/8 (SUTURE) ×2 IMPLANT
SUT SILK 3-0 18XBRD TIE 12 (SUTURE) ×2 IMPLANT
SUT SILK 4 0 (SUTURE)
SUT SILK 4-0 18XBRD TIE 12 (SUTURE) ×2 IMPLANT
SUT VIC AB 3-0 SH 27 (SUTURE) ×2
SUT VIC AB 3-0 SH 27X BRD (SUTURE) IMPLANT
SUT VIC AB 4-0 PS2 18 (SUTURE) IMPLANT
TOWEL GREEN STERILE FF (TOWEL DISPOSABLE) ×2 IMPLANT
TRAY ENT MC OR (CUSTOM PROCEDURE TRAY) ×2 IMPLANT
TUBE TRACH 6.0 CUFF FLEX (MISCELLANEOUS) IMPLANT
TUBE TRACH FLEX 8.0 CUFF (MISCELLANEOUS)
TUBE TRACH FLEX 8.5 CUFF (MISCELLANEOUS) IMPLANT
UNDERPAD 30X36 HEAVY ABSORB (UNDERPADS AND DIAPERS) IMPLANT
WATER STERILE IRR 1000ML POUR (IV SOLUTION) ×2 IMPLANT

## 2023-05-04 NOTE — Op Note (Signed)
Preop diagnosis: Left external ear cancer Postop diagnosis: same Procedure: Left auriculectomy Surgeon: Jenne Pane Assist: None Anesth: General and local with 1% lidocaine with 1:100,000 epinephrine Compl: None Findings: Two ulcerative masses involving most of the left external ear.  Resection with margins resulted in removal of most of the external ear with the exception of the lobe.  Frozen section margins were all negative for cancer. Description:  After discussing risks, benefits, and alternatives, the patient was brought to the operative suite and placed on the operative table in the supine position.  Anesthesia was induced and the patient was intubated by the anesthesia team without difficulty.  The patient was given intravenous antibiotics.  The bed was turned 90 degrees from Anesthesia.  The left ear was marked with a marking pen with a 1 cm margin around the tumors and was then injected with local anesthetic.  The left ear and face was prepped and draped in sterile fashion.  The incision was made around the tumors using a 15 blade scalpel and then extended through the subcutaneous tissue and cartilage using electrocautery.  The ear was removed and passed to nursing for pathology.  Margins around the periphery of the skin defect were then collected using a 15 blade and sent for frozen section.  All returned negative for carcinoma.  The skin around the defect was then undermined, particularly posteriorly, allowing it to be moved anteriorly for closure.  Some of the remaining cartilage in the root of the helix and conchal area was removed to allow skin elevation for closure.  The subcutaneous layer was closed with 3-0 Vicryl.  The earlobe was laid superiorly and used to help close the lower defect.  A dog ear was removed posterior to the earlobe.  Some additional subcutaneous  closure was performed with 4-0 Vicryl.  The skin was then closed with 4-0 Nylon in a simple, interrupted fashion and with vertical  mattress sutures at the ear canal opening.  When completed, the patient was cleaned off and a Bacitracin ointment was added.  Drapes were removed and a Glasscock dressing was applied.  The bed was turned back to Anesthesia for wake-up and he was extubated and moved to the recovery room in stable condition.

## 2023-05-04 NOTE — H&P (Signed)
Charles Brooks is an 72 y.o. male.   Chief Complaint: Ear cancer HPI: 72 year old male with worsening left ear ulcerative mass.  Biopsy was positive for squamous cell carcinoma.  Neck CT was negative.  Past Medical History:  Diagnosis Date   Aneurysm (HCC)    in head per pt that sealed itself in 2007   Arthritis    CAD (coronary artery disease), native coronary artery    DES RCA and PTCA RVM 07/2014, LVEF 55%   Cellulitis and abscess 08/08/2015   CKD (chronic kidney disease) stage 3, GFR 30-59 ml/min (HCC)    Essential hypertension    History of kidney stones    Hyperkalemia 07/06/2019   Hyperlipidemia    Hypothyroidism    Neuropathy    STEMI (ST elevation myocardial infarction) (HCC)    07/2014    Past Surgical History:  Procedure Laterality Date   CHOLECYSTECTOMY     COLONOSCOPY N/A 02/09/2021   Procedure: COLONOSCOPY;  Surgeon: Franky Macho, MD;  [facility];  Service: Gastroenterology;  Laterality: N/A;   COLONOSCOPY WITH PROPOFOL N/A 11/08/2013   Procedure: COLONOSCOPY WITH PROPOFOL;  Surgeon: Dalia Heading, MD;  Location: [facility];  Service: General;  Laterality: N/A;  in cecum at 0741; cecal withdrawal time = 10 min   coronary stents      CYSTOSCOPY W/ RETROGRADES Bilateral 10/14/2021   Procedure: CYSTOSCOPY WITH RETROGRADE PYELOGRAM;  Surgeon: Milderd Meager., MD;  Location: [facility];  Service: Urology;  Laterality: Bilateral;   CYSTOSCOPY WITH STENT PLACEMENT Bilateral 10/14/2021   Procedure: CYSTOSCOPY WITH STENT PLACEMENT;  Surgeon: Milderd Meager., MD;  Location: [facility];  Service: Urology;  Laterality: Bilateral;   EXTRACORPOREAL SHOCK WAVE LITHOTRIPSY     EXTRACORPOREAL SHOCK WAVE LITHOTRIPSY Right 09/22/2020   Procedure: EXTRACORPOREAL SHOCK WAVE LITHOTRIPSY (ESWL);  Surgeon: Malen Gauze, MD;  Location: [facility];  Service: Urology;  Laterality: Right;  cases not starting until 10:00 due to last minute add ons, need time to get covid tests  back   EXTRACORPOREAL SHOCK WAVE LITHOTRIPSY Left 10/26/2021   Procedure: EXTRACORPOREAL SHOCK WAVE LITHOTRIPSY (ESWL);  Surgeon: Milderd Meager., MD;  [facility]: [facility];  Service: Urology;  Laterality: Left;   INCISION AND DRAINAGE ABSCESS Right 08/12/2015   Procedure: INCISION AND DRAINAGE ABSCESS;  Surgeon: Franky Macho, MD;  [facility];  Service: General;  Laterality: Right;   KNEE ARTHROSCOPY Right    LEFT HEART CATHETERIZATION WITH CORONARY ANGIOGRAM N/A 08/14/2014   Procedure: LEFT HEART CATHETERIZATION WITH CORONARY ANGIOGRAM;  Surgeon: Lennette Bihari, MD;  LAD 50-60%, CFX OK, OM1 80%(small), RCA 100>>0% w/ 2.25x24 mm Synergy DES, EF 55%   POLYPECTOMY N/A 11/08/2013   Procedure: POLYPECTOMY;  Surgeon: Dalia Heading, MD;  [facility];  Service: General;  Laterality: N/A;  cecal polyp   POLYPECTOMY  02/09/2021   Procedure: POLYPECTOMY INTESTINAL;  Surgeon: Franky Macho, MD;  [facility];  Service: Gastroenterology;;   QUADRICEPS TENDON REPAIR Left 10/10/2022   Procedure: OPEN REPAIR QUADRICEP TENDON;  Surgeon: Yolonda Kida, MD;  Location: [facility] OR;  Service: Orthopedics;  Laterality: Left;  90   Removal of kidney stones     Open   URETEROSCOPY WITH HOLMIUM LASER LITHOTRIPSY Right 10/14/2021   Procedure: URETEROSCOPY WITH HOLMIUM LASER LITHOTRIPSY;  Surgeon: Milderd Meager., MD;  [facility];  Service: Urology;  Laterality: Right;   WOUND DEBRIDEMENT Right 10/26/2015   Procedure: DEBRIDEMENT WOUND RIGHT  FLANK;  Surgeon: Franky Macho, MD;  Location: [facility];  Service: General;  Laterality: Right;    Family History  Problem Relation Age of Onset   Heart attack Brother    Social History:  reports that he quit smoking about 19 years ago. His smoking use included cigars. He has never used smokeless tobacco. He reports that he does not drink alcohol and does not use drugs.  Allergies:  Allergies  Allergen Reactions   Codeine Hives     Medications Prior to Admission  Medication Sig Dispense Refill   acetaminophen (TYLENOL) 500 MG tablet Take 1,000 mg by mouth every 6 (six) hours as needed for moderate pain (pain score 4-6) or mild pain (pain score 1-3).     aspirin 81 MG EC tablet Take 1 tablet (81 mg total) by mouth daily with breakfast. 30 tablet 1   atorvastatin (LIPITOR) 40 MG tablet Take 1 tablet (40 mg total) by mouth daily. 10 tablet 0   cefUROXime (CEFTIN) 500 MG tablet Take 500 mg by mouth 2 (two) times daily.     cholecalciferol (VITAMIN D3) 25 MCG (1000 UNIT) tablet Take 1,000 Units by mouth daily.     docusate (COLACE) 50 MG/5ML liquid Take by mouth at bedtime.     gabapentin (NEURONTIN) 100 MG capsule Take 1 capsule (100 mg total) by mouth at bedtime. (Patient taking differently: Take 100 mg by mouth 2 (two) times daily.)     levothyroxine (SYNTHROID) 50 MCG tablet Take 50 mcg by mouth daily before breakfast.     metoprolol tartrate (LOPRESSOR) 25 MG tablet Take 25 mg by mouth 2 (two) times daily.     nitroGLYCERIN (NITROSTAT) 0.4 MG SL tablet Place 1 tablet (0.4 mg total) under the tongue every 5 (five) minutes x 3 doses as needed for chest pain. 25 tablet 12   traMADol (ULTRAM) 50 MG tablet Take 50 mg by mouth every 6 (six) hours as needed.     vitamin B-12 (CYANOCOBALAMIN) 1000 MCG tablet Take 1,000 mcg by mouth daily.     zinc gluconate 50 MG tablet Take 50 mg by mouth daily.      Results for orders placed or performed during the hospital encounter of 05/03/23 (from the past 48 hour(s))  Basic metabolic panel per protocol     Status: Abnormal   Collection Time: 05/03/23  2:30 PM  Result Value Ref Range   Sodium 141 135 - 145 mmol/L   Potassium 3.9 3.5 - 5.1 mmol/L   Chloride 111 98 - 111 mmol/L   CO2 20 (L) 22 - 32 mmol/L   Glucose, Bld 92 70 - 99 mg/dL    Comment: Glucose reference range applies only to samples taken after fasting for at least 8 hours.   BUN 22 8 - 23 mg/dL   Creatinine, Ser 1.61  (H) 0.61 - 1.24 mg/dL   Calcium 9.9 8.9 - 09.6 mg/dL   GFR, Estimated 37 (L) >60 mL/min    Comment: (NOTE) Calculated using the CKD-EPI Creatinine Equation (2021)    Anion gap 10 5 - 15    Comment: Performed at [facility], [address], [place], [place] [address]  CBC per protocol     Status: Abnormal   Collection Time: 05/03/23  2:30 PM  Result Value Ref Range   WBC 10.2 4.0 - 10.5 K/uL   RBC 3.58 (L) 4.22 - 5.81 MIL/uL   Hemoglobin 11.3 (L) 13.0 - 17.0 g/dL   HCT 98.1 (L)  39.0 - 52.0 %   MCV 98.9 80.0 - 100.0 fL   MCH 31.6 26.0 - 34.0 pg   MCHC 31.9 30.0 - 36.0 g/dL   RDW 78.2 95.6 - 21.3 %   Platelets 231 150 - 400 K/uL   nRBC 0.0 0.0 - 0.2 %    Comment: Performed at [facility], [address] [place], [place] [address]   No results found.  Review of Systems  All other systems reviewed and are negative.   Blood pressure 132/74, pulse 76, temperature 98.7 F (37.1 C), temperature source Oral, resp. rate 18, height 6' (1.829 m), weight 84.4 kg, SpO2 100%. Physical Exam Constitutional:      Appearance: Normal appearance. He is normal weight.  HENT:     Head: Normocephalic and atraumatic.     Right Ear: External ear normal.     Ears:     Comments: Left ear largely involved with ulcerative mass.    Nose: Nose normal.     Mouth/Throat:     Mouth: Mucous membranes are moist.     Pharynx: Oropharynx is clear.  Eyes:     Extraocular Movements: Extraocular movements intact.     Conjunctiva/sclera: Conjunctivae normal.     Pupils: Pupils are equal, round, and reactive to light.  Cardiovascular:     Rate and Rhythm: Normal rate.  Pulmonary:     Effort: Pulmonary effort is normal.  Musculoskeletal:     Cervical back: Normal range of motion.  Skin:    General: Skin is warm and dry.  Neurological:     General: No focal deficit present.     Mental Status: He is alert and oriented to person, place, and time.  Psychiatric:        Mood and Affect: Mood  normal.        Behavior: Behavior normal.        Thought Content: Thought content normal.        Judgment: Judgment normal.      Assessment/Plan Left external ear cancer  To OR for left auriculectomy.  Christia Reading, MD 05/04/2023, 2:39 PM

## 2023-05-04 NOTE — Brief Op Note (Signed)
05/04/2023  4:42 PM  PATIENT:  Charles Brooks  72 y.o. male  PRE-OPERATIVE DIAGNOSIS:  Cancer of left external ear  POST-OPERATIVE DIAGNOSIS:  Cancer of left external ear  PROCEDURE:  Procedure(s): LEFT AURICULECTOMY (Left)  SURGEON:  Surgeons and Role:    Christia Reading, MD - Primary  PHYSICIAN ASSISTANT:   ASSISTANTS: none   ANESTHESIA:   general  EBL:  10 mL   BLOOD ADMINISTERED:none  DRAINS: none   LOCAL MEDICATIONS USED:  LIDOCAINE   SPECIMEN:  Source of Specimen:  Left external ear  DISPOSITION OF SPECIMEN:  PATHOLOGY  COUNTS:  YES  TOURNIQUET:  * No tourniquets in log *  DICTATION: .Note written in EPIC  PLAN OF CARE: Discharge to home after PACU  PATIENT DISPOSITION:  PACU - hemodynamically stable.   Delay start of Pharmacological VTE agent (>24hrs) due to surgical blood loss or risk of bleeding: no

## 2023-05-04 NOTE — H&P (Incomplete)
Charles Brooks is an 72 y.o. male.   Chief Complaint: *** HPI: ***  Past Medical History:  Diagnosis Date  . Aneurysm (HCC)    in head per pt that sealed itself in 2007  . Arthritis   . CAD (coronary artery disease), native coronary artery    DES RCA and PTCA RVM 07/2014, LVEF 55%  . Cellulitis and abscess 08/08/2015  . CKD (chronic kidney disease) stage 3, GFR 30-59 ml/min (HCC)   . Essential hypertension   . History of kidney stones   . Hyperkalemia 07/06/2019  . Hyperlipidemia   . Hypothyroidism   . Neuropathy   . STEMI (ST elevation myocardial infarction) (HCC)    07/2014    Past Surgical History:  Procedure Laterality Date  . CHOLECYSTECTOMY    . COLONOSCOPY N/A 02/09/2021   Procedure: COLONOSCOPY;  Surgeon: Franky Macho, MD;  [facility];  Service: Gastroenterology;  Laterality: N/A;  . COLONOSCOPY WITH PROPOFOL N/A 11/08/2013   Procedure: COLONOSCOPY WITH PROPOFOL;  Surgeon: Dalia Heading, MD;  Location: [facility];  Service: General;  Laterality: N/A;  in cecum at 0741; cecal withdrawal time = 10 min  . coronary stents     . CYSTOSCOPY W/ RETROGRADES Bilateral 10/14/2021   Procedure: CYSTOSCOPY WITH RETROGRADE PYELOGRAM;  Surgeon: Milderd Meager., MD;  Location: [facility];  Service: Urology;  Laterality: Bilateral;  . CYSTOSCOPY WITH STENT PLACEMENT Bilateral 10/14/2021   Procedure: CYSTOSCOPY WITH STENT PLACEMENT;  Surgeon: Milderd Meager., MD;  Location: [facility];  Service: Urology;  Laterality: Bilateral;  . EXTRACORPOREAL SHOCK WAVE LITHOTRIPSY    . EXTRACORPOREAL SHOCK WAVE LITHOTRIPSY Right 09/22/2020   Procedure: EXTRACORPOREAL SHOCK WAVE LITHOTRIPSY (ESWL);  Surgeon: Malen Gauze, MD;  Location: [facility];  Service: Urology;  Laterality: Right;  cases not starting until 10:00 due to last minute add ons, need time to get covid tests back  . EXTRACORPOREAL SHOCK WAVE LITHOTRIPSY Left 10/26/2021   Procedure: EXTRACORPOREAL SHOCK WAVE LITHOTRIPSY  (ESWL);  Surgeon: Milderd Meager., MD;  [facility];  Service: Urology;  Laterality: Left;  . INCISION AND DRAINAGE ABSCESS Right 08/12/2015   Procedure: INCISION AND DRAINAGE ABSCESS;  Surgeon: Franky Macho, MD;  Location: [facility];  Service: General;  Laterality: Right;  . KNEE ARTHROSCOPY Right   . LEFT HEART CATHETERIZATION WITH CORONARY ANGIOGRAM N/A 08/14/2014   Procedure: LEFT HEART CATHETERIZATION WITH CORONARY ANGIOGRAM;  Surgeon: Lennette Bihari, MD;  LAD 50-60%, CFX OK, OM1 80%(small), RCA 100>>0% w/ 2.25x24 mm Synergy DES, EF 55%  . POLYPECTOMY N/A 11/08/2013   Procedure: POLYPECTOMY;  Surgeon: Dalia Heading, MD;  [facility];  Service: General;  Laterality: N/A;  cecal polyp  . POLYPECTOMY  02/09/2021   Procedure: POLYPECTOMY INTESTINAL;  Surgeon: Franky Macho, MD;  [facility];  Service: Gastroenterology;;  . QUADRICEPS TENDON REPAIR Left 10/10/2022   Procedure: OPEN REPAIR QUADRICEP TENDON;  Surgeon: Yolonda Kida, MD;  Location: [facility] OR;  Service: Orthopedics;  Laterality: Left;  90  . Removal of kidney stones     Open  . URETEROSCOPY WITH HOLMIUM LASER LITHOTRIPSY Right 10/14/2021   Procedure: URETEROSCOPY WITH HOLMIUM LASER LITHOTRIPSY;  Surgeon: Milderd Meager., MD;  Location: [facility];  Service: Urology;  Laterality: Right;  . WOUND DEBRIDEMENT Right 10/26/2015   Procedure: DEBRIDEMENT WOUND RIGHT FLANK;  Surgeon: Franky Macho, MD;  Location: [facility];  Service: General;  Laterality: Right;    Family History  Problem  Relation Age of Onset  . Heart attack Brother    Social History:  reports that he quit smoking about 19 years ago. His smoking use included cigars. He has never used smokeless tobacco. He reports that he does not drink alcohol and does not use drugs.  Allergies:  Allergies  Allergen Reactions  . Codeine Hives    Medications Prior to Admission  Medication Sig Dispense Refill  . acetaminophen (TYLENOL) 500 MG tablet  Take 1,000 mg by mouth every 6 (six) hours as needed for moderate pain (pain score 4-6) or mild pain (pain score 1-3).    Marland Kitchen aspirin 81 MG EC tablet Take 1 tablet (81 mg total) by mouth daily with breakfast. 30 tablet 1  . atorvastatin (LIPITOR) 40 MG tablet Take 1 tablet (40 mg total) by mouth daily. 10 tablet 0  . cefUROXime (CEFTIN) 500 MG tablet Take 500 mg by mouth 2 (two) times daily.    . cholecalciferol (VITAMIN D3) 25 MCG (1000 UNIT) tablet Take 1,000 Units by mouth daily.    Marland Kitchen docusate (COLACE) 50 MG/5ML liquid Take by mouth at bedtime.    . gabapentin (NEURONTIN) 100 MG capsule Take 1 capsule (100 mg total) by mouth at bedtime. (Patient taking differently: Take 100 mg by mouth 2 (two) times daily.)    . levothyroxine (SYNTHROID) 50 MCG tablet Take 50 mcg by mouth daily before breakfast.    . metoprolol tartrate (LOPRESSOR) 25 MG tablet Take 25 mg by mouth 2 (two) times daily.    . nitroGLYCERIN (NITROSTAT) 0.4 MG SL tablet Place 1 tablet (0.4 mg total) under the tongue every 5 (five) minutes x 3 doses as needed for chest pain. 25 tablet 12  . traMADol (ULTRAM) 50 MG tablet Take 50 mg by mouth every 6 (six) hours as needed.    . vitamin B-12 (CYANOCOBALAMIN) 1000 MCG tablet Take 1,000 mcg by mouth daily.    Marland Kitchen zinc gluconate 50 MG tablet Take 50 mg by mouth daily.      Results for orders placed or performed during the hospital encounter of 05/03/23 (from the past 48 hour(s))  Basic metabolic panel per protocol     Status: Abnormal   Collection Time: 05/03/23  2:30 PM  Result Value Ref Range   Sodium 141 135 - 145 mmol/L   Potassium 3.9 3.5 - 5.1 mmol/L   Chloride 111 98 - 111 mmol/L   CO2 20 (L) 22 - 32 mmol/L   Glucose, Bld 92 70 - 99 mg/dL    Comment: Glucose reference range applies only to samples taken after fasting for at least 8 hours.   BUN 22 8 - 23 mg/dL   Creatinine, Ser 4.09 (H) 0.61 - 1.24 mg/dL   Calcium 9.9 8.9 - 81.1 mg/dL   GFR, Estimated 37 (L) >60 mL/min     Comment: (NOTE) Calculated using the CKD-EPI Creatinine Equation (2021)    Anion gap 10 5 - 15    Comment: Performed at [facility], [address], [place], [place] [address]  CBC per protocol     Status: Abnormal   Collection Time: 05/03/23  2:30 PM  Result Value Ref Range   WBC 10.2 4.0 - 10.5 K/uL   RBC 3.58 (L) 4.22 - 5.81 MIL/uL   Hemoglobin 11.3 (L) 13.0 - 17.0 g/dL   HCT 29.5 (L) 62.1 - 30.8 %   MCV 98.9 80.0 - 100.0 fL   MCH 31.6 26.0 - 34.0 pg   MCHC  31.9 30.0 - 36.0 g/dL   RDW 16.1 09.6 - 04.5 %   Platelets 231 150 - 400 K/uL   nRBC 0.0 0.0 - 0.2 %    Comment: Performed at [facility], [address] [place], [place] [address]   No results found.  Review of Systems  Blood pressure 132/74, pulse 76, temperature 98.7 F (37.1 C), temperature source Oral, resp. rate 18, height 6' (1.829 m), weight 84.4 kg, SpO2 100%. Physical Exam   Assessment/Plan ***  Christia Reading, MD 05/04/2023, 2:31 PM

## 2023-05-04 NOTE — Transfer of Care (Signed)
Immediate Anesthesia Transfer of Care Note  Patient: KARDELL GONZALEZLOPEZ  Procedure(s) Performed: LEFT AURICULECTOMY (Left: Ear)  Patient Location: PACU  Anesthesia Type:General  Level of Consciousness: awake and alert   Airway & Oxygen Therapy: Patient Spontanous Breathing  Post-op Assessment: Report given to RN and Post -op Vital signs reviewed and stable  Post vital signs: Reviewed and stable  Last Vitals:  Vitals Value Taken Time  BP    Temp    Pulse 93 05/04/23 1650  Resp    SpO2 80 % 05/04/23 1650  Vitals shown include unfiled device data.  Last Pain:  Vitals:   05/04/23 1317  TempSrc:   PainSc: 9       Patients Stated Pain Goal: 2 (05/04/23 1317)  Complications: No notable events documented.

## 2023-05-04 NOTE — Anesthesia Procedure Notes (Signed)
Procedure Name: Intubation Date/Time: 05/04/2023 3:12 PM  Performed by: Hali Marry, CRNAPre-anesthesia Checklist: Patient identified, Emergency Drugs available, Suction available and Patient being monitored Patient Re-evaluated:Patient Re-evaluated prior to induction Oxygen Delivery Method: Circle system utilized Preoxygenation: Pre-oxygenation with 100% oxygen Induction Type: IV induction Ventilation: Mask ventilation without difficulty Laryngoscope Size: Mac and 4 Grade View: Grade I Tube type: Oral Number of attempts: 1 Airway Equipment and Method: Stylet and Oral airway Placement Confirmation: ETT inserted through vocal cords under direct vision, positive ETCO2 and breath sounds checked- equal and bilateral Secured at: 22 cm Tube secured with: Tape Dental Injury: Teeth and Oropharynx as per pre-operative assessment

## 2023-05-05 ENCOUNTER — Encounter (HOSPITAL_COMMUNITY): Payer: Self-pay | Admitting: Otolaryngology

## 2023-05-05 NOTE — Anesthesia Postprocedure Evaluation (Signed)
Anesthesia Post Note  Patient: Charles Brooks  Procedure(s) Performed: LEFT AURICULECTOMY (Left: Ear)     Patient location during evaluation: PACU Anesthesia Type: General Level of consciousness: sedated and patient cooperative Pain management: pain level controlled Vital Signs Assessment: post-procedure vital signs reviewed and stable Respiratory status: spontaneous breathing Cardiovascular status: stable Anesthetic complications: no   No notable events documented.  Last Vitals:  Vitals:   05/04/23 1715 05/04/23 1730  BP: 113/61 (!) 104/56  Pulse: 80 78  Resp: 16 17  Temp: 37.4 C   SpO2: 95% 93%    Last Pain:  Vitals:   05/04/23 1715  TempSrc:   PainSc: 0-No pain                 Lewie Loron

## 2023-05-08 LAB — SURGICAL PATHOLOGY

## 2023-05-31 DIAGNOSIS — M1711 Unilateral primary osteoarthritis, right knee: Secondary | ICD-10-CM | POA: Diagnosis not present

## 2023-06-28 ENCOUNTER — Encounter: Payer: Self-pay | Admitting: Cardiology

## 2023-06-28 ENCOUNTER — Ambulatory Visit: Payer: PPO | Attending: Cardiology | Admitting: Cardiology

## 2023-06-28 VITALS — BP 128/62 | HR 77 | Ht 72.0 in | Wt 188.4 lb

## 2023-06-28 DIAGNOSIS — I25119 Atherosclerotic heart disease of native coronary artery with unspecified angina pectoris: Secondary | ICD-10-CM | POA: Diagnosis not present

## 2023-06-28 DIAGNOSIS — E782 Mixed hyperlipidemia: Secondary | ICD-10-CM | POA: Diagnosis not present

## 2023-06-28 NOTE — Progress Notes (Signed)
    Cardiology Office Note  Date: 06/28/2023   ID: Charles Brooks, Charles Brooks 03-04-1951, MRN [id number]  History of Present Illness: Charles Brooks is a 73 y.o. male last seen in March 2024.  He is here for a follow-up visit.  Overall doing well from a cardiac perspective, he denies angina or nitroglycerin  use in the interim, stable NYHA class II dyspnea.  He did have skin cancer involving his left ear which ultimately had to be resected, this has healed well.  I reviewed his medications.  Current cardiovascular regimen includes aspirin , Lipitor , Lopressor , and as needed nitroglycerin .  LDL was 51 in June 2024.  Physical Exam: VS:  BP 128/62 (BP Location: Left Arm, Cuff Size: Normal)   Pulse 77   Ht 6' (1.829 m)   Wt 188 lb 6.4 oz (85.5 kg)   SpO2 98%   BMI 25.55 kg/m , BMI Body mass index is 25.55 kg/m.  Wt Readings from Last 3 Encounters:  06/28/23 188 lb 6.4 oz (85.5 kg)  05/04/23 186 lb (84.4 kg)  05/03/23 186 lb 9.6 oz (84.6 kg)    General: Patient appears comfortable at rest. HEENT: Conjunctiva and lids normal. Neck: Supple, no elevated JVP or carotid bruits. Lungs: Clear to auscultation, nonlabored breathing at rest. Cardiac: Regular rate and rhythm, no S3 or significant systolic murmur. Extremities: No pitting edema.  ECG:  An ECG dated 09/08/2022 was personally reviewed today and demonstrated:  Sinus rhythm with nonspecific ST-T changes.  Labwork: June 2024: Cholesterol 109, triglycerides 112, HDL 37, LDL 51 05/03/2023: BUN 22; Creatinine, Ser 1.88; Hemoglobin 11.3; Platelets 231; Potassium 3.9; Sodium 141   Other Studies Reviewed Today:  No interval cardiac testing for review today.  Assessment and Plan:  1.  CAD status post DES to the RCA and angioplasty of the RV marginal in 2016.  LVEF approximately 55% at that time.  He reports no angina or nitroglycerin  use and continues to prefer conservative follow-up at this point on medical therapy.  I reviewed his last  ECG.  Continue aspirin , Lipitor , Lopressor , and as needed nitroglycerin .   2.  Mixed hyperlipidemia on Lipitor .  LDL 51 in June 2024.  No changes made today.  Disposition:  Follow up  6 months.  Signed, Jayson JUDITHANN Sierras, M.D., F.A.C.C. [facility]

## 2023-06-28 NOTE — Patient Instructions (Addendum)

## 2023-07-04 DIAGNOSIS — C44209 Unspecified malignant neoplasm of skin of left ear and external auricular canal: Secondary | ICD-10-CM | POA: Diagnosis not present

## 2023-07-04 DIAGNOSIS — L923 Foreign body granuloma of the skin and subcutaneous tissue: Secondary | ICD-10-CM | POA: Diagnosis not present

## 2023-07-05 DIAGNOSIS — M1711 Unilateral primary osteoarthritis, right knee: Secondary | ICD-10-CM | POA: Diagnosis not present

## 2023-07-11 ENCOUNTER — Telehealth: Payer: Self-pay | Admitting: Radiation Oncology

## 2023-07-11 NOTE — Telephone Encounter (Signed)
Called patient to schedule a consultation w. Dr. Squire. No answer, LVM for a return call.  

## 2023-07-12 DIAGNOSIS — M1711 Unilateral primary osteoarthritis, right knee: Secondary | ICD-10-CM | POA: Diagnosis not present

## 2023-07-12 NOTE — Progress Notes (Signed)
Head and Neck Cancer Location of Tumor / Histology: Worsening left ear ulcerative mass. Cancer of left external ear.  Patient presented  months ago with symptoms of: Painful pimple on left ear back in April that would bleeding. Left ear was swollen.  Biopsies revealed: Invasive squamous cell carcinoma. Two lesions 4.7 and 3.6 cm carcinoma up to 1.4 cm in depth carcinoma extending to the underlying cartilage.  Nutrition Status Yes No Comments  Weight changes? [x]  []  Weight fluctuates   Swallowing concerns? []  [x]    PEG? []  [x]     Referrals Yes No Comments  Social Work? []  [x]    Dentistry? [x]  []    Swallowing therapy? []  [x]    Nutrition? []  [x]    Med/Onc? []  [x]     Safety Issues Yes No Comments  Prior radiation? []  [x]    Pacemaker/ICD? []  [x]    Possible current pregnancy? []  [x]    Is the patient on methotrexate? []  [x]     Tobacco/Marijuana/Snuff/ETOH use: None  Past/Anticipated interventions by otolaryngology, if any: Left Auriculectomy on 05/04/2023.  Past/Anticipated interventions by medical oncology, if any: N/A     Current Complaints / other details:  None  BP 123/84 (BP Location: Left Arm, Patient Position: Sitting, Cuff Size: Normal)   Pulse (!) 52   Temp 97.7 F (36.5 C)   Resp 20   Ht 6' (1.829 m)   Wt 189 lb 9.6 oz (86 kg)   SpO2 100%   BMI 25.71 kg/m

## 2023-07-13 NOTE — Progress Notes (Signed)
Radiation Oncology         [phone] ________________________________  Initial Outpatient Consultation  Name: PRAJWAL FELLNER MRN: [id number]  Date: 07/14/2023  DOB: 1950-07-02  NW:GNFA, Angelina Pih, MD  Christia Reading, MD   REFERRING PHYSICIAN: Christia Reading, MD  DIAGNOSIS:    ICD-10-CM   1. 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 male who presented to Dr. Jenne Pane on 04/04/23 for evaluation of two lesions located on his external left ear. Two biopsies of the left ear were collected on that date which both showed findings consistent with invasive squamous cell carcinoma.   Staging work-up performed consisted of a soft tissue neck CT with contrast on 04/20/23 which demonstrated nonspecific diffuse enlargement/edema of the left ear/pinna, without evidence of adjacent bone involvement, lymphadenopathy, or regional metastatic disease.      He opted to proceed with a left auriculectomy on 05/04/23 under the care of Dr. Jenne Pane. Pathology from the procedure revealed: two lesions the size of 4.7 cm and 3.6 cm respectively; histology of invasive well to moderately differentiated keratinizing squamous cell carcinoma invading a depth of up to 1.4 cm, and extending to the underlying ear cartilage; left posterior central margin positive for invasive SCC (the permanent recuts of the frozen section from the central posterior margin showed a focus of invasive squamous cell carcinoma which was not identified in the frozen section slides).   In most recent history, he some skin from the central portion of ear excision site was re-excised on 07/04/23. Pathology showed no evidence of residual  carcinoma, and findings consistent with a post-surgical scar and sutura granuloma.    Pain status: none  Other symptoms: none  Tobacco history, if any: former smoker quit 19 years ago   Report left leg weakness due to knee surgery. Uses a cane. His wife has medical issues and they live in [place].  PREVIOUS RADIATION THERAPY: No  PAST MEDICAL HISTORY:  has a past medical history of Aneurysm (HCC), Arthritis, CAD (coronary artery disease), native coronary artery, Cellulitis and abscess (08/08/2015), CKD (chronic kidney disease) stage 3, GFR 30-59 ml/min (HCC), Essential hypertension, History of kidney stones, Hyperkalemia (07/06/2019), Hyperlipidemia, Hypothyroidism, Neuropathy, and STEMI (ST elevation myocardial infarction) (HCC).    PAST SURGICAL HISTORY: Past Surgical History:  Procedure Laterality Date   CHOLECYSTECTOMY     COLONOSCOPY N/A 02/09/2021   Procedure: COLONOSCOPY;  Surgeon: Franky Macho, MD;  [facility];  Service: Gastroenterology;  Laterality: N/A;   COLONOSCOPY WITH PROPOFOL N/A 11/08/2013   Procedure: COLONOSCOPY WITH PROPOFOL;  Surgeon: Dalia Heading, MD;  Location: [facility];  Service: General;  Laterality: N/A;  in cecum at 0741; cecal withdrawal time = 10 min   coronary stents      CYSTOSCOPY W/ RETROGRADES Bilateral 10/14/2021   Procedure: CYSTOSCOPY WITH RETROGRADE PYELOGRAM;  Surgeon: Milderd Meager., MD;  Location: [facility];  Service: Urology;  Laterality: Bilateral;   CYSTOSCOPY WITH STENT PLACEMENT Bilateral 10/14/2021   Procedure: CYSTOSCOPY WITH STENT PLACEMENT;  Surgeon: Milderd Meager., MD;  Location: [facility];  Service: Urology;  Laterality: Bilateral;   EAR CYST EXCISION Left 05/04/2023   Procedure: LEFT AURICULECTOMY;  Surgeon: Christia Reading,  MD;  Location: [facility];  Service: ENT;  Laterality: Left;   EXTRACORPOREAL SHOCK WAVE LITHOTRIPSY     EXTRACORPOREAL SHOCK WAVE LITHOTRIPSY Right 09/22/2020   Procedure: EXTRACORPOREAL SHOCK  WAVE LITHOTRIPSY (ESWL);  Surgeon: Malen Gauze, MD;  Location: [facility];  Service: Urology;  Laterality: Right;  cases not starting until 10:00 due to last minute add ons, need time to get covid tests back   EXTRACORPOREAL SHOCK WAVE LITHOTRIPSY Left 10/26/2021   Procedure: EXTRACORPOREAL SHOCK WAVE LITHOTRIPSY (ESWL);  Surgeon: Milderd Meager., MD;  Location: [facility];  Service: Urology;  Laterality: Left;   INCISION AND DRAINAGE ABSCESS Right 08/12/2015   Procedure: INCISION AND DRAINAGE ABSCESS;  Surgeon: Franky Macho, MD;  Location: [facility];  Service: General;  Laterality: Right;   KNEE ARTHROSCOPY Right    LEFT HEART CATHETERIZATION WITH CORONARY ANGIOGRAM N/A 08/14/2014   Procedure: LEFT HEART CATHETERIZATION WITH CORONARY ANGIOGRAM;  Surgeon: Lennette Bihari, MD;  LAD 50-60%, CFX OK, OM1 80%(small), RCA 100>>0% w/ 2.25x24 mm Synergy DES, EF 55%   POLYPECTOMY N/A 11/08/2013   Procedure: POLYPECTOMY;  Surgeon: Dalia Heading, MD;  Location: [facility];  Service: General;  Laterality: N/A;  cecal polyp   POLYPECTOMY  02/09/2021   Procedure: POLYPECTOMY INTESTINAL;  Surgeon: Franky Macho, MD;  [facility];  Service: Gastroenterology;;   QUADRICEPS TENDON REPAIR Left 10/10/2022   Procedure: OPEN REPAIR QUADRICEP TENDON;  Surgeon: Yolonda Kida, MD;  Location: [facility] OR;  Service: Orthopedics;  Laterality: Left;  90   Removal of kidney stones     Open   URETEROSCOPY WITH HOLMIUM LASER LITHOTRIPSY Right 10/14/2021   Procedure: URETEROSCOPY WITH HOLMIUM LASER LITHOTRIPSY;  Surgeon: Milderd Meager., MD;  Location: [facility];  Service: Urology;  Laterality: Right;   WOUND DEBRIDEMENT Right 10/26/2015   Procedure: DEBRIDEMENT WOUND RIGHT FLANK;  Surgeon: Franky Macho, MD;  Location: [facility];  Service: General;  Laterality: Right;    FAMILY HISTORY: family history includes Heart attack in his brother.  SOCIAL HISTORY:  reports that he has never smoked. He has never used  smokeless tobacco. He reports that he does not drink alcohol and does not use drugs.  ALLERGIES: Codeine  MEDICATIONS:  Current Outpatient Medications  Medication Sig Dispense Refill   acetaminophen (TYLENOL) 500 MG tablet Take 1,000 mg by mouth every 6 (six) hours as needed for moderate pain (pain score 4-6) or mild pain (pain score 1-3).     aspirin 81 MG EC tablet Take 1 tablet (81 mg total) by mouth daily with breakfast. 30 tablet 1   atorvastatin (LIPITOR) 40 MG tablet Take 1 tablet (40 mg total) by mouth daily. 10 tablet 0   cholecalciferol (VITAMIN D3) 25 MCG (1000 UNIT) tablet Take 1,000 Units by mouth daily.     docusate (COLACE) 50 MG/5ML liquid Take by mouth at bedtime.     gabapentin (NEURONTIN) 100 MG capsule Take 1 capsule (100 mg total) by mouth at bedtime. (Patient taking differently: Take 100 mg by mouth 2 (two) times daily.)     levothyroxine (SYNTHROID) 50 MCG tablet Take 50 mcg by mouth daily before breakfast.     metoprolol tartrate (LOPRESSOR) 25 MG tablet Take 25 mg by mouth 2 (two) times daily.     nitroGLYCERIN (NITROSTAT) 0.4 MG SL tablet Place 1 tablet (0.4 mg total) under the tongue every 5 (five) minutes x 3 doses as needed for chest pain. 25 tablet 12   vitamin B-12 (CYANOCOBALAMIN)  1000 MCG tablet Take 1,000 mcg by mouth daily.     zinc gluconate 50 MG tablet Take 50 mg by mouth daily.     meloxicam (MOBIC) 7.5 MG tablet Take 7.5 mg by mouth daily. (Patient not taking: Reported on 07/14/2023)     No current facility-administered medications for this encounter.    REVIEW OF SYSTEMS:  Notable for that above.   PHYSICAL EXAM:  height is 6' (1.829 m) and weight is 189 lb 9.6 oz (86 kg). His temperature is 97.7 F (36.5 C). His blood pressure is 123/84 and his pulse is 52 (abnormal). His respiration is 20 and oxygen saturation is 100%.   General: Alert and oriented, in no acute distress HEENT: Head is normocephalic. Extraocular movements are intact. Oropharynx is  notable for no lesions. Missing many teeth and has upper dentures.  Missing 98% of Left external ear. No sign of local progression. Hearing grossly intact b/l Neck: Neck is notable for no masses Heart: Regular in rate and rhythm with no murmurs, rubs, or gallops. Chest: Clear to auscultation bilaterally, with no rhonchi, wheezes, or rales. Abdomen: Soft, nontender, nondistended, with no rigidity or guarding. Extremities: No cyanosis or edema. Lymphatics: see Neck Exam Skin: No concerning lesions. Musculoskeletal: symmetric strength and muscle tone throughout. Neurologic: Cranial nerves II through XII are grossly intact. No obvious focalities. Speech is fluent. Coordination is intact. Psychiatric: Judgment and insight are intact. Affect is appropriate.   ECOG = 2  0 - Asymptomatic (Fully active, able to carry on all predisease activities without restriction)  1 - Symptomatic but completely ambulatory (Restricted in physically strenuous activity but ambulatory and able to carry out work of a light or sedentary nature. For example, light housework, office work)  2 - Symptomatic, <50% in bed during the day (Ambulatory and capable of all self care but unable to carry out any work activities. Up and about more than 50% of waking hours)  3 - Symptomatic, >50% in bed, but not bedbound (Capable of only limited self-care, confined to bed or chair 50% or more of waking hours)  4 - Bedbound (Completely disabled. Cannot carry on any self-care. Totally confined to bed or chair)  5 - Death   Santiago Glad MM, Creech RH, Tormey DC, et al. [phone]). "Toxicity and response criteria of the [facility]". Am. Evlyn Clines. Oncol. 5 (6): 649-55   LABORATORY DATA:  Lab Results  Component Value Date   WBC 10.2 05/03/2023   HGB 11.3 (L) 05/03/2023   HCT 35.4 (L) 05/03/2023   MCV 98.9 05/03/2023   PLT 231 05/03/2023   CMP     Component Value Date/Time   NA 141 05/03/2023 1430   NA 138  11/18/2021 0953   K 3.9 05/03/2023 1430   CL 111 05/03/2023 1430   CO2 20 (L) 05/03/2023 1430   GLUCOSE 92 05/03/2023 1430   BUN 22 05/03/2023 1430   BUN 21 11/18/2021 0953   CREATININE 1.88 (H) 05/03/2023 1430   CALCIUM 9.9 05/03/2023 1430   PROT 7.3 10/14/2021 1224   ALBUMIN 4.0 10/14/2021 1224   AST 16 10/14/2021 1224   ALT 21 10/14/2021 1224   ALKPHOS 60 10/14/2021 1224   BILITOT 0.9 10/14/2021 1224   GFRNONAA 37 (L) 05/03/2023 1430   GFRAA 51 (L) 07/07/2019 0743      Lab Results  Component Value Date   TSH 3.423 08/14/2014     RADIOGRAPHY: CT neck as above    IMPRESSION/PLAN:  This is a delightful patient with head and neck cancer. I recommend radiotherapy for this patient.  We discussed the potential risks, benefits, and side effects of radiotherapy. We talked in detail about acute and late effects. We discussed that some of the most bothersome acute effects may be mucositis, dysgeusia, salivary changes, skin irritation, hair loss, dehydration, weight loss and fatigue. We talked about late effects which include but are not necessarily limited to worsening of hypothyroidism, lymphedema , hearing loss , nerve injury, vascular injury, spinal cord injury, xerostomia, non-healing wound, and rare potentially fatal injury to any of the tissues in the head and neck region. No guarantees of treatment were given. A consent form was signed and placed in the patient's medical record. The patient is enthusiastic about proceeding with treatment. I look forward to participating in the patient's care.   We talked about standard hypofractionated radiation over 4 weeks versus a standard 6-week regimen.  Given his comorbidities and other stressors in life, I think a 4-week regimen would be a better fit for him.  We discussed that this is a less standard type of treatment and may carry a slightly higher risk of scar tissue formation in the future, but he agrees that a 4-week regimen is preferable  given all of the factors that need to be considered in his wellbeing.  Simulation (treatment planning) will take place in the near future  We also discussed that the treatment of head and neck cancer is a multidisciplinary process to maximize treatment outcomes and quality of life. For this reason the following referrals have been or will be made:   Nutritionist for nutrition support during and after treatment.   Social work for social support.    Physical therapy due to risk of lymphedema in neck and deconditioning.    On date of service, in total, I spent 60 minutes on this encounter. Patient was seen in person.  __________________________________________   Lonie Peak, MD  This document serves as a record of services personally performed by Lonie Peak, MD. It was created on her behalf by Neena Rhymes, a trained medical scribe. The creation of this record is based on the scribe's personal observations and the provider's statements to them. This document has been checked and approved by the attending provider.

## 2023-07-14 ENCOUNTER — Encounter: Payer: Self-pay | Admitting: Radiation Oncology

## 2023-07-14 ENCOUNTER — Ambulatory Visit
Admission: RE | Admit: 2023-07-14 | Discharge: 2023-07-14 | Disposition: A | Discharge status: Home / Self Care | Payer: PPO | Source: Ambulatory Visit | Attending: Radiation Oncology

## 2023-07-14 ENCOUNTER — Ambulatory Visit
Admission: RE | Admit: 2023-07-14 | Discharge: 2023-07-14 | Disposition: A | Discharge status: Home / Self Care | Payer: PPO | Source: Ambulatory Visit | Attending: Radiation Oncology | Admitting: Radiation Oncology

## 2023-07-14 VITALS — BP 123/84 | HR 52 | Temp 97.7°F | Resp 20 | Ht 72.0 in | Wt 189.6 lb

## 2023-07-14 DIAGNOSIS — Z791 Long term (current) use of non-steroidal anti-inflammatories (NSAID): Secondary | ICD-10-CM | POA: Insufficient documentation

## 2023-07-14 DIAGNOSIS — I252 Old myocardial infarction: Secondary | ICD-10-CM | POA: Diagnosis not present

## 2023-07-14 DIAGNOSIS — Z79899 Other long term (current) drug therapy: Secondary | ICD-10-CM | POA: Insufficient documentation

## 2023-07-14 DIAGNOSIS — I2510 Atherosclerotic heart disease of native coronary artery without angina pectoris: Secondary | ICD-10-CM | POA: Diagnosis not present

## 2023-07-14 DIAGNOSIS — Z87442 Personal history of urinary calculi: Secondary | ICD-10-CM | POA: Insufficient documentation

## 2023-07-14 DIAGNOSIS — I129 Hypertensive chronic kidney disease with stage 1 through stage 4 chronic kidney disease, or unspecified chronic kidney disease: Secondary | ICD-10-CM | POA: Diagnosis not present

## 2023-07-14 DIAGNOSIS — R531 Weakness: Secondary | ICD-10-CM | POA: Insufficient documentation

## 2023-07-14 DIAGNOSIS — E785 Hyperlipidemia, unspecified: Secondary | ICD-10-CM | POA: Diagnosis not present

## 2023-07-14 DIAGNOSIS — Z7982 Long term (current) use of aspirin: Secondary | ICD-10-CM | POA: Diagnosis not present

## 2023-07-14 DIAGNOSIS — Z79890 Hormone replacement therapy (postmenopausal): Secondary | ICD-10-CM | POA: Diagnosis not present

## 2023-07-14 DIAGNOSIS — C44229 Squamous cell carcinoma of skin of left ear and external auricular canal: Secondary | ICD-10-CM | POA: Insufficient documentation

## 2023-07-14 DIAGNOSIS — C44209 Unspecified malignant neoplasm of skin of left ear and external auricular canal: Secondary | ICD-10-CM | POA: Insufficient documentation

## 2023-07-14 DIAGNOSIS — E039 Hypothyroidism, unspecified: Secondary | ICD-10-CM | POA: Diagnosis not present

## 2023-07-14 DIAGNOSIS — N183 Chronic kidney disease, stage 3 unspecified: Secondary | ICD-10-CM | POA: Diagnosis not present

## 2023-07-14 NOTE — Progress Notes (Signed)
Oncology Nurse Navigator Documentation   Met with patient before his initial consult with Dr. Basilio Cairo.   I introduced myself as his/their Navigator, explained my role as a member of the Care Team. He verbalized understanding of information provided. I provided him with my direct contact information and encouraged him to call with questions/concerns moving forward.  Hedda Slade, RN, BSN, OCN Head & Neck Oncology Nurse Navigator [facility] [place] [phone]

## 2023-07-18 ENCOUNTER — Ambulatory Visit
Admission: RE | Admit: 2023-07-18 | Discharge: 2023-07-18 | Disposition: A | Discharge status: Home / Self Care | Payer: HMO | Source: Ambulatory Visit | Attending: Radiation Oncology | Admitting: Radiation Oncology

## 2023-07-18 DIAGNOSIS — C44229 Squamous cell carcinoma of skin of left ear and external auricular canal: Secondary | ICD-10-CM | POA: Insufficient documentation

## 2023-07-18 DIAGNOSIS — Z510 Encounter for antineoplastic radiation therapy: Secondary | ICD-10-CM | POA: Diagnosis not present

## 2023-07-19 DIAGNOSIS — M1711 Unilateral primary osteoarthritis, right knee: Secondary | ICD-10-CM | POA: Diagnosis not present

## 2023-07-21 ENCOUNTER — Other Ambulatory Visit: Payer: Self-pay

## 2023-07-21 DIAGNOSIS — Z510 Encounter for antineoplastic radiation therapy: Secondary | ICD-10-CM | POA: Diagnosis not present

## 2023-07-21 DIAGNOSIS — C44229 Squamous cell carcinoma of skin of left ear and external auricular canal: Secondary | ICD-10-CM | POA: Diagnosis not present

## 2023-07-24 ENCOUNTER — Inpatient Hospital Stay: Payer: HMO | Attending: Radiation Oncology

## 2023-07-24 DIAGNOSIS — Z299 Encounter for prophylactic measures, unspecified: Secondary | ICD-10-CM | POA: Diagnosis not present

## 2023-07-24 DIAGNOSIS — D692 Other nonthrombocytopenic purpura: Secondary | ICD-10-CM | POA: Diagnosis not present

## 2023-07-24 DIAGNOSIS — I700 Atherosclerosis of aorta: Secondary | ICD-10-CM | POA: Diagnosis not present

## 2023-07-24 DIAGNOSIS — N1832 Chronic kidney disease, stage 3b: Secondary | ICD-10-CM | POA: Diagnosis not present

## 2023-07-24 DIAGNOSIS — I10 Essential (primary) hypertension: Secondary | ICD-10-CM | POA: Diagnosis not present

## 2023-07-24 DIAGNOSIS — C44221 Squamous cell carcinoma of skin of unspecified ear and external auricular canal: Secondary | ICD-10-CM | POA: Diagnosis not present

## 2023-07-24 NOTE — Progress Notes (Signed)
[facility] Clinical Social Work  Initial Assessment   Charles Brooks is a 73 y.o. year old male contacted by phone. Clinical Social Work was referred by new patient protocol for assessment of psychosocial needs.   SDOH (Social Determinants of Health) assessments performed: No   SDOH Screenings   Food Insecurity: No Food Insecurity (07/14/2023)  Housing: Low Risk  (07/14/2023)  Transportation Needs: No Transportation Needs (07/14/2023)  Utilities: Not At Risk (07/14/2023)  Depression (PHQ2-9): Low Risk  (07/14/2023)  Tobacco Use: Low Risk  (07/14/2023)  Recent Concern: Tobacco Use - Medium Risk (07/14/2023)   Family/Social Information:  Housing Arrangement: patient lives with his spouse. Family members/support persons in your life? Family, Friends, and [employer] Financial concerns: no, patient will be transporting himself to daily radiation. Patient's spouse or siblings will assist with transportation if needed.  Employment: Retired Patient retired in 2016.  Income source: Social Security Retirement and [employer] Income Financial concerns:  No financial concerns at this time. Type of concern: None Food access concerns: no Religious or spiritual practice: Apostolic , has church community. Advanced directives: Not known Services Currently in place:  Insurance, Transportation, Income, Supportive Family.  Coping/ Adjustment to diagnosis: Patient understands treatment plan and what happens next? yes Concerns about diagnosis and/or treatment: I'm not especially worried about anything Patient reported stressors:  No Stressors reported at this time. Hopes and/or priorities: To Complete treatment and minimal medical bills. Patient enjoys being outside, time with family/ friends, and spending time at flea markets and similar activities. Current coping skills/ strengths: Ability for insight , Active sense of humor , Average or above average intelligence , Capable of independent living ,  Communication skills , General fund of knowledge , Motivation for treatment/growth , Religious Affiliation , Special hobby/interest , and Supportive family/friends     SUMMARY: Current SDOH Barriers:  No SDOH Barriers at this time  Clinical Social Work Clinical Goal(s):  No clinical social work goals at this time  Interventions: Discussed common feeling and emotions when being diagnosed with cancer, and the importance of support during treatment Informed patient of the support team roles and support services at [facility] Provided CSW contact information and encouraged patient to call with any questions or concerns   Follow Up Plan: CSW will follow-up with patient by phone  Patient verbalizes understanding of plan: Yes  Marguerita Merles, LCSW Clinical Social Worker [facility]

## 2023-07-27 ENCOUNTER — Encounter: Payer: Self-pay | Admitting: Physical Therapy

## 2023-07-27 ENCOUNTER — Other Ambulatory Visit: Payer: Self-pay

## 2023-07-27 ENCOUNTER — Ambulatory Visit: Payer: PPO | Attending: Radiation Oncology | Admitting: Physical Therapy

## 2023-07-27 ENCOUNTER — Ambulatory Visit
Admission: RE | Admit: 2023-07-27 | Discharge: 2023-07-27 | Disposition: A | Discharge status: Home / Self Care | Payer: PPO | Source: Ambulatory Visit | Attending: Radiation Oncology | Admitting: Radiation Oncology

## 2023-07-27 DIAGNOSIS — C44229 Squamous cell carcinoma of skin of left ear and external auricular canal: Secondary | ICD-10-CM | POA: Diagnosis not present

## 2023-07-27 DIAGNOSIS — Z510 Encounter for antineoplastic radiation therapy: Secondary | ICD-10-CM | POA: Insufficient documentation

## 2023-07-27 DIAGNOSIS — R293 Abnormal posture: Secondary | ICD-10-CM | POA: Insufficient documentation

## 2023-07-27 LAB — RAD ONC ARIA SESSION SUMMARY
Course Elapsed Days: 0
Plan Fractions Treated to Date: 1
Plan Prescribed Dose Per Fraction: 2.5 Gy
Plan Total Fractions Prescribed: 20
Plan Total Prescribed Dose: 50 Gy
Reference Point Dosage Given to Date: 2.5 Gy
Reference Point Session Dosage Given: 2.5 Gy
Session Number: 1

## 2023-07-27 NOTE — Therapy (Signed)
 OUTPATIENT PHYSICAL THERAPY HEAD AND NECK BASELINE EVALUATION   Patient Name: Charles Brooks MRN: [id number] DOB:[birth year], 73 y.o., male Today's Date: 07/27/2023  END OF SESSION:  PT End of Session - 07/27/23 1141     Visit Number 1    Number of Visits 2    Date for PT Re-Evaluation 09/21/23    PT Start Time 1051    PT Stop Time 1140    PT Time Calculation (min) 49 min    Activity Tolerance Patient tolerated treatment well    Behavior During Therapy [facility] for tasks assessed/performed             Past Medical History:  Diagnosis Date   Aneurysm (HCC)    in head per pt that sealed itself in 2007   Arthritis    CAD (coronary artery disease), native coronary artery    DES RCA and PTCA RVM 07/2014, LVEF 55%   Cellulitis and abscess 08/08/2015   CKD (chronic kidney disease) stage 3, GFR 30-59 ml/min (HCC)    Essential hypertension    History of kidney stones    Hyperkalemia 07/06/2019   Hyperlipidemia    Hypothyroidism    Neuropathy    STEMI (ST elevation myocardial infarction) (HCC)    07/2014   Past Surgical History:  Procedure Laterality Date   CHOLECYSTECTOMY     COLONOSCOPY N/A 02/09/2021   Procedure: COLONOSCOPY;  Surgeon: Mavis Anes, MD;  [facility];  Service: Gastroenterology;  Laterality: N/A;   COLONOSCOPY WITH PROPOFOL  N/A 11/08/2013   Procedure: COLONOSCOPY WITH PROPOFOL ;  Surgeon: Anes DELENA Mavis, MD;  Location: [facility];  Service: General;  Laterality: N/A;  in cecum at 0741; cecal withdrawal time = 10 min   coronary stents      CYSTOSCOPY W/ RETROGRADES Bilateral 10/14/2021   Procedure: CYSTOSCOPY WITH RETROGRADE PYELOGRAM;  Surgeon: Roseann Adine PARAS., MD;  Location: [facility];  Service: Urology;  Laterality: Bilateral;   CYSTOSCOPY WITH STENT PLACEMENT Bilateral 10/14/2021   Procedure: CYSTOSCOPY WITH STENT PLACEMENT;  Surgeon: Roseann Adine PARAS., MD;  Location: [facility];  Service: Urology;  Laterality: Bilateral;   EAR CYST EXCISION Left  05/04/2023   Procedure: LEFT AURICULECTOMY;  Surgeon: Carlie Clark, MD;  [facility];  Service: ENT;  Laterality: Left;   EXTRACORPOREAL SHOCK WAVE LITHOTRIPSY     EXTRACORPOREAL SHOCK WAVE LITHOTRIPSY Right 09/22/2020   Procedure: EXTRACORPOREAL SHOCK WAVE LITHOTRIPSY (ESWL);  Surgeon: Sherrilee Belvie LITTIE, MD;  Location: [facility];  Service: Urology;  Laterality: Right;  cases not starting until 10:00 due to last minute add ons, need time to get covid tests back   EXTRACORPOREAL SHOCK WAVE LITHOTRIPSY Left 10/26/2021   Procedure: EXTRACORPOREAL SHOCK WAVE LITHOTRIPSY (ESWL);  Surgeon: Roseann Adine PARAS., MD;  Location: [facility];  Service: Urology;  Laterality: Left;   INCISION AND DRAINAGE ABSCESS Right 08/12/2015   Procedure: INCISION AND DRAINAGE ABSCESS;  Surgeon: Anes Mavis, MD;  Location: [facility];  Service: General;  Laterality: Right;   KNEE ARTHROSCOPY Right    LEFT HEART CATHETERIZATION WITH CORONARY ANGIOGRAM N/A 08/14/2014   Procedure: LEFT HEART CATHETERIZATION WITH CORONARY ANGIOGRAM;  Surgeon: Debby DELENA Sor, MD;  LAD 50-60%, CFX OK, OM1 80%(small), RCA 100>>0% w/ 2.25x24 mm Synergy DES, EF 55%   POLYPECTOMY N/A 11/08/2013   Procedure: POLYPECTOMY;  Surgeon: Anes DELENA Mavis, MD;  [facility]: [facility];  Service: General;  Laterality: N/A;  cecal polyp   POLYPECTOMY  02/09/2021   Procedure: POLYPECTOMY INTESTINAL;  Surgeon:  Mavis Anes, MD;  [facility];  Service: Gastroenterology;;   QUADRICEPS TENDON REPAIR Left 10/10/2022   Procedure: OPEN REPAIR QUADRICEP TENDON;  Surgeon: Sharl Selinda Dover, MD;  Location: [facility] OR;  Service: Orthopedics;  Laterality: Left;  90   Removal of kidney stones     Open   URETEROSCOPY WITH HOLMIUM LASER LITHOTRIPSY Right 10/14/2021   Procedure: URETEROSCOPY WITH HOLMIUM LASER LITHOTRIPSY;  Surgeon: Roseann Adine PARAS., MD;  Location: [facility];  Service: Urology;  Laterality: Right;   WOUND DEBRIDEMENT Right 10/26/2015   Procedure:  DEBRIDEMENT WOUND RIGHT FLANK;  Surgeon: Anes Mavis, MD;  Location: [facility];  Service: General;  Laterality: Right;   Patient Active Problem List   Diagnosis Date Noted   Squamous cell carcinoma of external ear, left 07/14/2023   Traumatic rupture of quadriceps tendon 10/12/2022   Traumatic rupture of left quadriceps tendon 10/10/2022   Ureterolithiasis 10/15/2021   Hydronephrosis concurrent with and due to calculi of kidney and ureter 10/14/2021   Leukocytosis 10/14/2021   Left flank pain 10/14/2021   Hyperglycemia 10/14/2021   Special screening for malignant neoplasms, colon    History of colonic polyps    Adenomatous polyp of ascending colon    Diverticulosis    Nephrolithiasis 09/18/2020   Hyperkalemia 07/06/2019   AKI (acute kidney injury) on CKD stage 3a  07/05/2019   Arthritis 08/26/2014   CKD (chronic kidney disease) stage 3, GFR 30-59 ml/min (HCC) 08/22/2014   Hypertriglyceridemia 08/16/2014   Dyslipidemia - low HDL 08/16/2014   CAD (coronary artery disease), native coronary artery 08/14/2014   Former tobacco use 08/14/2014    PCP: Leta Fear, MD  REFERRING PROVIDER: Lauraine Golden, MD  REFERRING DIAG: C44.229 (ICD-10-CM) - Squamous cell carcinoma of external ear, left  THERAPY DIAG:  Abnormal posture  SCC (squamous cell carcinoma), ear, left  Rationale for Evaluation and Treatment: Rehabilitation  ONSET DATE: 04/04/23  SUBJECTIVE:     SUBJECTIVE STATEMENT: Patient reports they are here today to be seen by their medical team for newly diagnosed cancer of L ear extending to underlying cartilate.    PERTINENT HISTORY:  Locally advanced invasive to moderately differentiated keratinzing SCC of the left ear extending to the underlying ear cartilage, s/p left auriculectomy. 04/04/23 He presented to Dr. Carlie  for evaluation of two lesions located on his external left ear. Two biopsies of the left ear were collected revealing invasive SCC. 04/20/23 CT neck completed  showing nonspecific diffuse enlargement/edema of the left ear/pinna, without evidence of adjacent bone involvement, lymphadenopathy, or regional metastatic disease.    05/04/23 He opted to proceed with left auriculectomy with Dr. Carlie. Pathology from the procedure revealed: two lesions the size of 4.7 cm and 3.6 cm respectively; histology of invasive well to moderately differentiated keratinizing squamous cell carcinoma invading a depth of up to 1.4 cm, and extending to the underlying ear cartilage; left posterior central margin positive for invasive SCC (the permanent recuts of the frozen section from the central posterior margin showed a focus of invasive squamous cell carcinoma which was not identified in the frozen section slides). 07/04/23 He had some skin from the central portion of the ear excision site re-excised with pathology showing no evidence of residual carcinoma and findings consistent with a post surgical scar and sutura granuloma. He will receive 20 fractions radiation to his left ear surgical site and left neck. Treatment started 2/6 and will complete 3/5. 08/14/14- L heart catheterization, L quad tendon repair 10/10/22  PATIENT  GOALS:   to be educated about the signs and symptoms of lymphedema and learn post op HEP.   PAIN:  Are you having pain? No  PRECAUTIONS: Active CA  RED FLAGS: None   WEIGHT BEARING RESTRICTIONS: No  FALLS:  Has patient fallen in last 6 months? No Does the patient have a fear of falling that limits activity? No but is cautious  Is the patient reluctant to leave the house due to a fear of falling? No  LIVING ENVIRONMENT: Patient lives with: wife, yorkie, pit bulls Lives in: House/apartment Has following equipment at home: Single point cane, Environmental Consultant - 2 wheeled, and bed side commode  OCCUPATION: retired  LEISURE: pt walks every day around his property  PRIOR LEVEL OF FUNCTION: Independent   OBJECTIVE: Note: Objective measures were completed at  Evaluation unless otherwise noted.  COGNITION: Overall cognitive status: Within functional limits for tasks assessed                  POSTURE:  Forward head and rounded shoulders posture  30 SEC SIT TO STAND: 8 reps in 30 sec without use of UEs which is  Poor for patient's age  SHOULDER AROM:   Impaired he reports when he fell in 2024 he injured his L shoulder, and he fell in the shower prior to his knee surgery and injured his R shoulder and plans to see a doctor about this after he completes radiation   CERVICAL AROM:   Percent limited  Flexion WFL  Extension WFL  Right lateral flexion WFL  Left lateral flexion WFL  Right rotation 25% limited  Left rotation 25% limited    (Blank rows=not tested)  GAIT: Assessed: Yes Assistance needed: Modified Independent Ambulation Distance: 10 feet Assistive Device: none but usually uses a straight cane Gait pattern: Decreased hip/knee flexion bilaterally Ambulation surface: Level  PATIENT EDUCATION:  Education details: Neck ROM, importance of posture when sitting, standing and lying down, deep breathing, walking program and importance of staying active throughout treatment, CURE article on staying active, Why exercise? flyer, lymphedema and PT info Person educated: Patient Education method: Explanation, Demonstration, Handout Education comprehension: Patient verbalized understanding and returned demonstration  HOME EXERCISE PROGRAM: Patient was instructed today in a home exercise program today for head and neck range of motion exercises. These included active cervical flexion, active cervical extension, active cervical rotation to each direction, upper trap stretch, and shoulder retraction. Patient was encouraged to do these 2-3 times a day, holding for 5 sec each and completing for 5 reps. Pt was educated that once this becomes easier then hold the stretches for 30-60 seconds.    ASSESSMENT:  CLINICAL IMPRESSION: Pt arrives to PT  with recently diagnosed squamous cell carcinoma of L ear. He will receive 20 fractions radiation to his left ear surgical site and left neck. Treatment started 2/6 and will complete 3/5. Pt's cervical ROM was limited in direction of bilateral rotation. Educated pt about signs and symptoms of lymphedema as well as anatomy and physiology of lymphatic system. Educated pt in importance of staying as active as possible throughout treatment to decrease fatigue as well as head and neck ROM exercises to decrease loss of ROM. Will see pt after completion of radiation to reassess ROM and assess for lymphedema and to determine therapy needs at that time.  Pt will benefit from skilled therapeutic intervention to improve on the following deficits: Decreased knowledge of precautions and postural dysfunction.   PT treatment/interventions: ADL/self-care home management, pt/family  education, therapeutic exercise.   REHAB POTENTIAL: Good  CLINICAL DECISION MAKING: Evolving/moderate complexity  EVALUATION COMPLEXITY: Moderate   GOALS: Goals reviewed with patient? YES  LONG TERM GOALS: (STG=LTG)   Name Target Date  Goal status  1 Patient will be able to verbalize understanding of a home exercise program for cervical range of motion, posture, and walking.   Baseline:  No knowledge 07/27/2023 Achieved at eval  2 Patient will be able to verbalize understanding of proper sitting and standing posture. Baseline:  No knowledge 07/27/2023 Achieved at eval  3 Patient will be able to verbalize understanding of lymphedema risk and availability of treatment for this condition Baseline:  No knowledge 07/27/2023 Achieved at eval  4 Pt will demonstrate a return to full cervical ROM and function post operatively compared to baselines and not demonstrate any signs or symptoms of lymphedema.  Baseline: See objective measurements taken today. 09/21/23 New    PLAN:  PT FREQUENCY/DURATION: EVAL and 1 follow up appointment.   PLAN  FOR NEXT SESSION: will reassess 2 weeks after completion of radiation to determine needs.  Patient will follow up at outpatient cancer rehab 2 weeks after completion of radiation.  If the patient requires physical therapy at that time, a specific plan will be dictated and sent to the referring physician for approval. The patient was educated today on appropriate basic range of motion exercises to begin now and continue throughout radiation and educated on the signs and symptoms of lymphedema. Patient verbalized good understanding.     Physical Therapy Information for During and After Head/Neck Cancer Treatment: Lymphedema is a swelling condition that you may be at risk for in your neck and/or face if you have radiation treatment to the area and/or if you have surgery that includes removing lymph nodes.  There is treatment available for this condition and it is not life-threatening.  Contact your physician or physical therapist with concerns. An excellent resource for those seeking information on lymphedema is the National Lymphedema Network's website.  It can be accessed at www.lymphnet.org If you notice swelling in your neck or face at any time following surgery (even if it is many years from now), please contact your doctor or physical therapist to discuss this.  Lymphedema can be treated at any time but it is easier for you if it is treated early on. If you have had surgery to your neck, please check with your surgeon about how soon to start doing neck range of motion exercises.  If you are not having surgery, I encourage you to start doing neck range of motion exercises today and continue these while undergoing treatment, UNLESS you have irritation of your skin or soft tissue that is aggravated by doing them.  These exercises are intended to help you prevent loss of range of motion and/or to gain range of motion in your neck (which can be limited by tightening effects of radiation), and NOT to  aggravate these tissues if they develop sensitivities from treatment. Neck range of motion exercises should be done to the point of feeling a GENTLE, TOLERABLE stretch only.  You are encouraged to start a walking or other exercise program tomorrow and continue this as much as you are able through and after treatment.  Please feel free to call me with any questions. Florina Lanis Carbon, PT, CLT Physical Therapist and Certified Lymphedema Therapist [facility] [address], [address], [place], [place] [address] [phone] [email or web address]  WALKING  Walking is a great  form of exercise to increase your strength, endurance and overall fitness.  A walking program can help you start slowly and gradually build endurance as you go.  Everyone's ability is different, so each person's starting point will be different.  You do not have to follow them exactly.  The are just samples. You should simply find out what's right for you and stick to that program.   In the beginning, you'll start off walking 2-3 times a day for short distances.  As you get stronger, you'll be walking further at just 1-2 times per day.  A. You Can Walk For A Certain Length Of Time Each Day    Walk 5 minutes 3 times per day.  Increase 2 minutes every 2 days (3 times per day).  Work up to 25-30 minutes (1-2 times per day).   Example:   Day 1-2 5 minutes 3 times per day   Day 7-8 12 minutes 2-3 times per day   Day 13-14 25 minutes 1-2 times per day  B. You Can Walk For a Certain Distance Each Day     Distance can be substituted for time.    Example:   3 trips to mailbox (at road)   3 trips to corner of block   3 trips around the block  C. Go to local high school and use the track.    Walk for distance ____ around track  Or time ____ minutes  D. Walk ____ Jog ____ Run ___   Why exercise?  So many benefits! Here are SOME of them: Heart health, including raising your good  cholesterol level and reducing heart rate and blood pressure Lung health, including improved lung capacity It burns fats, and most of us  can stand to be leaner, whether or not we are overweight. It increases the body's natural painkillers and mood elevators, so makes you feel better. Not only makes you feel better, but look better too Improves sleep Takes a bite out of stress May decrease your risk of many types of cancer If you are currently undergoing cancer treatment, exercise may improve your ability to tolerate treatments including chemotherapy. For everybody, it can improve your energy level. Those with cancer-related fatigue report a 40-50% reduction in this symptom when exercising regularly. If you are a survivor of breast, colon, or prostate cancer, it may decrease your risk of a recurrence. (This may hold for other cancers too, but so far we have data just for these three types.)  How to exercise: Get your doctor's okay. Pick something you enjoy doing, like walking, Zumba, biking, swimming, or whatever. Start at low intensity and time, then gradually increase.  (See walking program handout.) Set a goal to achieve over time.  The American Cancer Society, American Heart Association, and U.S. Dept. of Health and Human Services recommend 150 minutes of moderate exercise, 75 minutes of vigorous exercise, or a combination of both per week. This should be done in episodes at least 10 minutes long, spread throughout the week.  Need help being motivated? Pick something you enjoy doing, because you'll be more inclined to stick with that activity than something that feels like a chore. Do it with a friend so that you are accountable to each other. Schedule it into your day. Place it on your calendar and keep that appointment just like you do any appointment that you make. Join an exercise group that meets at a specific time.  That way, you have to show up on time,  and that makes it harder to  procrastinate about doing your workout.  It also keeps you accountable--people begin to expect you to be there. Join a gym where you feel comfortable and not intimidated, at the right cost. Sign up for something that you'll need to be in shape for on a specific date, like a 1K or a 5K to walk or run, a 20 or 30 mile bike ride, a mud run or something like that. If the date is looming, you know you'll need to train to be ready for it.  An added benefit is that many of these are fundraisers for good causes. If you've already paid for a gym membership, group exercise class or event, you might as well work out, so you haven't wasted your money!    [facility], PT 07/27/2023, 11:42 AM

## 2023-07-27 NOTE — Progress Notes (Signed)
 Oncology Nurse Navigator Documentation   To provide support, encouragement and care continuity, met with Charles Brooks for his initial RT.   I reviewed the 2-step treatment process, answered questions.  Charles Brooks completed treatment without difficulty, denied questions/concerns. I reviewed the registration/arrival procedure for subsequent treatments. I encouraged them to call me with questions/concerns as treatments proceed.  He also attended [facility] to see Physical Therapy.    Charles Jefferson RN, BSN, OCN Head & Neck Oncology Nurse Navigator [facility] Phone # [phone]  Fax # [phone]

## 2023-07-28 ENCOUNTER — Other Ambulatory Visit: Payer: Self-pay

## 2023-07-28 ENCOUNTER — Ambulatory Visit
Admission: RE | Admit: 2023-07-28 | Discharge: 2023-07-28 | Disposition: A | Discharge status: Home / Self Care | Payer: PPO | Source: Ambulatory Visit | Attending: Radiation Oncology | Admitting: Radiation Oncology

## 2023-07-28 DIAGNOSIS — Z510 Encounter for antineoplastic radiation therapy: Secondary | ICD-10-CM | POA: Diagnosis not present

## 2023-07-28 DIAGNOSIS — C44229 Squamous cell carcinoma of skin of left ear and external auricular canal: Secondary | ICD-10-CM | POA: Diagnosis not present

## 2023-07-28 LAB — RAD ONC ARIA SESSION SUMMARY
Course Elapsed Days: 1
Plan Fractions Treated to Date: 2
Plan Prescribed Dose Per Fraction: 2.5 Gy
Plan Total Fractions Prescribed: 20
Plan Total Prescribed Dose: 50 Gy
Reference Point Dosage Given to Date: 5 Gy
Reference Point Session Dosage Given: 2.5 Gy
Session Number: 2

## 2023-07-31 ENCOUNTER — Ambulatory Visit
Admission: RE | Admit: 2023-07-31 | Discharge: 2023-07-31 | Disposition: A | Discharge status: Home / Self Care | Payer: PPO | Source: Ambulatory Visit | Attending: Radiation Oncology | Admitting: Radiation Oncology

## 2023-07-31 ENCOUNTER — Other Ambulatory Visit: Payer: Self-pay

## 2023-07-31 DIAGNOSIS — C44229 Squamous cell carcinoma of skin of left ear and external auricular canal: Secondary | ICD-10-CM | POA: Diagnosis not present

## 2023-07-31 DIAGNOSIS — Z510 Encounter for antineoplastic radiation therapy: Secondary | ICD-10-CM | POA: Diagnosis not present

## 2023-07-31 LAB — RAD ONC ARIA SESSION SUMMARY
Course Elapsed Days: 4
Plan Fractions Treated to Date: 3
Plan Prescribed Dose Per Fraction: 2.5 Gy
Plan Total Fractions Prescribed: 20
Plan Total Prescribed Dose: 50 Gy
Reference Point Dosage Given to Date: 7.5 Gy
Reference Point Session Dosage Given: 2.5 Gy
Session Number: 3

## 2023-08-01 ENCOUNTER — Ambulatory Visit
Admission: RE | Admit: 2023-08-01 | Discharge: 2023-08-01 | Disposition: A | Discharge status: Home / Self Care | Payer: PPO | Source: Ambulatory Visit | Attending: Radiation Oncology | Admitting: Radiation Oncology

## 2023-08-01 ENCOUNTER — Other Ambulatory Visit: Payer: Self-pay

## 2023-08-01 ENCOUNTER — Telehealth: Payer: Self-pay | Admitting: Radiation Oncology

## 2023-08-01 ENCOUNTER — Ambulatory Visit: Payer: HMO | Admitting: Nutrition

## 2023-08-01 DIAGNOSIS — Z510 Encounter for antineoplastic radiation therapy: Secondary | ICD-10-CM | POA: Diagnosis not present

## 2023-08-01 DIAGNOSIS — C44229 Squamous cell carcinoma of skin of left ear and external auricular canal: Secondary | ICD-10-CM | POA: Diagnosis not present

## 2023-08-01 LAB — RAD ONC ARIA SESSION SUMMARY
Course Elapsed Days: 5
Plan Fractions Treated to Date: 4
Plan Prescribed Dose Per Fraction: 2.5 Gy
Plan Total Fractions Prescribed: 20
Plan Total Prescribed Dose: 50 Gy
Reference Point Dosage Given to Date: 10 Gy
Reference Point Session Dosage Given: 2.5 Gy
Session Number: 4

## 2023-08-01 NOTE — Telephone Encounter (Signed)
Called to inform patient of scheduled appointment with Dermatologist Dr. Margo Aye at [place] location. No answer, LVM with appointment date and time. Advised patient to contact our office or Dr. Scharlene Gloss office with any questions.

## 2023-08-01 NOTE — Progress Notes (Signed)
73 year old male diagnosed with left ear squamous cell carcinoma.  He is followed by Dr. Basilio Cairo and will receive 4 weeks of radiation therapy.  Final treatment is scheduled for March 6.  Past medical history includes CAD, cellulitis, chronic kidney disease stage III, essential hypertension, hyperlipidemia, STEMI.  Medications include vitamin D3, Colace, Synthroid, vitamin B12, and zinc.  Labs reviewed.  Height: 6 feet 0 inches. 189.4 pounds February 11. Usual body weight: 190 pounds. BMI: 25.69.  Patient denies decreased appetite or oral intake.  He is aware he may develop some thickened saliva.  [place].  He currently eats what ever he is hungry for.  He seems to enjoy a good variety of protein foods.  He only consumes water, Gatorade, and soda.  He mentions he noticed a metallic taste.  Nutrition diagnosis: Food and nutrition related knowledge deficit related to cancer and associated treatments as evidenced by no prior need for nutrition related information.  Intervention: Educated patient to consume small frequent meals and snacks with increased protein.   Discouraged weight loss during treatment. I reviewed high-protein foods and provided nutrition fact sheet. Encouraged increased fluids. Educated on strategies for improving taste alterations and dry mouth.  Provided nutrition facts sheet. Provided contact information and encouraged patient to contact RD with questions.  Monitoring, evaluation, goals: Patient will tolerate adequate calories and protein to minimize weight loss throughout treatment.  Next visit to be scheduled as needed.  **Disclaimer: This note was dictated with voice recognition software. Similar sounding words can inadvertently be transcribed and this note may contain transcription errors which may not have been corrected upon publication of note.**

## 2023-08-02 ENCOUNTER — Ambulatory Visit: Payer: PPO | Admitting: Radiation Oncology

## 2023-08-03 ENCOUNTER — Other Ambulatory Visit: Payer: Self-pay

## 2023-08-03 ENCOUNTER — Ambulatory Visit
Admission: RE | Admit: 2023-08-03 | Discharge: 2023-08-03 | Disposition: A | Discharge status: Home / Self Care | Payer: PPO | Source: Ambulatory Visit | Attending: Radiation Oncology | Admitting: Radiation Oncology

## 2023-08-03 DIAGNOSIS — Z510 Encounter for antineoplastic radiation therapy: Secondary | ICD-10-CM | POA: Diagnosis not present

## 2023-08-03 DIAGNOSIS — C44229 Squamous cell carcinoma of skin of left ear and external auricular canal: Secondary | ICD-10-CM | POA: Diagnosis not present

## 2023-08-03 LAB — RAD ONC ARIA SESSION SUMMARY
Course Elapsed Days: 7
Plan Fractions Treated to Date: 5
Plan Prescribed Dose Per Fraction: 2.5 Gy
Plan Total Fractions Prescribed: 20
Plan Total Prescribed Dose: 50 Gy
Reference Point Dosage Given to Date: 12.5 Gy
Reference Point Session Dosage Given: 2.5 Gy
Session Number: 5

## 2023-08-04 ENCOUNTER — Ambulatory Visit
Admission: RE | Admit: 2023-08-04 | Discharge: 2023-08-04 | Disposition: A | Discharge status: Home / Self Care | Payer: PPO | Source: Ambulatory Visit | Attending: Radiation Oncology | Admitting: Radiation Oncology

## 2023-08-04 ENCOUNTER — Other Ambulatory Visit: Payer: Self-pay

## 2023-08-04 DIAGNOSIS — Z510 Encounter for antineoplastic radiation therapy: Secondary | ICD-10-CM | POA: Diagnosis not present

## 2023-08-04 DIAGNOSIS — C44229 Squamous cell carcinoma of skin of left ear and external auricular canal: Secondary | ICD-10-CM | POA: Diagnosis not present

## 2023-08-04 LAB — RAD ONC ARIA SESSION SUMMARY
Course Elapsed Days: 8
Plan Fractions Treated to Date: 6
Plan Prescribed Dose Per Fraction: 2.5 Gy
Plan Total Fractions Prescribed: 20
Plan Total Prescribed Dose: 50 Gy
Reference Point Dosage Given to Date: 15 Gy
Reference Point Session Dosage Given: 2.5 Gy
Session Number: 6

## 2023-08-07 ENCOUNTER — Other Ambulatory Visit: Payer: Self-pay

## 2023-08-07 ENCOUNTER — Ambulatory Visit
Admission: RE | Admit: 2023-08-07 | Discharge: 2023-08-07 | Disposition: A | Discharge status: Home / Self Care | Payer: PPO | Source: Ambulatory Visit | Attending: Radiation Oncology | Admitting: Radiation Oncology

## 2023-08-07 DIAGNOSIS — C44229 Squamous cell carcinoma of skin of left ear and external auricular canal: Secondary | ICD-10-CM | POA: Diagnosis not present

## 2023-08-07 DIAGNOSIS — Z510 Encounter for antineoplastic radiation therapy: Secondary | ICD-10-CM | POA: Diagnosis not present

## 2023-08-07 LAB — RAD ONC ARIA SESSION SUMMARY
Course Elapsed Days: 11
Plan Fractions Treated to Date: 7
Plan Prescribed Dose Per Fraction: 2.5 Gy
Plan Total Fractions Prescribed: 20
Plan Total Prescribed Dose: 50 Gy
Reference Point Dosage Given to Date: 17.5 Gy
Reference Point Session Dosage Given: 2.5 Gy
Session Number: 7

## 2023-08-08 ENCOUNTER — Other Ambulatory Visit: Payer: Self-pay

## 2023-08-08 ENCOUNTER — Ambulatory Visit
Admission: RE | Admit: 2023-08-08 | Discharge: 2023-08-08 | Disposition: A | Discharge status: Home / Self Care | Payer: PPO | Source: Ambulatory Visit | Attending: Radiation Oncology | Admitting: Radiation Oncology

## 2023-08-08 DIAGNOSIS — C44229 Squamous cell carcinoma of skin of left ear and external auricular canal: Secondary | ICD-10-CM | POA: Diagnosis not present

## 2023-08-08 DIAGNOSIS — Z510 Encounter for antineoplastic radiation therapy: Secondary | ICD-10-CM | POA: Diagnosis not present

## 2023-08-08 LAB — RAD ONC ARIA SESSION SUMMARY
Course Elapsed Days: 12
Plan Fractions Treated to Date: 8
Plan Prescribed Dose Per Fraction: 2.5 Gy
Plan Total Fractions Prescribed: 20
Plan Total Prescribed Dose: 50 Gy
Reference Point Dosage Given to Date: 20 Gy
Reference Point Session Dosage Given: 2.5 Gy
Session Number: 8

## 2023-08-09 ENCOUNTER — Ambulatory Visit
Admission: RE | Admit: 2023-08-09 | Discharge: 2023-08-09 | Disposition: A | Discharge status: Home / Self Care | Payer: PPO | Source: Ambulatory Visit | Attending: Radiation Oncology | Admitting: Radiation Oncology

## 2023-08-09 ENCOUNTER — Other Ambulatory Visit: Payer: Self-pay

## 2023-08-09 DIAGNOSIS — C44229 Squamous cell carcinoma of skin of left ear and external auricular canal: Secondary | ICD-10-CM | POA: Diagnosis not present

## 2023-08-09 DIAGNOSIS — Z510 Encounter for antineoplastic radiation therapy: Secondary | ICD-10-CM | POA: Diagnosis not present

## 2023-08-09 LAB — RAD ONC ARIA SESSION SUMMARY
Course Elapsed Days: 13
Plan Fractions Treated to Date: 9
Plan Prescribed Dose Per Fraction: 2.5 Gy
Plan Total Fractions Prescribed: 20
Plan Total Prescribed Dose: 50 Gy
Reference Point Dosage Given to Date: 22.5 Gy
Reference Point Session Dosage Given: 2.5 Gy
Session Number: 9

## 2023-08-10 ENCOUNTER — Ambulatory Visit: Payer: PPO | Admitting: Radiation Oncology

## 2023-08-11 ENCOUNTER — Other Ambulatory Visit: Payer: Self-pay

## 2023-08-11 ENCOUNTER — Ambulatory Visit
Admission: RE | Admit: 2023-08-11 | Discharge: 2023-08-11 | Disposition: A | Discharge status: Home / Self Care | Payer: PPO | Source: Ambulatory Visit | Attending: Radiation Oncology | Admitting: Radiation Oncology

## 2023-08-11 DIAGNOSIS — C44229 Squamous cell carcinoma of skin of left ear and external auricular canal: Secondary | ICD-10-CM | POA: Diagnosis not present

## 2023-08-11 DIAGNOSIS — Z510 Encounter for antineoplastic radiation therapy: Secondary | ICD-10-CM | POA: Diagnosis not present

## 2023-08-11 LAB — RAD ONC ARIA SESSION SUMMARY
Course Elapsed Days: 15
Plan Fractions Treated to Date: 10
Plan Prescribed Dose Per Fraction: 2.5 Gy
Plan Total Fractions Prescribed: 20
Plan Total Prescribed Dose: 50 Gy
Reference Point Dosage Given to Date: 25 Gy
Reference Point Session Dosage Given: 2.5 Gy
Session Number: 10

## 2023-08-14 ENCOUNTER — Ambulatory Visit: Payer: PPO

## 2023-08-14 ENCOUNTER — Ambulatory Visit
Admission: RE | Admit: 2023-08-14 | Discharge: 2023-08-14 | Disposition: A | Discharge status: Home / Self Care | Payer: PPO | Source: Ambulatory Visit | Attending: Radiation Oncology | Admitting: Radiation Oncology

## 2023-08-14 ENCOUNTER — Other Ambulatory Visit: Payer: Self-pay

## 2023-08-14 DIAGNOSIS — Z510 Encounter for antineoplastic radiation therapy: Secondary | ICD-10-CM | POA: Diagnosis not present

## 2023-08-14 DIAGNOSIS — C44229 Squamous cell carcinoma of skin of left ear and external auricular canal: Secondary | ICD-10-CM | POA: Diagnosis not present

## 2023-08-14 LAB — RAD ONC ARIA SESSION SUMMARY
Course Elapsed Days: 18
Plan Fractions Treated to Date: 11
Plan Prescribed Dose Per Fraction: 2.5 Gy
Plan Total Fractions Prescribed: 20
Plan Total Prescribed Dose: 50 Gy
Reference Point Dosage Given to Date: 27.5 Gy
Reference Point Session Dosage Given: 2.5 Gy
Session Number: 11

## 2023-08-15 ENCOUNTER — Other Ambulatory Visit: Payer: Self-pay

## 2023-08-15 ENCOUNTER — Ambulatory Visit
Admission: RE | Admit: 2023-08-15 | Discharge: 2023-08-15 | Disposition: A | Discharge status: Home / Self Care | Payer: PPO | Source: Ambulatory Visit | Attending: Radiation Oncology | Admitting: Radiation Oncology

## 2023-08-15 DIAGNOSIS — C44229 Squamous cell carcinoma of skin of left ear and external auricular canal: Secondary | ICD-10-CM | POA: Diagnosis not present

## 2023-08-15 DIAGNOSIS — Z510 Encounter for antineoplastic radiation therapy: Secondary | ICD-10-CM | POA: Diagnosis not present

## 2023-08-15 LAB — RAD ONC ARIA SESSION SUMMARY
Course Elapsed Days: 19
Plan Fractions Treated to Date: 12
Plan Prescribed Dose Per Fraction: 2.5 Gy
Plan Total Fractions Prescribed: 20
Plan Total Prescribed Dose: 50 Gy
Reference Point Dosage Given to Date: 30 Gy
Reference Point Session Dosage Given: 2.5 Gy
Session Number: 12

## 2023-08-16 ENCOUNTER — Other Ambulatory Visit: Payer: Self-pay

## 2023-08-16 ENCOUNTER — Ambulatory Visit
Admission: RE | Admit: 2023-08-16 | Discharge: 2023-08-16 | Disposition: A | Discharge status: Home / Self Care | Payer: PPO | Source: Ambulatory Visit | Attending: Radiation Oncology | Admitting: Radiation Oncology

## 2023-08-16 DIAGNOSIS — C44229 Squamous cell carcinoma of skin of left ear and external auricular canal: Secondary | ICD-10-CM | POA: Diagnosis not present

## 2023-08-16 DIAGNOSIS — Z510 Encounter for antineoplastic radiation therapy: Secondary | ICD-10-CM | POA: Diagnosis not present

## 2023-08-16 LAB — RAD ONC ARIA SESSION SUMMARY
Course Elapsed Days: 20
Plan Fractions Treated to Date: 13
Plan Prescribed Dose Per Fraction: 2.5 Gy
Plan Total Fractions Prescribed: 20
Plan Total Prescribed Dose: 50 Gy
Reference Point Dosage Given to Date: 32.5 Gy
Reference Point Session Dosage Given: 2.5 Gy
Session Number: 13

## 2023-08-17 ENCOUNTER — Other Ambulatory Visit: Payer: Self-pay

## 2023-08-17 ENCOUNTER — Ambulatory Visit
Admission: RE | Admit: 2023-08-17 | Discharge: 2023-08-17 | Disposition: A | Discharge status: Home / Self Care | Payer: PPO | Source: Ambulatory Visit | Attending: Radiation Oncology | Admitting: Radiation Oncology

## 2023-08-17 DIAGNOSIS — C44229 Squamous cell carcinoma of skin of left ear and external auricular canal: Secondary | ICD-10-CM | POA: Diagnosis not present

## 2023-08-17 DIAGNOSIS — Z510 Encounter for antineoplastic radiation therapy: Secondary | ICD-10-CM | POA: Diagnosis not present

## 2023-08-17 LAB — RAD ONC ARIA SESSION SUMMARY
Course Elapsed Days: 21
Plan Fractions Treated to Date: 14
Plan Prescribed Dose Per Fraction: 2.5 Gy
Plan Total Fractions Prescribed: 20
Plan Total Prescribed Dose: 50 Gy
Reference Point Dosage Given to Date: 35 Gy
Reference Point Session Dosage Given: 2.5 Gy
Session Number: 14

## 2023-08-18 ENCOUNTER — Other Ambulatory Visit: Payer: Self-pay

## 2023-08-18 ENCOUNTER — Ambulatory Visit
Admission: RE | Admit: 2023-08-18 | Discharge: 2023-08-18 | Disposition: A | Discharge status: Home / Self Care | Payer: PPO | Source: Ambulatory Visit | Attending: Radiation Oncology | Admitting: Radiation Oncology

## 2023-08-18 DIAGNOSIS — C44229 Squamous cell carcinoma of skin of left ear and external auricular canal: Secondary | ICD-10-CM | POA: Diagnosis not present

## 2023-08-18 DIAGNOSIS — Z510 Encounter for antineoplastic radiation therapy: Secondary | ICD-10-CM | POA: Diagnosis not present

## 2023-08-18 LAB — RAD ONC ARIA SESSION SUMMARY
Course Elapsed Days: 22
Plan Fractions Treated to Date: 15
Plan Prescribed Dose Per Fraction: 2.5 Gy
Plan Total Fractions Prescribed: 20
Plan Total Prescribed Dose: 50 Gy
Reference Point Dosage Given to Date: 37.5 Gy
Reference Point Session Dosage Given: 2.5 Gy
Session Number: 15

## 2023-08-21 ENCOUNTER — Ambulatory Visit
Admission: RE | Admit: 2023-08-21 | Discharge: 2023-08-21 | Disposition: A | Discharge status: Home / Self Care | Payer: PPO | Source: Ambulatory Visit | Attending: Radiation Oncology | Admitting: Radiation Oncology

## 2023-08-21 ENCOUNTER — Other Ambulatory Visit: Payer: Self-pay

## 2023-08-21 ENCOUNTER — Other Ambulatory Visit: Payer: Self-pay | Admitting: Radiation Oncology

## 2023-08-21 DIAGNOSIS — Z510 Encounter for antineoplastic radiation therapy: Secondary | ICD-10-CM | POA: Insufficient documentation

## 2023-08-21 DIAGNOSIS — C44229 Squamous cell carcinoma of skin of left ear and external auricular canal: Secondary | ICD-10-CM | POA: Insufficient documentation

## 2023-08-21 LAB — RAD ONC ARIA SESSION SUMMARY
Course Elapsed Days: 25
Plan Fractions Treated to Date: 16
Plan Prescribed Dose Per Fraction: 2.5 Gy
Plan Total Fractions Prescribed: 20
Plan Total Prescribed Dose: 50 Gy
Reference Point Dosage Given to Date: 40 Gy
Reference Point Session Dosage Given: 2.5 Gy
Session Number: 16

## 2023-08-21 MED ORDER — LIDOCAINE VISCOUS HCL 2 % MT SOLN: OROMUCOSAL | 2 refills | Status: DC

## 2023-08-22 ENCOUNTER — Ambulatory Visit
Admission: RE | Admit: 2023-08-22 | Discharge: 2023-08-22 | Disposition: A | Discharge status: Home / Self Care | Payer: PPO | Source: Ambulatory Visit | Attending: Radiation Oncology | Admitting: Radiation Oncology

## 2023-08-22 ENCOUNTER — Other Ambulatory Visit: Payer: Self-pay

## 2023-08-22 DIAGNOSIS — Z510 Encounter for antineoplastic radiation therapy: Secondary | ICD-10-CM | POA: Diagnosis not present

## 2023-08-22 DIAGNOSIS — C44229 Squamous cell carcinoma of skin of left ear and external auricular canal: Secondary | ICD-10-CM | POA: Diagnosis not present

## 2023-08-22 LAB — RAD ONC ARIA SESSION SUMMARY
Course Elapsed Days: 26
Plan Fractions Treated to Date: 17
Plan Prescribed Dose Per Fraction: 2.5 Gy
Plan Total Fractions Prescribed: 20
Plan Total Prescribed Dose: 50 Gy
Reference Point Dosage Given to Date: 42.5 Gy
Reference Point Session Dosage Given: 2.5 Gy
Session Number: 17

## 2023-08-23 ENCOUNTER — Other Ambulatory Visit: Payer: Self-pay

## 2023-08-23 ENCOUNTER — Ambulatory Visit
Admission: RE | Admit: 2023-08-23 | Discharge: 2023-08-23 | Disposition: A | Discharge status: Home / Self Care | Payer: PPO | Source: Ambulatory Visit | Attending: Radiation Oncology | Admitting: Radiation Oncology

## 2023-08-23 DIAGNOSIS — Z510 Encounter for antineoplastic radiation therapy: Secondary | ICD-10-CM | POA: Diagnosis not present

## 2023-08-23 DIAGNOSIS — N179 Acute kidney failure, unspecified: Secondary | ICD-10-CM | POA: Diagnosis not present

## 2023-08-23 DIAGNOSIS — E860 Dehydration: Secondary | ICD-10-CM | POA: Diagnosis not present

## 2023-08-23 DIAGNOSIS — C44229 Squamous cell carcinoma of skin of left ear and external auricular canal: Secondary | ICD-10-CM | POA: Diagnosis not present

## 2023-08-23 LAB — RAD ONC ARIA SESSION SUMMARY
Course Elapsed Days: 27
Plan Fractions Treated to Date: 18
Plan Prescribed Dose Per Fraction: 2.5 Gy
Plan Total Fractions Prescribed: 20
Plan Total Prescribed Dose: 50 Gy
Reference Point Dosage Given to Date: 45 Gy
Reference Point Session Dosage Given: 2.5 Gy
Session Number: 18

## 2023-08-24 ENCOUNTER — Ambulatory Visit
Admission: RE | Admit: 2023-08-24 | Discharge: 2023-08-24 | Disposition: A | Discharge status: Home / Self Care | Payer: PPO | Source: Ambulatory Visit | Attending: Radiation Oncology | Admitting: Radiation Oncology

## 2023-08-24 ENCOUNTER — Ambulatory Visit: Payer: PPO | Admitting: Radiation Oncology

## 2023-08-24 ENCOUNTER — Other Ambulatory Visit: Payer: Self-pay

## 2023-08-24 DIAGNOSIS — C44229 Squamous cell carcinoma of skin of left ear and external auricular canal: Secondary | ICD-10-CM | POA: Diagnosis not present

## 2023-08-24 DIAGNOSIS — Z510 Encounter for antineoplastic radiation therapy: Secondary | ICD-10-CM | POA: Diagnosis not present

## 2023-08-24 LAB — RAD ONC ARIA SESSION SUMMARY
Course Elapsed Days: 28
Plan Fractions Treated to Date: 19
Plan Prescribed Dose Per Fraction: 2.5 Gy
Plan Total Fractions Prescribed: 20
Plan Total Prescribed Dose: 50 Gy
Reference Point Dosage Given to Date: 47.5 Gy
Reference Point Session Dosage Given: 2.5 Gy
Session Number: 19

## 2023-08-25 ENCOUNTER — Ambulatory Visit
Admission: RE | Admit: 2023-08-25 | Discharge: 2023-08-25 | Disposition: A | Discharge status: Home / Self Care | Payer: PPO | Source: Ambulatory Visit | Attending: Radiation Oncology | Admitting: Radiation Oncology

## 2023-08-25 ENCOUNTER — Other Ambulatory Visit: Payer: Self-pay

## 2023-08-25 DIAGNOSIS — C44229 Squamous cell carcinoma of skin of left ear and external auricular canal: Secondary | ICD-10-CM | POA: Diagnosis not present

## 2023-08-25 DIAGNOSIS — Z510 Encounter for antineoplastic radiation therapy: Secondary | ICD-10-CM | POA: Diagnosis not present

## 2023-08-25 LAB — RAD ONC ARIA SESSION SUMMARY
Course Elapsed Days: 29
Plan Fractions Treated to Date: 20
Plan Prescribed Dose Per Fraction: 2.5 Gy
Plan Total Fractions Prescribed: 20
Plan Total Prescribed Dose: 50 Gy
Reference Point Dosage Given to Date: 50 Gy
Reference Point Session Dosage Given: 2.5 Gy
Session Number: 20

## 2023-08-26 ENCOUNTER — Encounter (HOSPITAL_COMMUNITY): Payer: Self-pay

## 2023-08-26 ENCOUNTER — Inpatient Hospital Stay (HOSPITAL_COMMUNITY)
Admission: EM | Admit: 2023-08-26 | Discharge: 2023-08-30 | DRG: 683 | Disposition: A | Discharge status: Home / Self Care | Attending: Internal Medicine | Admitting: Internal Medicine

## 2023-08-26 ENCOUNTER — Other Ambulatory Visit: Payer: Self-pay

## 2023-08-26 DIAGNOSIS — Y842 Radiological procedure and radiotherapy as the cause of abnormal reaction of the patient, or of later complication, without mention of misadventure at the time of the procedure: Secondary | ICD-10-CM | POA: Diagnosis present

## 2023-08-26 DIAGNOSIS — E860 Dehydration: Secondary | ICD-10-CM | POA: Diagnosis not present

## 2023-08-26 DIAGNOSIS — N390 Urinary tract infection, site not specified: Secondary | ICD-10-CM | POA: Diagnosis not present

## 2023-08-26 DIAGNOSIS — I252 Old myocardial infarction: Secondary | ICD-10-CM

## 2023-08-26 DIAGNOSIS — C44229 Squamous cell carcinoma of skin of left ear and external auricular canal: Secondary | ICD-10-CM | POA: Diagnosis present

## 2023-08-26 DIAGNOSIS — N189 Chronic kidney disease, unspecified: Secondary | ICD-10-CM | POA: Diagnosis not present

## 2023-08-26 DIAGNOSIS — Z7982 Long term (current) use of aspirin: Secondary | ICD-10-CM | POA: Diagnosis not present

## 2023-08-26 DIAGNOSIS — I2510 Atherosclerotic heart disease of native coronary artery without angina pectoris: Secondary | ICD-10-CM | POA: Diagnosis present

## 2023-08-26 DIAGNOSIS — E785 Hyperlipidemia, unspecified: Secondary | ICD-10-CM | POA: Diagnosis present

## 2023-08-26 DIAGNOSIS — E039 Hypothyroidism, unspecified: Secondary | ICD-10-CM | POA: Diagnosis present

## 2023-08-26 DIAGNOSIS — B370 Candidal stomatitis: Secondary | ICD-10-CM | POA: Diagnosis not present

## 2023-08-26 DIAGNOSIS — N3000 Acute cystitis without hematuria: Secondary | ICD-10-CM | POA: Diagnosis not present

## 2023-08-26 DIAGNOSIS — K1230 Oral mucositis (ulcerative), unspecified: Secondary | ICD-10-CM

## 2023-08-26 DIAGNOSIS — I129 Hypertensive chronic kidney disease with stage 1 through stage 4 chronic kidney disease, or unspecified chronic kidney disease: Secondary | ICD-10-CM | POA: Diagnosis present

## 2023-08-26 DIAGNOSIS — K1233 Oral mucositis (ulcerative) due to radiation: Secondary | ICD-10-CM | POA: Diagnosis present

## 2023-08-26 DIAGNOSIS — G629 Polyneuropathy, unspecified: Secondary | ICD-10-CM | POA: Diagnosis not present

## 2023-08-26 DIAGNOSIS — N1832 Chronic kidney disease, stage 3b: Secondary | ICD-10-CM | POA: Diagnosis present

## 2023-08-26 DIAGNOSIS — Z8249 Family history of ischemic heart disease and other diseases of the circulatory system: Secondary | ICD-10-CM

## 2023-08-26 DIAGNOSIS — B964 Proteus (mirabilis) (morganii) as the cause of diseases classified elsewhere: Secondary | ICD-10-CM | POA: Diagnosis not present

## 2023-08-26 DIAGNOSIS — Z79899 Other long term (current) drug therapy: Secondary | ICD-10-CM

## 2023-08-26 DIAGNOSIS — N179 Acute kidney failure, unspecified: Principal | ICD-10-CM | POA: Diagnosis present

## 2023-08-26 DIAGNOSIS — N183 Chronic kidney disease, stage 3 unspecified: Secondary | ICD-10-CM | POA: Diagnosis present

## 2023-08-26 DIAGNOSIS — Z923 Personal history of irradiation: Secondary | ICD-10-CM | POA: Diagnosis not present

## 2023-08-26 DIAGNOSIS — Z79890 Hormone replacement therapy (postmenopausal): Secondary | ICD-10-CM | POA: Diagnosis not present

## 2023-08-26 DIAGNOSIS — Z955 Presence of coronary angioplasty implant and graft: Secondary | ICD-10-CM

## 2023-08-26 LAB — BASIC METABOLIC PANEL
Anion gap: 16 — ABNORMAL HIGH (ref 5–15)
BUN: 52 mg/dL — ABNORMAL HIGH (ref 8–23)
CO2: 21 mmol/L — ABNORMAL LOW (ref 22–32)
Calcium: 9.6 mg/dL (ref 8.9–10.3)
Chloride: 100 mmol/L (ref 98–111)
Creatinine, Ser: 4.69 mg/dL — ABNORMAL HIGH (ref 0.61–1.24)
GFR, Estimated: 12 mL/min — ABNORMAL LOW (ref >60)
Glucose, Bld: 104 mg/dL — ABNORMAL HIGH (ref 70–99)
Potassium: 5 mmol/L (ref 3.5–5.1)
Sodium: 137 mmol/L (ref 135–145)

## 2023-08-26 LAB — URINALYSIS, ROUTINE W REFLEX MICROSCOPIC
Bilirubin Urine: NEGATIVE
Glucose, UA: NEGATIVE mg/dL
Ketones, ur: NEGATIVE mg/dL
Nitrite: NEGATIVE
Protein, ur: 100 mg/dL — AB
Specific Gravity, Urine: 1.015 (ref 1.005–1.030)
WBC, UA: >50 WBC/hpf (ref 0–5)
pH: 6 (ref 5.0–8.0)

## 2023-08-26 LAB — CBC WITH DIFFERENTIAL/PLATELET
Abs Immature Granulocytes: 0.09 10*3/uL — ABNORMAL HIGH (ref 0.00–0.07)
Basophils Absolute: 0 10*3/uL (ref 0.0–0.1)
Basophils Relative: 0 %
Eosinophils Absolute: 0 10*3/uL (ref 0.0–0.5)
Eosinophils Relative: 0 %
HCT: 36.9 % — ABNORMAL LOW (ref 39.0–52.0)
Hemoglobin: 11.5 g/dL — ABNORMAL LOW (ref 13.0–17.0)
Immature Granulocytes: 1 %
Lymphocytes Relative: 3 %
Lymphs Abs: 0.4 10*3/uL — ABNORMAL LOW (ref 0.7–4.0)
MCH: 31.3 pg (ref 26.0–34.0)
MCHC: 31.2 g/dL (ref 30.0–36.0)
MCV: 100.3 fL — ABNORMAL HIGH (ref 80.0–100.0)
Monocytes Absolute: 0.6 10*3/uL (ref 0.1–1.0)
Monocytes Relative: 5 %
Neutro Abs: 11.4 10*3/uL — ABNORMAL HIGH (ref 1.7–7.7)
Neutrophils Relative %: 91 %
Platelets: 191 10*3/uL (ref 150–400)
RBC: 3.68 MIL/uL — ABNORMAL LOW (ref 4.22–5.81)
RDW: 13.7 % (ref 11.5–15.5)
WBC: 12.5 10*3/uL — ABNORMAL HIGH (ref 4.0–10.5)
nRBC: 0 % (ref 0.0–0.2)

## 2023-08-26 MED ORDER — ONDANSETRON HCL 4 MG PO TABS: 4.0000 mg | ORAL_TABLET | Freq: Four times a day (QID) | ORAL | Status: DC | PRN

## 2023-08-26 MED ORDER — LACTATED RINGERS IV SOLN: INTRAVENOUS | Status: DC

## 2023-08-26 MED ORDER — ACETAMINOPHEN 325 MG PO TABS
650.0000 mg | ORAL_TABLET | Freq: Four times a day (QID) | ORAL | Status: DC | PRN
  Administered 2023-08-26 – 2023-08-27 (×3): 650 mg via ORAL
  Filled 2023-08-26 (×3): qty 2

## 2023-08-26 MED ORDER — OXYCODONE HCL 5 MG PO TABS: 5.0000 mg | ORAL_TABLET | ORAL | Status: DC | PRN

## 2023-08-26 MED ORDER — LIDOCAINE VISCOUS HCL 2 % MT SOLN: 15.0000 mL | Freq: Four times a day (QID) | OROMUCOSAL | Status: DC | PRN

## 2023-08-26 MED ORDER — NYSTATIN 100000 UNIT/ML MT SUSP: 5.0000 mL | Freq: Four times a day (QID) | OROMUCOSAL | Status: DC

## 2023-08-26 MED ORDER — METOPROLOL TARTRATE 25 MG PO TABS
25.0000 mg | ORAL_TABLET | Freq: Two times a day (BID) | ORAL | Status: DC
  Administered 2023-08-26 – 2023-08-30 (×8): 25 mg via ORAL
  Filled 2023-08-26 (×8): qty 1

## 2023-08-26 MED ORDER — LEVOTHYROXINE SODIUM 50 MCG PO TABS
50.0000 ug | ORAL_TABLET | Freq: Every day | ORAL | Status: DC
  Administered 2023-08-27 – 2023-08-30 (×3): 50 ug via ORAL
  Filled 2023-08-26 (×3): qty 1

## 2023-08-26 MED ORDER — SODIUM CHLORIDE 0.9 % IV BOLUS
1000.0000 mL | Freq: Once | INTRAVENOUS | Status: AC
  Administered 2023-08-26: 1000 mL via INTRAVENOUS

## 2023-08-26 MED ORDER — FLUCONAZOLE 100 MG PO TABS
100.0000 mg | ORAL_TABLET | Freq: Every day | ORAL | Status: DC
  Administered 2023-08-27 – 2023-08-30 (×4): 100 mg via ORAL
  Filled 2023-08-26 (×4): qty 1

## 2023-08-26 MED ORDER — ATORVASTATIN CALCIUM 40 MG PO TABS
40.0000 mg | ORAL_TABLET | Freq: Every day | ORAL | Status: DC
  Administered 2023-08-27 – 2023-08-30 (×4): 40 mg via ORAL
  Filled 2023-08-26 (×4): qty 1

## 2023-08-26 MED ORDER — ONDANSETRON HCL 4 MG/2ML IJ SOLN: 4.0000 mg | Freq: Four times a day (QID) | INTRAMUSCULAR | Status: DC | PRN

## 2023-08-26 MED ORDER — ASPIRIN 81 MG PO TBEC
81.0000 mg | DELAYED_RELEASE_TABLET | Freq: Every day | ORAL | Status: DC
  Administered 2023-08-27 – 2023-08-30 (×4): 81 mg via ORAL
  Filled 2023-08-26 (×4): qty 1

## 2023-08-26 MED ORDER — DOCUSATE SODIUM 50 MG/5ML PO LIQD
50.0000 mg | Freq: Every day | ORAL | Status: DC
  Administered 2023-08-26 – 2023-08-29 (×4): 50 mg via ORAL
  Filled 2023-08-26 (×6): qty 10

## 2023-08-26 MED ORDER — ENOXAPARIN SODIUM 30 MG/0.3ML IJ SOSY
30.0000 mg | PREFILLED_SYRINGE | INTRAMUSCULAR | Status: DC
  Administered 2023-08-26 – 2023-08-29 (×4): 30 mg via SUBCUTANEOUS
  Filled 2023-08-26 (×4): qty 0.3

## 2023-08-26 MED ORDER — GABAPENTIN 100 MG PO CAPS
100.0000 mg | ORAL_CAPSULE | Freq: Two times a day (BID) | ORAL | Status: DC
  Administered 2023-08-26 – 2023-08-30 (×8): 100 mg via ORAL
  Filled 2023-08-26 (×8): qty 1

## 2023-08-26 MED ORDER — FLUCONAZOLE 100 MG PO TABS
200.0000 mg | ORAL_TABLET | Freq: Once | ORAL | Status: AC
  Administered 2023-08-26: 200 mg via ORAL
  Filled 2023-08-26: qty 2

## 2023-08-26 NOTE — ED Provider Notes (Signed)
 [place] EMERGENCY DEPARTMENT AT [facility] Provider Note   CSN: [id number] Arrival date & time: 08/26/23  1355     History  Chief Complaint  Patient presents with   Dehydration    Charles Brooks is a 73 y.o. male.  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  He has been under the care of oncology and has been doing radiation treatments over the last month to 5 weeks.  In that timeframe the patient is gradually lost his appetite and has not had very much to eat or drink expressing that he is becoming lightheaded when he stands over the last few days.  He is feeling dry in the mouth, he was given a lidocaine solution but this is not helping to advance his diet, he has had very dark-colored urine and is becoming less and less frequent and smaller and smaller volume.  There is no diarrhea no abdominal pain no chest pain or shortness of breath, he does feel generally weak.  Home Medications Prior to Admission medications   Medication Sig Start Date End Date Taking? Authorizing Provider  acetaminophen (TYLENOL) 500 MG tablet Take 1,000 mg by mouth every 6 (six) hours as needed for moderate pain (pain score 4-6) or mild pain (pain score 1-3).    [provider]  aspirin 81 MG EC tablet Take 1 tablet (81 mg total) by mouth daily with breakfast. 07/07/19   Mariea Clonts, Courage, MD  atorvastatin (LIPITOR) 40 MG tablet Take 1 tablet (40 mg total) by mouth daily. 07/29/16   Jonelle Sidle, MD  cholecalciferol (VITAMIN D3) 25 MCG (1000 UNIT) tablet Take 1,000 Units by mouth daily.    [provider]  docusate (COLACE) 50 MG/5ML liquid Take by mouth at bedtime.    [provider]  gabapentin (NEURONTIN) 100 MG capsule Take 1 capsule (100 mg total) by mouth at bedtime. Patient  taking differently: Take 100 mg by mouth 2 (two) times daily. 10/15/21   Cleora Fleet, MD  levothyroxine (SYNTHROID) 50 MCG tablet Take 50 mcg by mouth daily before breakfast. 09/17/22   [provider]  lidocaine (XYLOCAINE) 2 % solution Patient: Mix 1part 2% viscous lidocaine, 1part H20. Swish & swallow 10mL of diluted mixture, before meals and at bedtime, up to QID 08/21/23   Lonie Peak, MD  meloxicam (MOBIC) 7.5 MG tablet Take 7.5 mg by mouth daily. Patient not taking: Reported on 07/14/2023 05/19/23   [provider]  metoprolol tartrate (LOPRESSOR) 25 MG tablet Take 25 mg by mouth 2 (two) times daily.    [provider]  nitroGLYCERIN (NITROSTAT) 0.4 MG SL tablet Place 1 tablet (0.4 mg total) under the tongue every 5 (five) minutes x 3 doses as needed for chest pain. 02/26/21   Jonelle Sidle, MD  vitamin B-12 (CYANOCOBALAMIN) 1000 MCG tablet Take 1,000 mcg by mouth daily.    [provider]  zinc gluconate 50 MG tablet Take 50 mg by mouth daily.    [provider]      Allergies    Codeine    Review of Systems   Review of Systems  All other systems reviewed and are negative.   Physical Exam Updated Vital Signs BP 105/60   Pulse 86  Temp 98.9 F (37.2 C) (Oral)   Resp 17   Ht 1.829 m (6')   Wt 82.6 kg   SpO2 100%   BMI 24.68 kg/m  Physical Exam Vitals and nursing note reviewed.  Constitutional:      General: He is not in acute distress.    Appearance: He is well-developed.  HENT:     Head: Normocephalic.     Comments: Extensive surgery on the left side of the face, absent ear, skin is erythematous from radiation, no purulence no foul smell, no pustules, no open wounds.    Mouth/Throat:     Mouth: Mucous membranes are dry.     Pharynx: No oropharyngeal exudate.     Comments: Thrush in the intraoral cavity, dry mucous membranes Eyes:     General: No scleral icterus.       Right eye: No discharge.        Left  eye: No discharge.     Conjunctiva/sclera: Conjunctivae normal.     Pupils: Pupils are equal, round, and reactive to light.  Neck:     Thyroid: No thyromegaly.     Vascular: No JVD.  Cardiovascular:     Rate and Rhythm: Normal rate and regular rhythm.     Heart sounds: Normal heart sounds. No murmur heard.    No friction rub. No gallop.  Pulmonary:     Effort: Pulmonary effort is normal. No respiratory distress.     Breath sounds: Normal breath sounds. No wheezing or rales.  Abdominal:     General: Bowel sounds are normal. There is no distension.     Palpations: Abdomen is soft. There is no mass.     Tenderness: There is no abdominal tenderness.  Musculoskeletal:        General: No tenderness. Normal range of motion.     Cervical back: Normal range of motion and neck supple.     Right lower leg: No edema.     Left lower leg: No edema.  Lymphadenopathy:     Cervical: No cervical adenopathy.  Skin:    General: Skin is warm and dry.     Findings: No erythema or rash.  Neurological:     General: No focal deficit present.     Mental Status: He is alert.     Coordination: Coordination normal.  Psychiatric:        Behavior: Behavior normal.     ED Results / Procedures / Treatments   Labs (all labs ordered are listed, but only abnormal results are displayed) Labs Reviewed  CBC WITH DIFFERENTIAL/PLATELET - Abnormal; Notable for the following components:      Result Value   WBC 12.5 (*)    RBC 3.68 (*)    Hemoglobin 11.5 (*)    HCT 36.9 (*)    MCV 100.3 (*)    Neutro Abs 11.4 (*)    Lymphs Abs 0.4 (*)    Abs Immature Granulocytes 0.09 (*)    All other components within normal limits  BASIC METABOLIC PANEL - Abnormal; Notable for the following components:   CO2 21 (*)    Glucose, Bld 104 (*)    BUN 52 (*)    Creatinine, Ser 4.69 (*)    GFR, Estimated 12 (*)    Anion gap 16 (*)    All other components within normal limits  URINALYSIS, ROUTINE W REFLEX MICROSCOPIC     EKG None  Radiology No results found.  Procedures .Critical Care  Performed by:  Eber Hong, MD Authorized by: Eber Hong, MD   Critical care provider statement:    Critical care time (minutes):  45   Critical care time was exclusive of:  Separately billable procedures and treating other patients   Critical care was necessary to treat or prevent imminent or life-threatening deterioration of the following conditions:  Renal failure and dehydration   Critical care was time spent personally by me on the following activities:  Development of treatment plan with patient or surrogate, discussions with consultants, evaluation of patient's response to treatment, examination of patient, obtaining history from patient or surrogate, review of old charts, re-evaluation of patient's condition, pulse oximetry, ordering and review of radiographic studies, ordering and review of laboratory studies and ordering and performing treatments and interventions   I assumed direction of critical care for this patient from another provider in my specialty: no     Care discussed with: admitting provider   Comments:           Medications Ordered in ED Medications  sodium chloride 0.9 % bolus 1,000 mL (1,000 mLs Intravenous New Bag/Given 08/26/23 1557)  sodium chloride 0.9 % bolus 1,000 mL (1,000 mLs Intravenous New Bag/Given 08/26/23 1556)    ED Course/ Medical Decision Making/ A&P                                 Medical Decision Making Amount and/or Complexity of Data Reviewed Labs: ordered.  Risk Decision regarding hospitalization.    This patient presents to the ED for concern of weakness and lightheadedness, this involves an extensive number of treatment options, and is a complaint that carries with it a high risk of complications and morbidity.  The differential diagnosis includes dehydration, electrolyte abnormality, kidney failure, possible underlying infection or UTI   Co morbidities  that complicate the patient evaluation  Recent cancer, extensive radiation, presence of thrush   Additional history obtained:  Additional history obtained from medical record External records from outside source obtained and reviewed including cancer treatments and inpatient stays and surgery   Lab Tests:  I Ordered, and personally interpreted labs.  The pertinent results include: Leukocytosis of 12,000, kidney failure with a creatinine of 4.5 or more which is more than double what it was last time.  BUN over 50.   Cardiac Monitoring: / EKG:  The patient was maintained on a cardiac monitor.  I personally viewed and interpreted the cardiac monitored which showed an underlying rhythm of: Normal sinus rhythm   Consultations Obtained:  I requested consultation with the hospitalist Dr. Adrian Blackwater,  and discussed lab and imaging findings as well as pertinent plan - they recommend: Admission   Problem List / ED Course / Critical interventions / Medication management  Patient was given IV fluids, requested urinalysis, measure postvoid residual, patient has a nontender abdomen, he will need to be admitted to the hospital for acute renal failure secondary to dehydration.  He will also need treatment for thrush I ordered medication including nystatin and IV fluids for [place] dehydration The blood pressure was borderline at 98/50, requiring IV fluid resuscitation Reevaluation of the patient after these medicines showed that the patient improved I have reviewed the patients home medicines and have made adjustments as needed   Social Determinants of Health:  Cancer, radiation   Test / Admission - Considered:  Admit to higher level of care          Final  Clinical Impression(s) / ED Diagnoses Final diagnoses:  Acute renal failure, unspecified acute renal failure type [facility])  Dehydration     Rx / DC Orders ED Discharge Orders     None         Eber Hong,  MD 08/26/23 2253

## 2023-08-26 NOTE — ED Notes (Signed)
 Attempted to start IV x2 and unable to obtain.

## 2023-08-26 NOTE — Progress Notes (Signed)
    08/26/23 2014  Vitals  Temp (!) 102.1 F (38.9 C)  Temp Source Axillary  BP 119/60  MAP (mmHg) 78  BP Location Left Arm  BP Method Automatic  Patient Position (if appropriate) Lying  Pulse Rate 90  Pulse Rate Source Monitor  Resp 18  Level of Consciousness  Level of Consciousness Alert  MEWS COLOR  MEWS Score Color Yellow  Oxygen Therapy  SpO2 93 %  O2 Device Room Air  MEWS Score  MEWS Temp 2  MEWS Systolic 0  MEWS Pulse 0  MEWS RR 0  MEWS LOC 0  MEWS Score 2

## 2023-08-26 NOTE — ED Triage Notes (Signed)
 Pt has been doing radiation tx for the last 34 days and due to radiation, he has sores in his mouth and throat and has not been able to eat or drink much for a the majority of his tx. Pt has had some vomiting as well from tx

## 2023-08-26 NOTE — H&P (Signed)
 History and Physical    Patient: Charles Brooks:[id number] DOB: 1950/09/26 DOA: 08/26/2023 DOS: the patient was seen and examined on 08/26/2023 PCP: [name], MD  Patient coming from: Home  Chief Complaint:  Chief Complaint  Patient presents with   Dehydration   HPI: Charles Brooks is a 73 y.o. 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  Patient has undergone a month and a half of radiation and has extensive skin effect of the left side of his face and neck.  In addition, he has extensive pain and difficulty swallowing, which has been going on for several weeks.  He has not been eating very much over the last month.  He does have sores in the mouth and has been using lidocaine to try to help, but this is mostly temporary.  Pain is with swallowing any sort of food or liquid.  He was brought in by his spouse due to ongoing weakness  Review of Systems: As mentioned in the history of present illness. All other systems reviewed and are negative. Past Medical History:  Diagnosis Date   Aneurysm (HCC)    in head per pt that sealed itself in 2007   Arthritis    CAD (coronary artery disease), native coronary artery    DES RCA and PTCA RVM 07/2014, LVEF 55%   Cellulitis and abscess 08/08/2015   CKD (chronic kidney disease) stage 3, GFR 30-59 ml/min (HCC)    Essential hypertension    History of kidney stones    Hyperkalemia 07/06/2019   Hyperlipidemia    Hypothyroidism    Neuropathy    STEMI (ST elevation myocardial infarction) (HCC)    07/2014   Past Surgical History:  Procedure Laterality Date   CHOLECYSTECTOMY     COLONOSCOPY N/A 02/09/2021   Procedure: COLONOSCOPY;  Surgeon: Franky Macho, MD;  [facility];   Service: Gastroenterology;  Laterality: N/A;   COLONOSCOPY WITH PROPOFOL N/A 11/08/2013   Procedure: COLONOSCOPY WITH PROPOFOL;  Surgeon: Dalia Heading, MD;  Location: [facility];  Service: General;  Laterality: N/A;  in cecum at 0741; cecal withdrawal time = 10 min   coronary stents      CYSTOSCOPY W/ RETROGRADES Bilateral 10/14/2021   Procedure: CYSTOSCOPY WITH RETROGRADE PYELOGRAM;  Surgeon: Milderd Meager., MD;  Location: [facility];  Service: Urology;  Laterality: Bilateral;   CYSTOSCOPY WITH STENT PLACEMENT Bilateral 10/14/2021   Procedure: CYSTOSCOPY WITH STENT PLACEMENT;  Surgeon: Milderd Meager., MD;  Location: [facility];  Service: Urology;  Laterality: Bilateral;   EAR CYST EXCISION Left 05/04/2023   Procedure: LEFT AURICULECTOMY;  Surgeon: Charles Reading, MD;  Location: [facility] OR;  Service: ENT;  Laterality: Left;   EXTRACORPOREAL SHOCK WAVE LITHOTRIPSY     EXTRACORPOREAL SHOCK WAVE LITHOTRIPSY Right 09/22/2020   Procedure: EXTRACORPOREAL SHOCK WAVE LITHOTRIPSY (ESWL);  Surgeon: Malen Gauze, MD;  Location: [facility];  Service: Urology;  Laterality: Right;  cases not starting until 10:00 due to last minute add ons, need time to get covid tests back   EXTRACORPOREAL SHOCK WAVE LITHOTRIPSY Left 10/26/2021   Procedure: EXTRACORPOREAL SHOCK WAVE LITHOTRIPSY (ESWL);  Surgeon: Milderd Meager., MD;  Location: [facility];  Service: Urology;  Laterality: Left;   INCISION AND DRAINAGE ABSCESS Right 08/12/2015  Procedure: INCISION AND DRAINAGE ABSCESS;  Surgeon: Franky Macho, MD;  Location: [facility];  Service: General;  Laterality: Right;   KNEE ARTHROSCOPY Right    LEFT HEART CATHETERIZATION WITH CORONARY ANGIOGRAM N/A 08/14/2014   Procedure: LEFT HEART CATHETERIZATION WITH CORONARY ANGIOGRAM;  Surgeon: Lennette Bihari, MD;  LAD 50-60%, CFX OK, OM1 80%(small), RCA 100>>0% w/ 2.25x24 mm Synergy DES, EF 55%   POLYPECTOMY N/A 11/08/2013   Procedure: POLYPECTOMY;  Surgeon: Dalia Heading, MD;   Location: [facility];  Service: General;  Laterality: N/A;  cecal polyp   POLYPECTOMY  02/09/2021   Procedure: POLYPECTOMY INTESTINAL;  Surgeon: Franky Macho, MD;  [facility];  Service: Gastroenterology;;   QUADRICEPS TENDON REPAIR Left 10/10/2022   Procedure: OPEN REPAIR QUADRICEP TENDON;  Surgeon: Yolonda Kida, MD;  Location: [facility] OR;  Service: Orthopedics;  Laterality: Left;  90   Removal of kidney stones     Open   URETEROSCOPY WITH HOLMIUM LASER LITHOTRIPSY Right 10/14/2021   Procedure: URETEROSCOPY WITH HOLMIUM LASER LITHOTRIPSY;  Surgeon: Milderd Meager., MD;  Location: [facility];  Service: Urology;  Laterality: Right;   WOUND DEBRIDEMENT Right 10/26/2015   Procedure: DEBRIDEMENT WOUND RIGHT FLANK;  Surgeon: Franky Macho, MD;  Location: [facility];  Service: General;  Laterality: Right;   Social History:  reports that he has never smoked. He has never used smokeless tobacco. He reports that he does not drink alcohol and does not use drugs.  Allergies  Allergen Reactions   Codeine Hives    Family History  Problem Relation Age of Onset   Heart attack Brother     Prior to Admission medications   Medication Sig Start Date End Date Taking? Authorizing Provider  acetaminophen (TYLENOL) 500 MG tablet Take 1,000 mg by mouth every 6 (six) hours as needed for moderate pain (pain score 4-6) or mild pain (pain score 1-3).    [provider]  aspirin 81 MG EC tablet Take 1 tablet (81 mg total) by mouth daily with breakfast. 07/07/19   Mariea Clonts, Courage, MD  atorvastatin (LIPITOR) 40 MG tablet Take 1 tablet (40 mg total) by mouth daily. 07/29/16   Jonelle Sidle, MD  cholecalciferol (VITAMIN D3) 25 MCG (1000 UNIT) tablet Take 1,000 Units by mouth daily.    [provider]  docusate (COLACE) 50 MG/5ML liquid Take by mouth at bedtime.    [provider]  gabapentin (NEURONTIN) 100 MG capsule Take 1 capsule (100 mg total) by mouth at bedtime. Patient  taking differently: Take 100 mg by mouth 2 (two) times daily. 10/15/21   Cleora Fleet, MD  levothyroxine (SYNTHROID) 50 MCG tablet Take 50 mcg by mouth daily before breakfast. 09/17/22   [provider]  lidocaine (XYLOCAINE) 2 % solution Patient: Mix 1part 2% viscous lidocaine, 1part H20. Swish & swallow 10mL of diluted mixture, before meals and at bedtime, up to QID 08/21/23   Lonie Peak, MD  meloxicam (MOBIC) 7.5 MG tablet Take 7.5 mg by mouth daily. Patient not taking: Reported on 07/14/2023 05/19/23   [provider]  metoprolol tartrate (LOPRESSOR) 25 MG tablet Take 25 mg by mouth 2 (two) times daily.    [provider]  nitroGLYCERIN (NITROSTAT) 0.4 MG SL tablet Place 1 tablet (0.4 mg total) under the tongue every 5 (five) minutes x 3 doses as needed for chest pain. 02/26/21   Jonelle Sidle, MD  vitamin B-12 (CYANOCOBALAMIN) 1000 MCG tablet Take 1,000 mcg by mouth  daily.    [provider]  zinc gluconate 50 MG tablet Take 50 mg by mouth daily.    [provider]    Physical Exam: Vitals:   08/26/23 1600 08/26/23 1602 08/26/23 1645 08/26/23 1717  BP: 105/60  130/82 (!) 158/71  Pulse: 86  80 99  Resp:  17  19  Temp:    100.3 F (37.9 C)  TempSrc:      SpO2: 100%  99% 100%  Weight:    81.5 kg  Height:       General: Elderly male. Awake and alert and oriented x3. No acute cardiopulmonary distress.  HEENT: Normocephalic atraumatic.  Left ear surgically absent with erythematous and flaking skin on the left side of the patient's head and neck.  Pupils equal, round, reactive to light. Extraocular muscles are intact. Sclerae anicteric and noninjected.  Dry mucosal membranes, thrush and oral cavity.  Neck: Neck supple without lymphadenopathy. No carotid bruits. No masses palpated.  Cardiovascular: Regular rate with normal S1-S2 sounds. No murmurs, rubs, gallops auscultated. No JVD.  Respiratory: Good respiratory effort with no  wheezes, rales, rhonchi. Lungs clear to auscultation bilaterally.  No accessory muscle use. Abdomen: Soft, nontender, nondistended. Active bowel sounds. No masses or hepatosplenomegaly  Skin: No rashes, lesions, or ulcerations.  Dry, warm to touch. 2+ dorsalis pedis and radial pulses. Musculoskeletal: No calf or leg pain. All major joints not erythematous nontender.  No upper or lower joint deformation.  Good ROM.  No contractures  Psychiatric: Intact judgment and insight. Pleasant and cooperative. Neurologic: No focal neurological deficits. Strength is 5/5 and symmetric in upper and lower extremities.  Cranial nerves II through XII are grossly intact.  Data Reviewed: Results for orders placed or performed during the hospital encounter of 08/26/23 (from the past 24 hours)  CBC with Differential     Status: Abnormal   Collection Time: 08/26/23  2:43 PM  Result Value Ref Range   WBC 12.5 (H) 4.0 - 10.5 K/uL   RBC 3.68 (L) 4.22 - 5.81 MIL/uL   Hemoglobin 11.5 (L) 13.0 - 17.0 g/dL   HCT 40.9 (L) 81.1 - 91.4 %   MCV 100.3 (H) 80.0 - 100.0 fL   MCH 31.3 26.0 - 34.0 pg   MCHC 31.2 30.0 - 36.0 g/dL   RDW 78.2 95.6 - 21.3 %   Platelets 191 150 - 400 K/uL   nRBC 0.0 0.0 - 0.2 %   Neutrophils Relative % 91 %   Neutro Abs 11.4 (H) 1.7 - 7.7 K/uL   Lymphocytes Relative 3 %   Lymphs Abs 0.4 (L) 0.7 - 4.0 K/uL   Monocytes Relative 5 %   Monocytes Absolute 0.6 0.1 - 1.0 K/uL   Eosinophils Relative 0 %   Eosinophils Absolute 0.0 0.0 - 0.5 K/uL   Basophils Relative 0 %   Basophils Absolute 0.0 0.0 - 0.1 K/uL   Immature Granulocytes 1 %   Abs Immature Granulocytes 0.09 (H) 0.00 - 0.07 K/uL  Basic metabolic panel     Status: Abnormal   Collection Time: 08/26/23  2:43 PM  Result Value Ref Range   Sodium 137 135 - 145 mmol/L   Potassium 5.0 3.5 - 5.1 mmol/L   Chloride 100 98 - 111 mmol/L   CO2 21 (L) 22 - 32 mmol/L   Glucose, Bld 104 (H) 70 - 99 mg/dL   BUN 52 (H) 8 - 23 mg/dL   Creatinine, Ser  0.86 (H) 0.61 - 1.24 mg/dL  Calcium 9.6 8.9 - 10.3 mg/dL   GFR, Estimated 12 (L) >60 mL/min   Anion gap 16 (H) 5 - 15    No results found.   Assessment and Plan: No notes have been filed under this hospital service. Service: Hospitalist  Principal Problem:   Acute kidney injury superimposed on chronic kidney disease (HCC) Active Problems:   CAD (coronary artery disease), native coronary artery   Squamous cell carcinoma of external ear, left   Thrush  Acute kidney injury superimposed on stage III chronic kidney disease Admit IV fluids Repeat metabolic panel in the morning Thrush As the patient has elevated white count, although mild, will start the patient on fluconazole Continue oral lidocaine Squamous cell carcinoma of the left ear status post resection Coronary artery disease Continue home medicine   Advance Care Planning:   Code Status: Full Code   Consults: None  Family Communication: Wife present  Severity of Illness: The appropriate patient status for this patient is INPATIENT. Inpatient status is judged to be reasonable and necessary in order to provide the required intensity of service to ensure the patient's safety. The patient's presenting symptoms, physical exam findings, and initial radiographic and laboratory data in the context of their chronic comorbidities is felt to place them at high risk for further clinical deterioration. Furthermore, it is not anticipated that the patient will be medically stable for discharge from the hospital within 2 midnights of admission.   * I certify that at the point of admission it is my clinical judgment that the patient will require inpatient hospital care spanning beyond 2 midnights from the point of admission due to high intensity of service, high risk for further deterioration and high frequency of surveillance required.*  Author: Levie Heritage, DO 08/26/2023 5:36 PM  For on call review [email or web address].

## 2023-08-27 DIAGNOSIS — N179 Acute kidney failure, unspecified: Secondary | ICD-10-CM | POA: Diagnosis not present

## 2023-08-27 DIAGNOSIS — N189 Chronic kidney disease, unspecified: Secondary | ICD-10-CM

## 2023-08-27 DIAGNOSIS — N390 Urinary tract infection, site not specified: Secondary | ICD-10-CM | POA: Diagnosis present

## 2023-08-27 LAB — CBC
HCT: 32.5 % — ABNORMAL LOW (ref 39.0–52.0)
Hemoglobin: 10.3 g/dL — ABNORMAL LOW (ref 13.0–17.0)
MCH: 31.7 pg (ref 26.0–34.0)
MCHC: 31.7 g/dL (ref 30.0–36.0)
MCV: 100 fL (ref 80.0–100.0)
Platelets: 153 10*3/uL (ref 150–400)
RBC: 3.25 MIL/uL — ABNORMAL LOW (ref 4.22–5.81)
RDW: 13.8 % (ref 11.5–15.5)
WBC: 7.5 10*3/uL (ref 4.0–10.5)
nRBC: 0 % (ref 0.0–0.2)

## 2023-08-27 LAB — BASIC METABOLIC PANEL
Anion gap: 9 (ref 5–15)
BUN: 46 mg/dL — ABNORMAL HIGH (ref 8–23)
CO2: 21 mmol/L — ABNORMAL LOW (ref 22–32)
Calcium: 8.4 mg/dL — ABNORMAL LOW (ref 8.9–10.3)
Chloride: 106 mmol/L (ref 98–111)
Creatinine, Ser: 3.82 mg/dL — ABNORMAL HIGH (ref 0.61–1.24)
GFR, Estimated: 16 mL/min — ABNORMAL LOW (ref >60)
Glucose, Bld: 92 mg/dL (ref 70–99)
Potassium: 4.8 mmol/L (ref 3.5–5.1)
Sodium: 136 mmol/L (ref 135–145)

## 2023-08-27 MED ORDER — LACTATED RINGERS IV SOLN: INTRAVENOUS | Status: AC

## 2023-08-27 MED ORDER — LACTATED RINGERS IV SOLN: INTRAVENOUS | Status: DC

## 2023-08-27 MED ORDER — RISAQUAD PO CAPS
2.0000 | ORAL_CAPSULE | Freq: Three times a day (TID) | ORAL | Status: DC
  Administered 2023-08-27 – 2023-08-30 (×10): 2 via ORAL
  Filled 2023-08-27 (×10): qty 2

## 2023-08-27 MED ORDER — NYSTATIN 100000 UNIT/ML MT SUSP
5.0000 mL | Freq: Four times a day (QID) | OROMUCOSAL | Status: DC
  Administered 2023-08-27 – 2023-08-30 (×12): 500000 [IU] via ORAL
  Filled 2023-08-27 (×13): qty 5

## 2023-08-27 MED ORDER — MAGIC MOUTHWASH W/LIDOCAINE
5.0000 mL | Freq: Three times a day (TID) | ORAL | Status: DC
  Administered 2023-08-27 – 2023-08-30 (×9): 5 mL via ORAL
  Filled 2023-08-27 (×13): qty 5

## 2023-08-27 MED ORDER — SODIUM CHLORIDE 0.9 % IV SOLN
1.0000 g | INTRAVENOUS | Status: DC
  Administered 2023-08-27 – 2023-08-30 (×4): 1 g via INTRAVENOUS
  Filled 2023-08-27 (×4): qty 10

## 2023-08-27 MED ORDER — SONAFINE EX EMUL
1.0000 | Freq: Four times a day (QID) | CUTANEOUS | Status: DC
  Administered 2023-08-27 – 2023-08-29 (×10): 1 via TOPICAL
  Filled 2023-08-27: qty 1

## 2023-08-27 NOTE — Progress Notes (Signed)
   08/27/23 0036  Vitals  Temp 99.7 F (37.6 C)  Temp Source Oral  BP (!) 91/55  MAP (mmHg) 66  BP Location Left Arm  BP Method Automatic  Patient Position (if appropriate) Lying  Pulse Rate 67  Pulse Rate Source Monitor  Resp 16  Level of Consciousness  Level of Consciousness Alert  MEWS COLOR  MEWS Score Color Green  Oxygen Therapy  SpO2 96 %  O2 Device Room Air  MEWS Score  MEWS Temp 0  MEWS Systolic 1  MEWS Pulse 0  MEWS RR 0  MEWS LOC 0  MEWS Score 1

## 2023-08-27 NOTE — Progress Notes (Signed)
   08/27/23 1356  TOC Brief Assessment  Insurance and Status Reviewed  Patient has primary care physician Yes  Home environment has been reviewed Home with spouse  Prior level of function: Independent  Prior/Current Home Services No current home services  Social Drivers of Health Review SDOH reviewed no interventions necessary  Readmission risk has been reviewed Yes  Transition of care needs no transition of care needs at this time   Transition of Care Department [facility]) has reviewed patient and no TOC needs have been identified at this time. We will continue to monitor patient advancement through interdisciplinary progression rounds. If new patient transition needs arise, please place a TOC consult.

## 2023-08-27 NOTE — Assessment & Plan Note (Signed)
-   Continue home dose statins ?

## 2023-08-27 NOTE — Assessment & Plan Note (Signed)
 On admission. - UA positive for leukocyte esterase, greater than 50 WBCs. -Will initiate IV antibiotics Rocephin. -Obtain cultures  08-30-2023. Urine cx growing proteus. Sensitive to rocephin. Will treat at discharge with duricef

## 2023-08-27 NOTE — Assessment & Plan Note (Signed)
-   Continue treatment as an outpatient Currently stable

## 2023-08-27 NOTE — Assessment & Plan Note (Signed)
 On admission. - Likely due to radiation therapy. - Continue Magic mouthwash, lidocaine, fluconazole  08-30-2023 DC to home with magic mouthwash. Continue po diflucan.

## 2023-08-27 NOTE — Assessment & Plan Note (Signed)
 On admission. Acute on chronic kidney disease 3B. -Monitor kidney function closely. -Worsening kidney function due to poor hydration, poor p.o. intake.  08-30-2023 back to baseline of 2.1

## 2023-08-27 NOTE — Assessment & Plan Note (Signed)
 On admission. - Likely due to dehydration, for p.o. intake, due to oral thrush difficulty swallowing previous ischemia, -Continuing IV fluids. -Initiating fluconazole, and Magic mouthwash -Urging oral intake. -BUN/creatinine closely -avoid nephrotoxins, hypotension  Lab Results  Component Value Date   CREATININE 3.82 (H) 08/27/2023   CREATININE 4.69 (H) 08/26/2023   CREATININE 1.88 (H) 05/03/2023   08-30-2023 scr back to baseline. Eating well. Ready to go home.

## 2023-08-27 NOTE — Progress Notes (Signed)
 PROGRESS NOTE    Patient: Charles Brooks                            PCP: [name], MD                    DOB: 01/22/1951            DOA: 08/26/2023 [id number]             DOS: 08/27/2023, 8:49 AM   LOS: 1 day   Date of Service: The patient was seen and examined on 08/27/2023  Subjective:   The patient was seen and examined this morning. Hemodynamically stable. Still complaining of difficulty swallowing No issues overnight .  Brief Narrative:   Charles Brooks is a 73 y.o. 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  Patient has undergone a month and a half of radiation and has extensive skin effect of the left side of his face and neck.  In addition, he has extensive pain and difficulty swallowing, which has been going on for several weeks.  He has not been eating very much over the last month.  He does have sores in the mouth and has been using lidocaine to try to help, but this is mostly temporary.  Pain is with swallowing any sort of food or liquid.  He was brought in by his spouse due to ongoing weakness.  ED evaluation:  Tmax of 103, BP as low as 91/55, satting 93% on room air, RR 18 PR 90 Labs WBC 12.5, BUN 52, creatinine 4.69, UA cloudy positive for leukocyte esterase, BC>50    Assessment & Plan:   Principal Problem:   Acute kidney injury superimposed on chronic kidney disease (HCC) Active Problems:   CKD (chronic kidney disease) stage 3, GFR 30-59 ml/min (HCC)   UTI (urinary tract infection)   Dyslipidemia - low HDL   CAD (coronary artery disease), native coronary artery   Squamous cell carcinoma of external ear, left   Thrush     Assessment and Plan: * Acute kidney injury superimposed on chronic kidney  disease (HCC) - Likely due to dehydration, for p.o. intake, due to oral thrush difficulty swallowing previous ischemia, -Continuing IV fluids -Initiating fluconazole, and Magic mouthwash -Urging oral intake -BUN/creatinine closely -avoid nephrotoxins, hypotension  Lab Results  Component Value Date   CREATININE 3.82 (H) 08/27/2023   CREATININE 4.69 (H) 08/26/2023   CREATININE 1.88 (H) 05/03/2023     UTI (urinary tract infection) - UA positive for leukocyte esterase, greater than 50 WBCs -Will initiate IV antibiotics Rocephin -Obtain cultures  CKD (chronic kidney disease) stage 3, GFR 30-59 ml/min (HCC) Acute on chronic kidney disease 3B -Monitor kidney function closely -Worsening kidney function due to poor hydration, poor p.o. intake -Close  Dyslipidemia - low HDL Continue home dose statins  Thrush - Likely due to radiation therapy - Continue Magic mouthwash, lidocaine, fluconazole  Squamous cell carcinoma of external ear, left - Continue treatment as an outpatient Currently stable  CAD (coronary artery disease), native coronary artery - Denies any chest pain, resuming home medication of metoprolol, statins, aspirin,   ------------------------------------------------------------------------------------------------------------------------------------------ Nutritional status:  The patient's BMI is: Body mass index is 24.37 kg/m. I  agree with the assessment and plan as outlined Skin Assessment: I have examined the patient's skin and I agree with the wound assessment as performed by wound care team    ------------------------------------------------------------------------------------------------------------------------------------------ Cultures;  Urine Culture  >>>    -----------------------------------------------------------------------------------------------------------------------------------------  DVT prophylaxis:  enoxaparin (LOVENOX) injection 30 mg  Start: 08/26/23 2200   Code Status:   Code Status: Full Code  Family Communication: No family member present at bedside- -Advance care planning has been discussed.   Admission status:   Status is: Inpatient Remains inpatient appropriate because: Continued need for IV antibiotics, IV fluids,   Disposition: From  - home             Planning for discharge in 1-2 days: to Home   Procedures:   No admission procedures for hospital encounter.   Antimicrobials:  Anti-infectives (From admission, onward)    Start     Dose/Rate Route Frequency Ordered Stop   08/27/23 1000  fluconazole (DIFLUCAN) tablet 100 mg       Placed in "Followed by" Linked Group   100 mg Oral Daily 08/26/23 1735     08/26/23 1830  fluconazole (DIFLUCAN) tablet 200 mg       Placed in "Followed by" Linked Group   200 mg Oral  Once 08/26/23 1735 08/26/23 1840        Medication:   aspirin EC  81 mg Oral Q breakfast   atorvastatin  40 mg Oral Daily   docusate  50 mg Oral QHS   enoxaparin (LOVENOX) injection  30 mg Subcutaneous Q24H   fluconazole  100 mg Oral Daily   gabapentin  100 mg Oral BID   levothyroxine  50 mcg Oral QAC breakfast   metoprolol tartrate  25 mg Oral BID    acetaminophen, lidocaine, ondansetron **OR** ondansetron (ZOFRAN) IV, oxyCODONE   Objective:   Vitals:   08/26/23 2200 08/27/23 0036 08/27/23 0536 08/27/23 0643  BP:  (!) 91/55 117/61   Pulse:  67 90   Resp:  16 18   Temp: (!) 100.8 F (38.2 C) 99.7 F (37.6 C) (!) 103 F (39.4 C) 100.1 F (37.8 C)  TempSrc: Oral Oral Oral Oral  SpO2: 94% 96% 96%   Weight:      Height:        Intake/Output Summary (Last 24 hours) at 08/27/2023 0849 Last data filed at 08/27/2023 1610 Gross per 24 hour  Intake 480 ml  Output 550 ml  Net -70 ml   Filed Weights   08/26/23 1404 08/26/23 1717  Weight: 82.6 kg 81.5 kg     Physical examination:   Constitution:  Alert, cooperative, no distress,  Appears calm and comfortable   Psychiatric:   Normal and stable mood and affect, cognition intact,   HEENT:        Normocephalic, PERRL, otherwise with in Normal limits  Chest:         Chest symmetric Cardio vascular:  S1/S2, RRR, No murmure, No Rubs or Gallops  pulmonary: Clear to auscultation bilaterally, respirations unlabored, negative wheezes / crackles Abdomen: Soft, non-tender, non-distended, bowel sounds,no masses, no organomegaly Muscular skeletal: Limited exam - in bed, able to move all 4 extremities,   Neuro: CNII-XII intact. , normal motor and sensation, reflexes intact  Extremities: No pitting edema lower extremities, +2 pulses  Skin: Dry, warm to touch, negative for any Rashes, No open wounds Wounds: per nursing documentation   ------------------------------------------------------------------------------------------------------------------------------------------    LABs:     Latest Ref Rng &  Units 08/27/2023    4:53 AM 08/26/2023    2:43 PM 05/03/2023    2:30 PM  CBC  WBC 4.0 - 10.5 K/uL 7.5  12.5  10.2   Hemoglobin 13.0 - 17.0 g/dL 81.1  91.4  78.2   Hematocrit 39.0 - 52.0 % 32.5  36.9  35.4   Platelets 150 - 400 K/uL 153  191  231       Latest Ref Rng & Units 08/27/2023    4:53 AM 08/26/2023    2:43 PM 05/03/2023    2:30 PM  CMP  Glucose 70 - 99 mg/dL 92  956  92   BUN 8 - 23 mg/dL 46  52  22   Creatinine 0.61 - 1.24 mg/dL 2.13  0.86  5.78   Sodium 135 - 145 mmol/L 136  137  141   Potassium 3.5 - 5.1 mmol/L 4.8  5.0  3.9   Chloride 98 - 111 mmol/L 106  100  111   CO2 22 - 32 mmol/L 21  21  20    Calcium 8.9 - 10.3 mg/dL 8.4  9.6  9.9        Micro Results No results found for this or any previous visit (from the past 240 hours).  Radiology Reports No results found.  SIGNED: Kendell Bane, MD, FHM. FAAFP. [facility] - [facility] Time spent - 55 min.  In seeing, evaluating and examining the patient. Reviewing medical records, labs, drawn plan of care. [facility],   Pager (please use [email or web address] to page/ text) Please use Epic Secure Chat for non-urgent communication (7AM-7PM)  If 7PM-7AM, please contact night-coverage [email or web address], 08/27/2023, 8:49 AM

## 2023-08-27 NOTE — Assessment & Plan Note (Signed)
-   Denies any chest pain, resuming home medication of metoprolol, statins, aspirin,
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
## 2023-08-28 DIAGNOSIS — N189 Chronic kidney disease, unspecified: Secondary | ICD-10-CM | POA: Diagnosis not present

## 2023-08-28 DIAGNOSIS — N179 Acute kidney failure, unspecified: Secondary | ICD-10-CM | POA: Diagnosis not present

## 2023-08-28 LAB — BASIC METABOLIC PANEL
Anion gap: 8 (ref 5–15)
BUN: 38 mg/dL — ABNORMAL HIGH (ref 8–23)
CO2: 21 mmol/L — ABNORMAL LOW (ref 22–32)
Calcium: 8.5 mg/dL — ABNORMAL LOW (ref 8.9–10.3)
Chloride: 108 mmol/L (ref 98–111)
Creatinine, Ser: 2.94 mg/dL — ABNORMAL HIGH (ref 0.61–1.24)
GFR, Estimated: 22 mL/min — ABNORMAL LOW (ref >60)
Glucose, Bld: 93 mg/dL (ref 70–99)
Potassium: 4.9 mmol/L (ref 3.5–5.1)
Sodium: 137 mmol/L (ref 135–145)

## 2023-08-28 LAB — CBC
HCT: 29.6 % — ABNORMAL LOW (ref 39.0–52.0)
Hemoglobin: 9.4 g/dL — ABNORMAL LOW (ref 13.0–17.0)
MCH: 31.3 pg (ref 26.0–34.0)
MCHC: 31.8 g/dL (ref 30.0–36.0)
MCV: 98.7 fL (ref 80.0–100.0)
Platelets: 157 10*3/uL (ref 150–400)
RBC: 3 MIL/uL — ABNORMAL LOW (ref 4.22–5.81)
RDW: 13.8 % (ref 11.5–15.5)
WBC: 5.9 10*3/uL (ref 4.0–10.5)
nRBC: 0 % (ref 0.0–0.2)

## 2023-08-28 MED ORDER — LACTATED RINGERS IV SOLN: INTRAVENOUS | Status: AC

## 2023-08-28 NOTE — Radiation Completion Notes (Signed)
 Patient Name: Charles Brooks, Charles Brooks MRN: [id number] Date of Birth: [birth year] Referring Physician: Christia Reading, M.D. Date of Service: 2023-08-28 Radiation Oncologist: Lonie Peak, M.D. [facility]                             RADIATION ONCOLOGY END OF TREATMENT NOTE     Diagnosis: C44.229 Squamous cell carcinoma of skin of left ear and external auricular canal Intent: Curative     ==========DELIVERED PLANS==========  First Treatment Date: 2023-07-27 Last Treatment Date: 2023-08-25   Plan Name: HN_L Site: Neck Left Technique: IMRT Mode: Photon Dose Per Fraction: 2.5 Gy Prescribed Dose (Delivered / Prescribed): 50 Gy / 50 Gy Prescribed Fxs (Delivered / Prescribed): 20 / 20     ==========ON TREATMENT VISIT DATES========== 2023-07-31, 2023-08-07, 2023-08-14, 2023-08-21     ==========UPCOMING VISITS==========       ==========APPENDIX - ON TREATMENT VISIT NOTES==========   See weekly On Treatment Notes in Epic for details in the Media tab (listed as Progress notes on the On Treatment Visit Dates listed above).
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
## 2023-08-29 DIAGNOSIS — N179 Acute kidney failure, unspecified: Secondary | ICD-10-CM | POA: Diagnosis not present

## 2023-08-29 DIAGNOSIS — N189 Chronic kidney disease, unspecified: Secondary | ICD-10-CM | POA: Diagnosis not present

## 2023-08-29 LAB — CBC
HCT: 30.9 % — ABNORMAL LOW (ref 39.0–52.0)
Hemoglobin: 10.1 g/dL — ABNORMAL LOW (ref 13.0–17.0)
MCH: 31.6 pg (ref 26.0–34.0)
MCHC: 32.7 g/dL (ref 30.0–36.0)
MCV: 96.6 fL (ref 80.0–100.0)
Platelets: 162 10*3/uL (ref 150–400)
RBC: 3.2 MIL/uL — ABNORMAL LOW (ref 4.22–5.81)
RDW: 13.5 % (ref 11.5–15.5)
WBC: 6 10*3/uL (ref 4.0–10.5)
nRBC: 0 % (ref 0.0–0.2)

## 2023-08-29 LAB — URINE CULTURE: Culture: >=100000 — AB

## 2023-08-29 LAB — BASIC METABOLIC PANEL
Anion gap: 9 (ref 5–15)
BUN: 35 mg/dL — ABNORMAL HIGH (ref 8–23)
CO2: 21 mmol/L — ABNORMAL LOW (ref 22–32)
Calcium: 8.7 mg/dL — ABNORMAL LOW (ref 8.9–10.3)
Chloride: 107 mmol/L (ref 98–111)
Creatinine, Ser: 2.35 mg/dL — ABNORMAL HIGH (ref 0.61–1.24)
GFR, Estimated: 29 mL/min — ABNORMAL LOW (ref >60)
Glucose, Bld: 97 mg/dL (ref 70–99)
Potassium: 4.8 mmol/L (ref 3.5–5.1)
Sodium: 137 mmol/L (ref 135–145)

## 2023-08-29 MED ORDER — LACTATED RINGERS IV SOLN: INTRAVENOUS | Status: DC

## 2023-08-29 NOTE — Care Management Important Message (Signed)
 Important Message  Patient Details  Name: Charles Brooks MRN: [id number] Date of Birth: [birth year]   Important Message Given:  Yes - Medicare IM     Corey Harold 08/29/2023, 4:37 PM

## 2023-08-29 NOTE — Plan of Care (Signed)
  Problem: Education: Goal: Knowledge of General Education information will improve Description: Including pain rating scale, medication(s)/side effects and non-pharmacologic comfort measures Outcome: Progressing   Problem: Health Behavior/Discharge Planning: Goal: Ability to manage health-related needs will improve Outcome: Progressing   Problem: Clinical Measurements: Goal: Ability to maintain clinical measurements within normal limits will improve Outcome: Progressing Goal: Respiratory complications will improve Outcome: Progressing Goal: Cardiovascular complication will be avoided Outcome: Progressing   Problem: Activity: Goal: Risk for activity intolerance will decrease Outcome: Progressing   Problem: Coping: Goal: Level of anxiety will decrease Outcome: Progressing   Problem: Elimination: Goal: Will not experience complications related to bowel motility Outcome: Progressing Goal: Will not experience complications related to urinary retention Outcome: Progressing

## 2023-08-29 NOTE — Progress Notes (Signed)
 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  At the emergency room temperature 103, blood pressure 91/55.  On room air.  WC count 12.5.  BUN 52.  Creatinine 4.69.  UA was abnormal.  Admitted with AKI, UTI.  Subjective:  Patient seen and examined.  Less painful swallowing and able to eat some food.  Has not mobilized yet.  Afebrile.  Renal functions improving.  He is forcing on drinking more water. Will check postvoid residuals.  Assessment & Plan:   AKI on CKD stage IIIb: Secondary to dehydration, and decreased oral intake from oral thrush and radiation injury. Patient is appropriately responding to IV fluid hydration.  Will continue today.  Recheck renal functions tomorrow.  Encourage oral intake.  He was Diflucan and Magic mouthwash.  No evidence of urinary obstruction. Recent known baseline creatinine 1.9.  Presentation creatinine 4.69--trending down 2.35 today.  Acute UTI present on admission: Urine culture is growing Proteus.  Continue Rocephin today.  No evidence of urinary obstruction.  Will treat as complicated UTI with oral antibiotics for total of 10 days on discharge.  Oral thrush and dysphagia: Due to radiation-induced mucositis.  Symptomatic management.  Hyperlipidemia, statin Squamous cell carcinoma left ear, outpatient follow-up. Coronary artery disease, currently stable.  On metoprolol, statins and aspirin.   DVT prophylaxis: enoxaparin (LOVENOX) injection 30 mg Start: 08/26/23 2200   Code Status: Full code Family Communication: Wife on the phone Disposition Plan: Status  is: Inpatient Remains inpatient appropriate because: IV antibiotics, IV fluids     Consultants:  None  Procedures:  None  Antimicrobials:  Rocephin 3/8---     Objective: Vitals:   08/28/23 0510 08/28/23 1250 08/28/23 2002 08/29/23 0459  BP: 122/60 112/68 (!) 132/56 (!) 142/64  Pulse: 74 63 71 71  Resp: 16 17 19 19   Temp: 100.1 F (37.8 C) 99 F (37.2 C) 99.8 F (37.7 C) 99.4 F (37.4 C)  TempSrc: Oral Oral Oral Oral  SpO2: 95% 99% 94% 98%  Weight:      Height:        Intake/Output Summary (Last 24 hours) at 08/29/2023 1046 Last data filed at 08/29/2023 0900 Gross per 24 hour  Intake 804.89 ml  Output 600 ml  Net 204.89 ml   Filed Weights   08/26/23 1404 08/26/23 1717  Weight: 82.6 kg 81.5 kg    Examination:  General exam: Appears calm and comfortable at rest.  Pleasant interactive. Respiratory system: Clear to auscultation. Respiratory effort normal.  No added sounds. Cardiovascular system: S1 & S2 heard, RRR.  Gastrointestinal system: Soft.  Nontender.  Bowel sound present. Central nervous system: Alert and oriented. No focal neurological deficits. Extremities: Symmetric 5 x 5 power. Skin:   erythema of the neck, post operative sites , superficial skin ulceration both necks . Oral cavity looks clear with some redness . No exudates.     Data Reviewed: I have personally reviewed following labs and imaging studies  CBC: Recent Labs  Lab 08/26/23 1443 08/27/23 0453 08/28/23 0349 08/29/23 0429  WBC 12.5* 7.5 5.9 6.0  NEUTROABS 11.4*  --   --   --  HGB 11.5* 10.3* 9.4* 10.1*  HCT 36.9* 32.5* 29.6* 30.9*  MCV 100.3* 100.0 98.7 96.6  PLT 191 153 157 162   Basic Metabolic Panel: Recent Labs  Lab 08/26/23 1443 08/27/23 0453 08/28/23 0349 08/29/23 0429  NA 137 136 137 137  K 5.0 4.8 4.9 4.8  CL 100 106 108 107  CO2 21* 21* 21* 21*  GLUCOSE 104* 92 93 97  BUN 52* 46* 38* 35*  CREATININE 4.69* 3.82* 2.94* 2.35*  CALCIUM 9.6 8.4* 8.5* 8.7*    GFR: Estimated Creatinine Clearance: 30.7 mL/min (A) (by C-G formula based on SCr of 2.35 mg/dL (H)). Liver Function Tests: No results for input(s): "AST", "ALT", "ALKPHOS", "BILITOT", "PROT", "ALBUMIN" in the last 168 hours. No results for input(s): "LIPASE", "AMYLASE" in the last 168 hours. No results for input(s): "AMMONIA" in the last 168 hours. Coagulation Profile: No results for input(s): "INR", "PROTIME" in the last 168 hours. Cardiac Enzymes: No results for input(s): "CKTOTAL", "CKMB", "CKMBINDEX", "TROPONINI" in the last 168 hours. BNP (last 3 results) No results for input(s): "PROBNP" in the last 8760 hours. HbA1C: No results for input(s): "HGBA1C" in the last 72 hours. CBG: No results for input(s): "GLUCAP" in the last 168 hours. Lipid Profile: No results for input(s): "CHOL", "HDL", "LDLCALC", "TRIG", "CHOLHDL", "LDLDIRECT" in the last 72 hours. Thyroid Function Tests: No results for input(s): "TSH", "T4TOTAL", "FREET4", "T3FREE", "THYROIDAB" in the last 72 hours. Anemia Panel: No results for input(s): "VITAMINB12", "FOLATE", "FERRITIN", "TIBC", "IRON", "RETICCTPCT" in the last 72 hours. Sepsis Labs: No results for input(s): "PROCALCITON", "LATICACIDVEN" in the last 168 hours.  Recent Results (from the past 240 hours)  Urine Culture (for pregnant, neutropenic or urologic patients or patients with an indwelling urinary catheter)     Status: Abnormal   Collection Time: 08/27/23  8:50 AM   Specimen: Urine, Clean Catch  Result Value Ref Range Status   Specimen Description   Final    URINE, CLEAN CATCH Performed at [facility], [address], [place], [place] [address]    Special Requests   Final    NONE Performed at [facility], [address] [place], [place] [address]    Culture >=100,000 COLONIES/mL PROTEUS MIRABILIS (A)  Final   Report Status 08/29/2023 FINAL  Final   Organism ID, Bacteria PROTEUS MIRABILIS (A)  Final      Susceptibility   Proteus mirabilis  - MIC*    AMPICILLIN <=2 SENSITIVE Sensitive     CEFAZOLIN 8 SENSITIVE Sensitive     CEFEPIME <=0.12 SENSITIVE Sensitive     CEFTRIAXONE <=0.25 SENSITIVE Sensitive     CIPROFLOXACIN >=4 RESISTANT Resistant     GENTAMICIN <=1 SENSITIVE Sensitive     IMIPENEM 2 SENSITIVE Sensitive     NITROFURANTOIN 128 RESISTANT Resistant     TRIMETH/SULFA >=320 RESISTANT Resistant     AMPICILLIN/SULBACTAM <=2 SENSITIVE Sensitive     PIP/TAZO <=4 SENSITIVE Sensitive ug/mL    * >=100,000 COLONIES/mL PROTEUS MIRABILIS  Culture, blood (Routine X 2) w Reflex to ID Panel     Status: None (Preliminary result)   Collection Time: 08/27/23 10:13 AM   Specimen: BLOOD  Result Value Ref Range Status   Specimen Description BLOOD BLOOD LEFT HAND  Final   Special Requests   Final    BOTTLES DRAWN AEROBIC AND ANAEROBIC Blood Culture adequate volume   Culture   Final    NO GROWTH 2 DAYS Performed at [facility], [address], [place], [place] [address]  Report Status PENDING  Incomplete  Culture, blood (Routine X 2) w Reflex to ID Panel     Status: None (Preliminary result)   Collection Time: 08/27/23 10:13 AM   Specimen: BLOOD  Result Value Ref Range Status   Specimen Description BLOOD BLOOD LEFT ARM  Final   Special Requests   Final    BOTTLES DRAWN AEROBIC AND ANAEROBIC Blood Culture adequate volume   Culture   Final    NO GROWTH 2 DAYS Performed at [facility], [address] [place], [place] [address]    Report Status PENDING  Incomplete         Radiology Studies: No results found.      Scheduled Meds:  acidophilus  2 capsule Oral TID   aspirin EC  81 mg Oral Q breakfast   atorvastatin  40 mg Oral Daily   docusate  50 mg Oral QHS   enoxaparin (LOVENOX) injection  30 mg Subcutaneous Q24H   fluconazole  100 mg Oral Daily   gabapentin  100 mg Oral BID   levothyroxine  50 mcg Oral QAC breakfast   magic mouthwash w/lidocaine  5 mL Oral TID   metoprolol tartrate  25 mg Oral BID    nystatin  5 mL Oral QID   Sonafine  1 Application Topical QID   Continuous Infusions:  cefTRIAXone (ROCEPHIN)  IV 1 g (08/29/23 0911)   lactated ringers       LOS: 3 days    Time spent: 52 minutes     Dorcas Carrow, MD [facility]

## 2023-08-29 NOTE — Progress Notes (Signed)
 Mobility Specialist Progress Note:    08/29/23 0930  Mobility  Activity Ambulated with assistance in hallway  Level of Assistance Standby assist, set-up cues, supervision of patient - no hands on  Assistive Device Front wheel walker  Distance Ambulated (ft) 150 ft  Range of Motion/Exercises Active;All extremities  Activity Response Tolerated well  Mobility Referral Yes  Mobility visit 1 Mobility  Mobility Specialist Start Time (ACUTE ONLY) 0910  Mobility Specialist Stop Time (ACUTE ONLY) 0930  Mobility Specialist Time Calculation (min) (ACUTE ONLY) 20 min   Pt received in bed, agreeable to mobility. Required SBA to stand and ambulate with RW. Tolerated well, asx throughout. Returned to room, left pt supine. Alarm on, all needs met.   Lawerance Bach Mobility Specialist Please contact via Special educational needs teacher or  Rehab office at [phone]

## 2023-08-30 DIAGNOSIS — N3000 Acute cystitis without hematuria: Secondary | ICD-10-CM

## 2023-08-30 DIAGNOSIS — N1832 Chronic kidney disease, stage 3b: Secondary | ICD-10-CM

## 2023-08-30 DIAGNOSIS — C44229 Squamous cell carcinoma of skin of left ear and external auricular canal: Secondary | ICD-10-CM

## 2023-08-30 DIAGNOSIS — B370 Candidal stomatitis: Secondary | ICD-10-CM

## 2023-08-30 DIAGNOSIS — N179 Acute kidney failure, unspecified: Secondary | ICD-10-CM | POA: Diagnosis not present

## 2023-08-30 LAB — BASIC METABOLIC PANEL
Anion gap: 9 (ref 5–15)
BUN: 32 mg/dL — ABNORMAL HIGH (ref 8–23)
CO2: 22 mmol/L (ref 22–32)
Calcium: 8.6 mg/dL — ABNORMAL LOW (ref 8.9–10.3)
Chloride: 106 mmol/L (ref 98–111)
Creatinine, Ser: 2.11 mg/dL — ABNORMAL HIGH (ref 0.61–1.24)
GFR, Estimated: 32 mL/min — ABNORMAL LOW (ref >60)
Glucose, Bld: 93 mg/dL (ref 70–99)
Potassium: 4.6 mmol/L (ref 3.5–5.1)
Sodium: 137 mmol/L (ref 135–145)

## 2023-08-30 LAB — CBC
HCT: 32.2 % — ABNORMAL LOW (ref 39.0–52.0)
Hemoglobin: 10.1 g/dL — ABNORMAL LOW (ref 13.0–17.0)
MCH: 30.5 pg (ref 26.0–34.0)
MCHC: 31.4 g/dL (ref 30.0–36.0)
MCV: 97.3 fL (ref 80.0–100.0)
Platelets: 171 10*3/uL (ref 150–400)
RBC: 3.31 MIL/uL — ABNORMAL LOW (ref 4.22–5.81)
RDW: 13.2 % (ref 11.5–15.5)
WBC: 7.1 10*3/uL (ref 4.0–10.5)
nRBC: 0 % (ref 0.0–0.2)

## 2023-08-30 MED ORDER — FLUCONAZOLE 150 MG PO TABS: 150.0000 mg | ORAL_TABLET | Freq: Every day | ORAL | 0 refills | Status: AC

## 2023-08-30 MED ORDER — CEFADROXIL 500 MG PO CAPS: 500.0000 mg | ORAL_CAPSULE | Freq: Two times a day (BID) | ORAL | 0 refills | Status: AC

## 2023-08-30 MED ORDER — MAGIC MOUTHWASH W/LIDOCAINE: ORAL | 0 refills | Status: AC

## 2023-08-30 NOTE — Discharge Summary (Signed)
 [facility] Physician Discharge Summary   Patient name: Charles Brooks  Admit date:     08/26/2023  Discharge date: 08/30/2023  Attending Physician: Levie Heritage [[id number]]  Discharge Physician: Carollee Herter   PCP: [name], MD  Admitted From: Home  Disposition:  Home  Recommendations for Outpatient Follow-up:  Follow up with PCP in 1-2 weeks Follow up with oncology as scheduled  Home Health:No Equipment/Devices: None  Discharge Condition:Stable CODE STATUS:FULL Diet recommendation:  mechanical soft diet Fluid Restriction: None  Hospital Summary: HPI: Charles Brooks is a 73 y.o. 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  Patient has undergone a month and a half of radiation and has extensive skin effect of the left side of his face and neck.  In addition, he has extensive pain and difficulty swallowing, which has been going on for several weeks.  He has not been eating very much over the last month.  He does have sores in the mouth and has been using lidocaine to try to help, but this is mostly temporary.  Pain is with swallowing any sort of food or liquid.  He was brought in by his spouse due to ongoing weakness   Significant Events: Admitted 08/26/2023 for AKI on CKD   Significant Labs: WBC 12.5, HgB 11.5, plt 191 Na 137, K 5.0, CO2 of 21, BUN 52, scr 4.69, glu 104 UA negative nitrite, large LE, WBC >50 Urine cx proteus mirabilis. Resistant to cipro, macrobid, sulfa  Significant Imaging Studies:   Antibiotic Therapy: Anti-infectives (From admission, onward)    Start     Dose/Rate Route Frequency Ordered Stop   08/27/23 1000  fluconazole (DIFLUCAN) tablet 100 mg       Placed in "Followed by" Linked  Group   100 mg Oral Daily 08/26/23 1735     08/27/23 1000  cefTRIAXone (ROCEPHIN) 1 g in sodium chloride 0.9 % 100 mL IVPB        1 g 200 mL/hr over 30 Minutes Intravenous Every 24 hours 08/27/23 0850     08/26/23 1830  fluconazole (DIFLUCAN) tablet 200 mg       Placed in "Followed by" Linked Group   200 mg Oral  Once 08/26/23 1735 08/26/23 1840       Procedures:   Consultants:    Hospital Course by Problem: * Acute kidney injury superimposed on chronic kidney disease (HCC) On admission. - Likely due to dehydration, for p.o. intake, due to oral thrush difficulty swallowing previous ischemia, -Continuing IV fluids. -Initiating fluconazole, and Magic mouthwash -Urging oral intake. -BUN/creatinine closely -avoid nephrotoxins, hypotension  Lab Results  Component Value Date   CREATININE 3.82 (H) 08/27/2023   CREATININE 4.69 (H) 08/26/2023   CREATININE 1.88 (H) 05/03/2023   08-30-2023 scr back to baseline. Eating well. Ready to go home.   UTI (urinary tract infection) On admission. - UA positive for leukocyte esterase, greater than 50 WBCs. -Will initiate IV antibiotics Rocephin. -Obtain cultures  08-30-2023. Urine cx growing proteus. Sensitive to rocephin. Will treat at discharge with duricef  CKD (chronic kidney disease) stage 3, GFR 30-59 ml/min (HCC) - baseline scr 1.8-2.1 On admission. Acute on chronic kidney disease 3B. -Monitor kidney function closely. -Worsening kidney function due to poor hydration, poor p.o. intake.  08-30-2023 back to baseline of 2.1  Dyslipidemia - low HDL Continue home dose statins  Thrush On admission. - Likely due to radiation therapy. - Continue Magic mouthwash, lidocaine, fluconazole  08-30-2023 DC to home with magic mouthwash. Continue po diflucan.  Squamous cell carcinoma of external ear, left - Continue treatment as an outpatient Currently stable  CAD (coronary artery disease), native coronary artery - Denies any chest pain,  resuming home medication of metoprolol, statins, aspirin,    Discharge Diagnoses:  Principal Problem:   Acute kidney injury superimposed on chronic kidney disease (HCC) Active Problems:   CKD (chronic kidney disease) stage 3, GFR 30-59 ml/min (HCC) - baseline scr 1.8-2.1   UTI (urinary tract infection)   Dyslipidemia - low HDL   CAD (coronary artery disease), native coronary artery   Squamous cell carcinoma of external ear, left   Thrush   Discharge Instructions  Discharge Instructions     Call MD for:  difficulty breathing, headache or visual disturbances   Complete by: As directed    Call MD for:  extreme fatigue   Complete by: As directed    Call MD for:  hives   Complete by: As directed    Call MD for:  persistant dizziness or light-headedness   Complete by: As directed    Call MD for:  persistant nausea and vomiting   Complete by: As directed    Call MD for:  redness, tenderness, or signs of infection (pain, swelling, redness, odor or green/yellow discharge around incision site)   Complete by: As directed    Call MD for:  severe uncontrolled pain   Complete by: As directed    Call MD for:  temperature >100.4   Complete by: As directed    DIET DYS 3   Complete by: As directed    Mechanical soft diet   Fluid consistency: Thin   Discharge instructions   Complete by: As directed    1. Follow up with your primary care provider in 1-2 weeks following discharge from hospital. 2. Follow up with your oncologist as scheduled   Increase activity slowly   Complete by: As directed       Allergies as of 08/30/2023       Reactions   Codeine Hives   All over body        Medication List     STOP taking these medications    lidocaine 2 % solution Commonly known as: XYLOCAINE       TAKE these medications    aspirin EC 81 MG tablet Take 1 tablet (81 mg total) by mouth daily with breakfast.   atorvastatin 40 MG tablet Commonly known as: LIPITOR Take 1 tablet  (40 mg total) by mouth daily.   cefadroxil 500 MG capsule Commonly known as: DURICEF Take 1 capsule (500 mg total) by mouth 2 (two) times daily for 5 days.   cholecalciferol 25 MCG (1000 UNIT) tablet Commonly known as: VITAMIN D3 Take 1,000 Units by mouth daily.   cyanocobalamin 1000 MCG tablet Commonly known as: VITAMIN B12 Take 1,000 mcg by mouth daily.   docusate sodium 50 MG capsule Commonly known as: COLACE Take 50 mg by mouth daily.   fluconazole 150 MG tablet Commonly known as: DIFLUCAN Take 1 tablet (150 mg total) by mouth daily for 7 days.   gabapentin 100 MG capsule Commonly known as: NEURONTIN Take 1 capsule (100 mg total) by mouth at bedtime. What changed: when to take this   levothyroxine  50 MCG tablet Commonly known as: SYNTHROID Take 50 mcg by mouth daily before breakfast.   magic mouthwash w/lidocaine Soln Take 5 mLs by mouth 3 (three) times daily for 7 days, THEN 5 mLs 3 (three) times daily as needed for up to 7 days. Suspension contains equal amounts of Maalox Extra Strength, nystatin, diphenhydramine and lidocaine.. Start taking on: August 30, 2023   metoprolol tartrate 25 MG tablet Commonly known as: LOPRESSOR Take 25 mg by mouth 2 (two) times daily.   nitroGLYCERIN 0.4 MG SL tablet Commonly known as: NITROSTAT Place 1 tablet (0.4 mg total) under the tongue every 5 (five) minutes x 3 doses as needed for chest pain.   SONAFINE EX Apply 1 Application topically in the morning, at noon, in the evening, and at bedtime. Apply to left side of face and left cervical lymph nodes for radiation burn Prescribed by Dr. Basilio Cairo   zinc gluconate 50 MG tablet Take 50 mg by mouth daily.        Allergies  Allergen Reactions   Codeine Hives    All over body    Discharge Exam: Vitals:   08/29/23 2154 08/30/23 0327  BP:  (!) 154/74  Pulse: 78 71  Resp:  17  Temp:  99.8 F (37.7 C)  SpO2:  98%    Physical Exam Vitals and nursing note reviewed.   HENT:     Head: Normocephalic and atraumatic.  Neck:     Comments: Left side of face with radiation erythema. Missing left auricle Cardiovascular:     Rate and Rhythm: Normal rate and regular rhythm.  Pulmonary:     Effort: Pulmonary effort is normal.     Breath sounds: Normal breath sounds.  Abdominal:     General: Bowel sounds are normal. There is no distension.     Palpations: Abdomen is soft.     Tenderness: There is no abdominal tenderness.  Musculoskeletal:     Right lower leg: No edema.     Left lower leg: No edema.  Skin:    General: Skin is warm and dry.     Capillary Refill: Capillary refill takes less than 2 seconds.  Neurological:     General: No focal deficit present.     Mental Status: He is alert. He is disoriented.     The results of significant diagnostics from this hospitalization (including imaging, microbiology, ancillary and laboratory) are listed below for reference.    Microbiology: Recent Results (from the past 240 hours)  Urine Culture (for pregnant, neutropenic or urologic patients or patients with an indwelling urinary catheter)     Status: Abnormal   Collection Time: 08/27/23  8:50 AM   Specimen: Urine, Clean Catch  Result Value Ref Range Status   Specimen Description   Final    URINE, CLEAN CATCH Performed at [facility], [address], [place], [place] [address]    Special Requests   Final    NONE Performed at [facility], [address], [place], [place] [address]    Culture >=100,000 COLONIES/mL PROTEUS MIRABILIS (A)  Final   Report Status 08/29/2023 FINAL  Final   Organism ID, Bacteria PROTEUS MIRABILIS (A)  Final      Susceptibility   Proteus mirabilis - MIC*    AMPICILLIN <=2 SENSITIVE Sensitive     CEFAZOLIN 8 SENSITIVE Sensitive     CEFEPIME <=0.12 SENSITIVE Sensitive     CEFTRIAXONE <=0.25 SENSITIVE Sensitive     CIPROFLOXACIN >=4 RESISTANT Resistant  GENTAMICIN <=1 SENSITIVE Sensitive     IMIPENEM 2 SENSITIVE Sensitive      NITROFURANTOIN 128 RESISTANT Resistant     TRIMETH/SULFA >=320 RESISTANT Resistant     AMPICILLIN/SULBACTAM <=2 SENSITIVE Sensitive     PIP/TAZO <=4 SENSITIVE Sensitive ug/mL    * >=100,000 COLONIES/mL PROTEUS MIRABILIS  Culture, blood (Routine X 2) w Reflex to ID Panel     Status: None (Preliminary result)   Collection Time: 08/27/23 10:13 AM   Specimen: BLOOD  Result Value Ref Range Status   Specimen Description BLOOD BLOOD LEFT HAND  Final   Special Requests   Final    BOTTLES DRAWN AEROBIC AND ANAEROBIC Blood Culture adequate volume   Culture   Final    NO GROWTH 3 DAYS Performed at [facility], [address], [place], [place] [address]    Report Status PENDING  Incomplete  Culture, blood (Routine X 2) w Reflex to ID Panel     Status: None (Preliminary result)   Collection Time: 08/27/23 10:13 AM   Specimen: BLOOD  Result Value Ref Range Status   Specimen Description BLOOD BLOOD LEFT ARM  Final   Special Requests   Final    BOTTLES DRAWN AEROBIC AND ANAEROBIC Blood Culture adequate volume   Culture   Final    NO GROWTH 3 DAYS Performed at [facility], [address], [place], [place] [address]    Report Status PENDING  Incomplete     Labs: Basic Metabolic Panel: Recent Labs  Lab 08/26/23 1443 08/27/23 0453 08/28/23 0349 08/29/23 0429 08/30/23 0430  NA 137 136 137 137 137  K 5.0 4.8 4.9 4.8 4.6  CL 100 106 108 107 106  CO2 21* 21* 21* 21* 22  GLUCOSE 104* 92 93 97 93  BUN 52* 46* 38* 35* 32*  CREATININE 4.69* 3.82* 2.94* 2.35* 2.11*  CALCIUM 9.6 8.4* 8.5* 8.7* 8.6*   CBC: Recent Labs  Lab 08/26/23 1443 08/27/23 0453 08/28/23 0349 08/29/23 0429 08/30/23 0430  WBC 12.5* 7.5 5.9 6.0 7.1  NEUTROABS 11.4*  --   --   --   --   HGB 11.5* 10.3* 9.4* 10.1* 10.1*  HCT 36.9* 32.5* 29.6* 30.9* 32.2*  MCV 100.3* 100.0 98.7 96.6 97.3  PLT 191 153 157 162 171   Urinalysis    Component Value Date/Time   COLORURINE YELLOW 08/26/2023 1850   APPEARANCEUR  CLOUDY (A) 08/26/2023 1850   APPEARANCEUR Clear 12/23/2021 1013   LABSPEC 1.015 08/26/2023 1850   PHURINE 6.0 08/26/2023 1850   GLUCOSEU NEGATIVE 08/26/2023 1850   HGBUR MODERATE (A) 08/26/2023 1850   BILIRUBINUR NEGATIVE 08/26/2023 1850   BILIRUBINUR Negative 12/23/2021 1013   KETONESUR NEGATIVE 08/26/2023 1850   PROTEINUR 100 (A) 08/26/2023 1850   NITRITE NEGATIVE 08/26/2023 1850   LEUKOCYTESUR LARGE (A) 08/26/2023 1850   Sepsis Labs Recent Labs  Lab 08/27/23 0453 08/28/23 0349 08/29/23 0429 08/30/23 0430  WBC 7.5 5.9 6.0 7.1    Procedures/Studies: No results found.  Time coordinating discharge: 45 mins  SIGNED:  Carollee Herter, DO [facility] 08/30/23, 10:32 AM

## 2023-08-30 NOTE — Subjective & Objective (Signed)
 Pt seen and examined. Met with wife at bedside. Getting more confused the longer he is in the hospital. Discussed with wife that best course would be to discharge him back to  home environment. Orient him back to day/night.  Recommend pt be exposed to indirect sunlight today to re-orient him back to day/nigh.  Stable for DC.

## 2023-08-30 NOTE — Progress Notes (Signed)
 PROGRESS NOTE    Charles Brooks  ZOX:[id number] DOB: 04-14-1951 DOA: 08/26/2023 PCP: [name], MD  Subjective: Pt seen and examined. Met with wife at bedside. Getting more confused the longer he is in the hospital. Discussed with wife that best course would be to discharge him back to  home environment. Orient him back to day/night.  Recommend pt be exposed to indirect sunlight today to re-orient him back to day/nigh.  Stable for DC.   Hospital Course: HPI: Charles Brooks is a 73 y.o. 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  Patient has undergone a month and a half of radiation and has extensive skin effect of the left side of his face and neck.  In addition, he has extensive pain and difficulty swallowing, which has been going on for several weeks.  He has not been eating very much over the last month.  He does have sores in the mouth and has been using lidocaine to try to help, but this is mostly temporary.  Pain is with swallowing any sort of food or liquid.  He was brought in by his spouse due to ongoing weakness   Significant Events: Admitted 08/26/2023 for AKI on CKD   Significant Labs: WBC 12.5, HgB 11.5, plt 191 Na 137, K 5.0, CO2 of 21, BUN 52, scr 4.69, glu 104 UA negative nitrite, large LE, WBC >50 Urine cx proteus mirabilis. Resistant to cipro, macrobid, sulfa  Significant Imaging Studies:   Antibiotic Therapy: Anti-infectives (From admission, onward)    Start     Dose/Rate Route Frequency Ordered Stop   08/27/23 1000  fluconazole (DIFLUCAN) tablet 100 mg       Placed in "Followed by" Linked Group   100 mg Oral Daily 08/26/23 1735     08/27/23 1000  cefTRIAXone (ROCEPHIN) 1 g in sodium chloride 0.9 % 100  mL IVPB        1 g 200 mL/hr over 30 Minutes Intravenous Every 24 hours 08/27/23 0850     08/26/23 1830  fluconazole (DIFLUCAN) tablet 200 mg       Placed in "Followed by" Linked Group   200 mg Oral  Once 08/26/23 1735 08/26/23 1840       Procedures:   Consultants:     Assessment and Plan: * Acute kidney injury superimposed on chronic kidney disease (HCC) On admission. - Likely due to dehydration, for p.o. intake, due to oral thrush difficulty swallowing previous ischemia, -Continuing IV fluids. -Initiating fluconazole, and Magic mouthwash -Urging oral intake. -BUN/creatinine closely -avoid nephrotoxins, hypotension  Lab Results  Component Value Date   CREATININE 3.82 (H) 08/27/2023   CREATININE 4.69 (H) 08/26/2023   CREATININE 1.88 (H) 05/03/2023   08-30-2023 scr back to baseline. Eating well. Ready to go home.   UTI (urinary tract infection) On admission. - UA positive for leukocyte esterase, greater than 50 WBCs. -Will initiate IV antibiotics Rocephin. -Obtain cultures  08-30-2023. Urine cx growing proteus. Sensitive to rocephin. Will treat at discharge with duricef  CKD (chronic kidney disease) stage 3, GFR 30-59 ml/min (HCC) - baseline scr 1.8-2.1 On admission. Acute on chronic kidney disease 3B. -Monitor kidney function closely. -Worsening kidney function due to poor hydration, poor p.o. intake.  08-30-2023 back to baseline of 2.1  Dyslipidemia -  low HDL Continue home dose statins  Thrush On admission. - Likely due to radiation therapy. - Continue Magic mouthwash, lidocaine, fluconazole  08-30-2023 DC to home with magic mouthwash. Continue po diflucan.  Squamous cell carcinoma of external ear, left - Continue treatment as an outpatient Currently stable  CAD (coronary artery disease), native coronary artery - Denies any chest pain, resuming home medication of metoprolol, statins, aspirin,   DVT prophylaxis: enoxaparin (LOVENOX) injection 30 mg Start:  08/26/23 2200    Code Status: Full Code Family Communication: discussed with wife at bedside Disposition Plan: return home Reason for continuing need for hospitalization: medically stable for DC.  Objective: Vitals:   08/29/23 1330 08/29/23 2022 08/29/23 2154 08/30/23 0327  BP: 131/62 (!) 140/79  (!) 154/74  Pulse: 63 (!) 55 78 71  Resp:  17  17  Temp: 98.6 F (37 C) 99 F (37.2 C)  99.8 F (37.7 C)  TempSrc: Oral Oral  Oral  SpO2: 99% 98%  98%  Weight:      Height:        Intake/Output Summary (Last 24 hours) at 08/30/2023 1031 Last data filed at 08/30/2023 0830 Gross per 24 hour  Intake 1389.88 ml  Output 1350 ml  Net 39.88 ml   Filed Weights   08/26/23 1404 08/26/23 1717  Weight: 82.6 kg 81.5 kg    Examination:  Physical Exam Vitals and nursing note reviewed.  HENT:     Head: Normocephalic and atraumatic.  Neck:     Comments: Left side of face with radiation erythema. Missing left auricle Cardiovascular:     Rate and Rhythm: Normal rate and regular rhythm.  Pulmonary:     Effort: Pulmonary effort is normal.     Breath sounds: Normal breath sounds.  Abdominal:     General: Bowel sounds are normal. There is no distension.     Palpations: Abdomen is soft.     Tenderness: There is no abdominal tenderness.  Musculoskeletal:     Right lower leg: No edema.     Left lower leg: No edema.  Skin:    General: Skin is warm and dry.     Capillary Refill: Capillary refill takes less than 2 seconds.  Neurological:     General: No focal deficit present.     Mental Status: He is alert. He is disoriented.     Data Reviewed: I have personally reviewed following labs and imaging studies  CBC: Recent Labs  Lab 08/26/23 1443 08/27/23 0453 08/28/23 0349 08/29/23 0429 08/30/23 0430  WBC 12.5* 7.5 5.9 6.0 7.1  NEUTROABS 11.4*  --   --   --   --   HGB 11.5* 10.3* 9.4* 10.1* 10.1*  HCT 36.9* 32.5* 29.6* 30.9* 32.2*  MCV 100.3* 100.0 98.7 96.6 97.3  PLT 191 153 157  162 171   Basic Metabolic Panel: Recent Labs  Lab 08/26/23 1443 08/27/23 0453 08/28/23 0349 08/29/23 0429 08/30/23 0430  NA 137 136 137 137 137  K 5.0 4.8 4.9 4.8 4.6  CL 100 106 108 107 106  CO2 21* 21* 21* 21* 22  GLUCOSE 104* 92 93 97 93  BUN 52* 46* 38* 35* 32*  CREATININE 4.69* 3.82* 2.94* 2.35* 2.11*  CALCIUM 9.6 8.4* 8.5* 8.7* 8.6*   GFR: Estimated Creatinine Clearance: 34.2 mL/min (A) (by C-G formula based on SCr of 2.11 mg/dL (H)).  Recent Results (from the past 240 hours)  Urine Culture (for pregnant, neutropenic or urologic patients or patients with  an indwelling urinary catheter)     Status: Abnormal   Collection Time: 08/27/23  8:50 AM   Specimen: Urine, Clean Catch  Result Value Ref Range Status   Specimen Description   Final    URINE, CLEAN CATCH Performed at [facility], [address] [place], [place] [address]    Special Requests   Final    NONE Performed at [facility], [address] [place], [place] [address]    Culture >=100,000 COLONIES/mL PROTEUS MIRABILIS (A)  Final   Report Status 08/29/2023 FINAL  Final   Organism ID, Bacteria PROTEUS MIRABILIS (A)  Final      Susceptibility   Proteus mirabilis - MIC*    AMPICILLIN <=2 SENSITIVE Sensitive     CEFAZOLIN 8 SENSITIVE Sensitive     CEFEPIME <=0.12 SENSITIVE Sensitive     CEFTRIAXONE <=0.25 SENSITIVE Sensitive     CIPROFLOXACIN >=4 RESISTANT Resistant     GENTAMICIN <=1 SENSITIVE Sensitive     IMIPENEM 2 SENSITIVE Sensitive     NITROFURANTOIN 128 RESISTANT Resistant     TRIMETH/SULFA >=320 RESISTANT Resistant     AMPICILLIN/SULBACTAM <=2 SENSITIVE Sensitive     PIP/TAZO <=4 SENSITIVE Sensitive ug/mL    * >=100,000 COLONIES/mL PROTEUS MIRABILIS  Culture, blood (Routine X 2) w Reflex to ID Panel     Status: None (Preliminary result)   Collection Time: 08/27/23 10:13 AM   Specimen: BLOOD  Result Value Ref Range Status   Specimen Description BLOOD BLOOD LEFT HAND  Final   Special Requests    Final    BOTTLES DRAWN AEROBIC AND ANAEROBIC Blood Culture adequate volume   Culture   Final    NO GROWTH 3 DAYS Performed at [facility], [address], [place], [place] [address]    Report Status PENDING  Incomplete  Culture, blood (Routine X 2) w Reflex to ID Panel     Status: None (Preliminary result)   Collection Time: 08/27/23 10:13 AM   Specimen: BLOOD  Result Value Ref Range Status   Specimen Description BLOOD BLOOD LEFT ARM  Final   Special Requests   Final    BOTTLES DRAWN AEROBIC AND ANAEROBIC Blood Culture adequate volume   Culture   Final    NO GROWTH 3 DAYS Performed at [facility], [address] [place], [place] [address]    Report Status PENDING  Incomplete   Scheduled Meds:  acidophilus  2 capsule Oral TID   aspirin EC  81 mg Oral Q breakfast   atorvastatin  40 mg Oral Daily   docusate  50 mg Oral QHS   enoxaparin (LOVENOX) injection  30 mg Subcutaneous Q24H   fluconazole  100 mg Oral Daily   gabapentin  100 mg Oral BID   levothyroxine  50 mcg Oral QAC breakfast   magic mouthwash w/lidocaine  5 mL Oral TID   metoprolol tartrate  25 mg Oral BID   nystatin  5 mL Oral QID   Sonafine  1 Application Topical QID   Continuous Infusions:  cefTRIAXone (ROCEPHIN)  IV Stopped (08/29/23 0942)     LOS: 4 days   Time spent: 40 minutes  Carollee Herter, DO  [facility]  08/30/2023, 10:31 AM

## 2023-08-30 NOTE — Progress Notes (Signed)
 Mobility Specialist Progress Note:    08/30/23 1025  Mobility  Activity Ambulated with assistance in hallway;Transferred from bed to chair;Stood at bedside  Level of Assistance Contact guard assist, steadying assist  Assistive Device Front wheel walker  Distance Ambulated (ft) 180 ft  Range of Motion/Exercises Active;All extremities  Activity Response Tolerated well  Mobility Referral Yes  Mobility visit 1 Mobility  Mobility Specialist Start Time (ACUTE ONLY) 1000  Mobility Specialist Stop Time (ACUTE ONLY) 1025  Mobility Specialist Time Calculation (min) (ACUTE ONLY) 25 min   Pt received in bed, agreeable to mobility. Required CGA to stand and ambulate with RW. Tolerated well, asx throughout. Returned to room, left pt in chair. Wife at bedside, all needs met.   Lawerance Bach Mobility Specialist Please contact via Special educational needs teacher or  Rehab office at [phone]

## 2023-09-01 LAB — CULTURE, BLOOD (ROUTINE X 2)
Special Requests: ADEQUATE
Special Requests: ADEQUATE

## 2023-09-08 DIAGNOSIS — Z299 Encounter for prophylactic measures, unspecified: Secondary | ICD-10-CM | POA: Diagnosis not present

## 2023-09-08 DIAGNOSIS — N390 Urinary tract infection, site not specified: Secondary | ICD-10-CM | POA: Diagnosis not present

## 2023-09-08 DIAGNOSIS — Z09 Encounter for follow-up examination after completed treatment for conditions other than malignant neoplasm: Secondary | ICD-10-CM | POA: Diagnosis not present

## 2023-09-08 DIAGNOSIS — K1233 Oral mucositis (ulcerative) due to radiation: Secondary | ICD-10-CM | POA: Diagnosis not present

## 2023-09-08 DIAGNOSIS — I10 Essential (primary) hypertension: Secondary | ICD-10-CM | POA: Diagnosis not present

## 2023-09-08 DIAGNOSIS — N184 Chronic kidney disease, stage 4 (severe): Secondary | ICD-10-CM | POA: Diagnosis not present

## 2023-09-11 DIAGNOSIS — E875 Hyperkalemia: Secondary | ICD-10-CM | POA: Diagnosis not present

## 2023-09-12 ENCOUNTER — Ambulatory Visit: Payer: Self-pay | Admitting: Physical Therapy

## 2023-09-19 ENCOUNTER — Ambulatory Visit
Admission: RE | Admit: 2023-09-19 | Discharge: 2023-09-19 | Disposition: A | Discharge status: Home / Self Care | Source: Ambulatory Visit | Attending: Radiology | Admitting: Radiology

## 2023-09-19 ENCOUNTER — Encounter: Payer: Self-pay | Admitting: Radiology

## 2023-09-19 VITALS — BP 125/66 | HR 60 | Temp 97.5°F | Resp 18 | Ht 72.0 in | Wt 180.1 lb

## 2023-09-19 DIAGNOSIS — C44229 Squamous cell carcinoma of skin of left ear and external auricular canal: Secondary | ICD-10-CM

## 2023-09-19 NOTE — Progress Notes (Signed)
   Charles Brooks, Charles Brooks is here for a routine follow-up appointment today with Bryan Lemma PA-C   Site:Left external ear    Treatment Completion Date: 2023-08-25  Pain issues, if any: Denies Using a feeding tube?: Denies Weight changes, if any:  Wt Readings from Last 3 Encounters: 09/19/2023      182.0 lb  08/26/23 179 lb 10.8 oz (81.5 kg)  08/01/23 189 lb 6.4 oz (85.9 kg)       Swallowing issues, if any: Denies Smoking or chewing tobacco? Denies Using fluoride toothpaste daily? Yes Last ENT visit was on: November 2024 Other notable issues, if any: He wants to get a dental clearance. Also, he reports that his hearing has improved but not as well as would would like.  BP 125/66 (BP Location: Left Arm, Patient Position: Sitting)   Pulse 60   Temp (!) 97.5 F (36.4 C) (Temporal)   Resp 18   Ht 6' (1.829 m)   Wt 180 lb 2 oz (81.7 kg)   SpO2 100%   BMI 24.43 kg/m

## 2023-09-19 NOTE — Progress Notes (Addendum)
 Radiation Oncology         [phone] ________________________________  Name: Charles Brooks MRN: [id number]  Date: 09/19/2023  DOB: 1950/09/22  Follow-Up Visit Note  CC: Charles Specking, MD  Charles Specking, MD  Diagnosis and Prior Radiotherapy:       ICD-10-CM   1. Squamous cell carcinoma of external ear, left  C44.229 CT Soft Tissue Neck W Contrast      ==========DELIVERED PLANS==========  First Treatment Date: 2023-07-27 Last Treatment Date: 2023-08-25   Plan Name: HN_L Site: Neck Left Technique: IMRT Mode: Photon Dose Per Fraction: 2.5 Gy Prescribed Dose (Delivered / Prescribed): 50 Gy / 50 Gy Prescribed Fxs (Delivered / Prescribed): 20 / 20   Locally advanced Invasive well to moderately differentiated keratinizing squamous cell carcinoma of the left ear extending to the underlying ear cartilage: s/p left auriculectomy and adjuvant radiation completed on 08/25/2023.   CHIEF COMPLAINT:  Here for follow-up and surveillance of squamous cell carcinoma of the left ear.  Narrative:  The patient returns today for routine follow-up. He completed the treatment on 08/25/2023.       Pain issues, if any: Denies Using a feeding tube?: Denies Weight changes, if any: Has gained weight since being 160lbs after completing his XRT.  Wt Readings from Last 3 Encounters:  09/19/23 180 lb 2 oz (81.7 kg)  08/26/23 179 lb 10.8 oz (81.5 kg)  08/01/23 189 lb 6.4 oz (85.9 kg)  Swallowing issues, if any: Denies, he is able to tolerate a wide variety of foods. He does experience a dry mouth, so dry, tough foods are more difficult for him to get down.  Smoking or chewing tobacco? Denies Using fluoride toothpaste daily? Yes Last ENT visit was on: November 2024 Other notable issues, if any: He wants to get a dental clearance. Also, he reports that his hearing has improved but not as well as would would like.              ALLERGIES:  is allergic to codeine.  Meds: Current Outpatient Medications   Medication Sig Dispense Refill   aspirin 81 MG EC tablet Take 1 tablet (81 mg total) by mouth daily with breakfast. 30 tablet 1   atorvastatin (LIPITOR) 40 MG tablet Take 1 tablet (40 mg total) by mouth daily. 10 tablet 0   cholecalciferol (VITAMIN D3) 25 MCG (1000 UNIT) tablet Take 1,000 Units by mouth daily.     docusate sodium (COLACE) 50 MG capsule Take 50 mg by mouth daily.     gabapentin (NEURONTIN) 100 MG capsule Take 1 capsule (100 mg total) by mouth at bedtime. (Patient taking differently: Take 100 mg by mouth 2 (two) times daily.)     levothyroxine (SYNTHROID) 50 MCG tablet Take 50 mcg by mouth daily before breakfast.     metoprolol tartrate (LOPRESSOR) 25 MG tablet Take 25 mg by mouth 2 (two) times daily.     nitroGLYCERIN (NITROSTAT) 0.4 MG SL tablet Place 1 tablet (0.4 mg total) under the tongue every 5 (five) minutes x 3 doses as needed for chest pain. 25 tablet 12   vitamin B-12 (CYANOCOBALAMIN) 1000 MCG tablet Take 1,000 mcg by mouth daily.     Wound Dressings (SONAFINE EX) Apply 1 Application topically in the morning, at noon, in the evening, and at bedtime. Apply to left side of face and left cervical lymph nodes for radiation burn Prescribed by Dr. Basilio Cairo     zinc gluconate 50 MG tablet Take 50 mg by  mouth daily.     magic mouthwash w/lidocaine SOLN Take 5 mLs by mouth 3 (three) times daily for 7 days, THEN 5 mLs 3 (three) times daily as needed for up to 7 days. Suspension contains equal amounts of Maalox Extra Strength, nystatin, diphenhydramine and lidocaine.. 300 mL 0   No current facility-administered medications for this encounter.    Physical Findings: The patient is in no acute distress. Patient is alert and oriented. Wt Readings from Last 3 Encounters:  09/19/23 180 lb 2 oz (81.7 kg)  08/26/23 179 lb 10.8 oz (81.5 kg)  08/01/23 189 lb 6.4 oz (85.9 kg)    height is 6' (1.829 m) and weight is 180 lb 2 oz (81.7 kg). His temporal temperature is 97.5 F (36.4 C)  (abnormal). His blood pressure is 125/66 and his pulse is 60. His respiration is 18 and oxygen saturation is 100%. .  General: Alert and oriented, in no acute distress HEENT: Head is normocephalic. Extraocular movements are intact. Oropharynx is notable for Oropharynx is notable for no lesions. Missing many teeth and has upper dentures.  Missing 98% of Left external ear. No sign of local progression. Tympanic membranes are clear bilaterally with no impacted cerumen.  Neck: Neck is notable for no palpable adenopathy Skin: Skin in treatment fields shows satisfactory healing within the treatment field Heart: Regular in rate and rhythm with no murmurs, rubs, or gallops. Chest: Clear to auscultation bilaterally, with no rhonchi, wheezes, or rales. Abdomen: Soft, nontender, nondistended, with no rigidity or guarding. Extremities: No cyanosis or edema. Lymphatics: see Neck Exam Psychiatric: Judgment and insight are intact. Affect is appropriate.   Lab Findings: Lab Results  Component Value Date   WBC 7.1 08/30/2023   HGB 10.1 (L) 08/30/2023   HCT 32.2 (L) 08/30/2023   MCV 97.3 08/30/2023   PLT 171 08/30/2023    Lab Results  Component Value Date   TSH 3.423 08/14/2014    Radiographic Findings: No results found.  Impression/Plan:    Locally advanced Invasive well to moderately differentiated keratinizing squamous cell carcinoma of the left ear extending to the underlying ear cartilage: s/p left auriculectomy and adjuvant radiation completed on 08/25/2023.   The patient is doing well overall today and he's recovered well from his radiation treatment. Patient notes a dry mouth, but is able to tolerate a wide range of food and is maintaining his weight well.   CT of the neck with contrast in 3 months with an office visit to follow. Patient was encouraged to call with any questions or concerns in the meantime.   We reviewed his radiation plans today. His tooth roots received well below 30  Gy, no concerns with teeth extractions moving forward.   Hearing loss  This is unlikely related to the radiation given the area treated. His hearing could be affected from his auriculectomy. Referral to audiologist placed today to help address this issue.     On date of service, in total, I spent 20 minutes on this encounter. Patient was seen in person. _____________________________________    Bryan Lemma, PA-C

## 2023-09-20 NOTE — Progress Notes (Signed)
 Oncology Nurse Navigator Documentation   At [facility] ([facility]) request, I have scheduled Mr. Bowns with Audiology at [facility]. They will see him tomorrow at 3:40 pm. I called his number and spoke to his wife who is agreeable to the appointment date/time.  Hedda Slade RN, BSN, OCN Head & Neck Oncology Nurse Navigator [facility] at [facility] Phone # [phone]  Fax # [phone]

## 2023-09-21 DIAGNOSIS — H903 Sensorineural hearing loss, bilateral: Secondary | ICD-10-CM | POA: Diagnosis not present

## 2023-10-05 DIAGNOSIS — D225 Melanocytic nevi of trunk: Secondary | ICD-10-CM | POA: Diagnosis not present

## 2023-10-05 DIAGNOSIS — C4442 Squamous cell carcinoma of skin of scalp and neck: Secondary | ICD-10-CM | POA: Diagnosis not present

## 2023-10-05 DIAGNOSIS — L821 Other seborrheic keratosis: Secondary | ICD-10-CM | POA: Diagnosis not present

## 2023-10-05 DIAGNOSIS — C44612 Basal cell carcinoma of skin of right upper limb, including shoulder: Secondary | ICD-10-CM | POA: Diagnosis not present

## 2023-10-05 DIAGNOSIS — C44519 Basal cell carcinoma of skin of other part of trunk: Secondary | ICD-10-CM | POA: Diagnosis not present

## 2023-10-26 ENCOUNTER — Telehealth: Payer: Self-pay | Admitting: *Deleted

## 2023-10-26 DIAGNOSIS — C44612 Basal cell carcinoma of skin of right upper limb, including shoulder: Secondary | ICD-10-CM | POA: Diagnosis not present

## 2023-10-26 DIAGNOSIS — Z08 Encounter for follow-up examination after completed treatment for malignant neoplasm: Secondary | ICD-10-CM | POA: Diagnosis not present

## 2023-10-26 DIAGNOSIS — Z85828 Personal history of other malignant neoplasm of skin: Secondary | ICD-10-CM | POA: Diagnosis not present

## 2023-10-26 DIAGNOSIS — D0462 Carcinoma in situ of skin of left upper limb, including shoulder: Secondary | ICD-10-CM | POA: Diagnosis not present

## 2023-10-26 NOTE — Telephone Encounter (Signed)
 CALLED PATIENT TO INFORM OF CT FOR 12-19-23- ARRIVAL TIME- 12:45 PM @ [facility], NO RESTRICTIONS TO SCAN, PATIENT TO RECEIVE RESULTS FROM ELLIE MUIR ON 12/26/23 @ 11 AM, LVM FOR A RETURN CALL

## 2023-11-01 DIAGNOSIS — I700 Atherosclerosis of aorta: Secondary | ICD-10-CM | POA: Diagnosis not present

## 2023-11-01 DIAGNOSIS — Z1331 Encounter for screening for depression: Secondary | ICD-10-CM | POA: Diagnosis not present

## 2023-11-01 DIAGNOSIS — Z1339 Encounter for screening examination for other mental health and behavioral disorders: Secondary | ICD-10-CM | POA: Diagnosis not present

## 2023-11-01 DIAGNOSIS — I739 Peripheral vascular disease, unspecified: Secondary | ICD-10-CM | POA: Diagnosis not present

## 2023-11-01 DIAGNOSIS — Z0000 Encounter for general adult medical examination without abnormal findings: Secondary | ICD-10-CM | POA: Diagnosis not present

## 2023-11-01 DIAGNOSIS — Z7189 Other specified counseling: Secondary | ICD-10-CM | POA: Diagnosis not present

## 2023-11-01 DIAGNOSIS — E039 Hypothyroidism, unspecified: Secondary | ICD-10-CM | POA: Diagnosis not present

## 2023-11-01 DIAGNOSIS — Z299 Encounter for prophylactic measures, unspecified: Secondary | ICD-10-CM | POA: Diagnosis not present

## 2023-11-01 DIAGNOSIS — I10 Essential (primary) hypertension: Secondary | ICD-10-CM | POA: Diagnosis not present

## 2023-11-23 DIAGNOSIS — D0462 Carcinoma in situ of skin of left upper limb, including shoulder: Secondary | ICD-10-CM | POA: Diagnosis not present

## 2023-11-23 DIAGNOSIS — L0100 Impetigo, unspecified: Secondary | ICD-10-CM | POA: Diagnosis not present

## 2023-11-23 DIAGNOSIS — Z08 Encounter for follow-up examination after completed treatment for malignant neoplasm: Secondary | ICD-10-CM | POA: Diagnosis not present

## 2023-11-23 DIAGNOSIS — Z85828 Personal history of other malignant neoplasm of skin: Secondary | ICD-10-CM | POA: Diagnosis not present

## 2023-12-19 ENCOUNTER — Ambulatory Visit (HOSPITAL_COMMUNITY)
Admission: RE | Admit: 2023-12-19 | Discharge: 2023-12-19 | Disposition: A | Discharge status: Home / Self Care | Source: Ambulatory Visit | Attending: Radiology | Admitting: Radiology

## 2023-12-19 DIAGNOSIS — I700 Atherosclerosis of aorta: Secondary | ICD-10-CM | POA: Diagnosis not present

## 2023-12-19 DIAGNOSIS — C44229 Squamous cell carcinoma of skin of left ear and external auricular canal: Secondary | ICD-10-CM | POA: Insufficient documentation

## 2023-12-19 DIAGNOSIS — M47812 Spondylosis without myelopathy or radiculopathy, cervical region: Secondary | ICD-10-CM | POA: Diagnosis not present

## 2023-12-19 LAB — POCT I-STAT CREATININE: Creatinine, Ser: 1.9 mg/dL — ABNORMAL HIGH (ref 0.61–1.24)

## 2023-12-19 MED ORDER — IOHEXOL 300 MG/ML  SOLN: 75.0000 mL | Freq: Once | INTRAMUSCULAR | Status: DC | PRN

## 2023-12-19 MED ORDER — IOHEXOL 300 MG/ML  SOLN
60.0000 mL | Freq: Once | INTRAMUSCULAR | Status: AC | PRN
  Administered 2023-12-19: 60 mL via INTRAVENOUS

## 2023-12-26 ENCOUNTER — Ambulatory Visit
Admission: RE | Admit: 2023-12-26 | Discharge: 2023-12-26 | Disposition: A | Discharge status: Home / Self Care | Payer: Self-pay | Source: Ambulatory Visit | Attending: Radiology | Admitting: Radiology

## 2023-12-26 ENCOUNTER — Encounter: Payer: Self-pay | Admitting: Radiology

## 2023-12-26 VITALS — BP 127/69 | HR 56 | Temp 97.8°F | Resp 18 | Ht 72.0 in | Wt 173.8 lb

## 2023-12-26 DIAGNOSIS — C44229 Squamous cell carcinoma of skin of left ear and external auricular canal: Secondary | ICD-10-CM

## 2023-12-26 NOTE — Progress Notes (Signed)
 Radiation Oncology         [phone] ________________________________  Name: Charles Brooks MRN: [id number]  Date: 12/26/2023  DOB: 08-Sep-1950  Follow-Up Visit Note  CC: Charles Leta NOVAK, MD  Charles Leta NOVAK, MD  Diagnosis and Prior Radiotherapy:       ICD-10-CM   1. 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   CHIEF COMPLAINT:  Here for follow-up and surveillance of squamous cell carcinoma of the left ear.   Narrative:  The patient returns today for routine follow-up and to review recent imaging. He completed the treatment on 08/25/2023.      CT of the neck from 12/19/2023 demonstrates no evidence of residual/recurrent mass at the site and no pathologically enlarged lymph nodes within the neck.   Pain issues, if any: Denies Using a feeding tube?: N/A Weight changes, if any: Denies   Wt Readings from Last 3 Encounters:  12/26/23 - 173 lb 12.8 oz 09/19/23 - 179 lb 10.8 oz 08/01/23 -  189 lb 6.4 oz   Swallowing issues, if any: He states that when he swallows his throat feels sore.He does experience dry mouth. Smoking or chewing tobacco? Denies Using fluoride toothpaste daily? 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 She had a knee replacement and once she is healed he will look into getting a knee  replacement himself.           ALLERGIES:  is allergic to codeine.  Meds: Current Outpatient Medications  Medication Sig Dispense Refill   aspirin  81 MG EC tablet Take 1 tablet (81 mg total) by mouth daily with breakfast. 30 tablet 1   atorvastatin  (LIPITOR ) 40 MG tablet Take 1 tablet (40 mg total) by mouth daily. 10 tablet 0   cholecalciferol (VITAMIN D3) 25 MCG (1000 UNIT) tablet Take 1,000 Units by mouth daily.     docusate sodium  (COLACE) 50 MG capsule Take 50 mg by mouth daily.     gabapentin  (NEURONTIN ) 100 MG capsule Take 1 capsule (100 mg total) by mouth at bedtime.     levothyroxine  (SYNTHROID ) 50 MCG tablet Take 50 mcg by mouth daily before breakfast.     magic mouthwash w/lidocaine  SOLN Take 5 mLs by mouth 3 (three) times daily for 7 days, THEN 5 mLs 3 (three) times daily as needed for up to 7 days. Suspension contains equal amounts of Maalox Extra Strength, nystatin , diphenhydramine  and lidocaine .. 300 mL 0   metoprolol  tartrate (LOPRESSOR ) 25 MG tablet Take 25 mg by mouth 2 (two) times daily.     nitroGLYCERIN  (NITROSTAT ) 0.4 MG SL tablet Place 1 tablet (0.4 mg total) under the tongue every 5 (five) minutes x 3 doses as needed for chest pain. 25 tablet 12   vitamin B-12 (CYANOCOBALAMIN ) 1000 MCG tablet Take 1,000 mcg by mouth daily.     zinc  gluconate 50  MG tablet Take 50 mg by mouth daily.     Wound Dressings (SONAFINE EX) Apply 1 Application topically in the morning, at noon, in the evening, and at bedtime. Apply to left side of face and left cervical lymph nodes for radiation burn Prescribed by Dr. Izell (Patient not taking: Reported on 12/26/2023)     No current facility-administered medications for this encounter.    Physical Findings:  The patient is in no acute distress. Patient is alert and oriented. Wt Readings from Last 3 Encounters:  12/26/23 173 lb 12.8 oz (78.8 kg)  09/19/23 180 lb 2 oz (81.7 kg)  08/26/23 179 lb 10.8 oz (81.5 kg)    height is 6' (1.829 m) and  weight is 173 lb 12.8 oz (78.8 kg). His temperature is 97.8 F (36.6 C). His blood pressure is 127/69 and his pulse is 56 (abnormal). His respiration is 18 and oxygen saturation is 99%. .  General: Alert and oriented, in no acute distress HEENT: Head is normocephalic. Extraocular movements are intact. Oropharynx is notable for Oropharynx is notable for no lesions. Missing many teeth and has upper dentures.  Missing 98% of Left external ear. No sign of local progression.  Neck: Neck is notable for no palpable adenopathy Skin: Skin in treatment fields shows satisfactory healing within the treatment field Heart: Regular in rate and rhythm with no murmurs, rubs, or gallops. Chest: Clear to auscultation bilaterally, with no rhonchi, wheezes, or rales. Abdomen: Soft, nontender, nondistended, with no rigidity or guarding. Extremities: No cyanosis or edema. Lymphatics: see Neck Exam Psychiatric: Judgment and insight are intact. Affect is appropriate.   Lab Findings: Lab Results  Component Value Date   WBC 7.1 08/30/2023   HGB 10.1 (L) 08/30/2023   HCT 32.2 (L) 08/30/2023   MCV 97.3 08/30/2023   PLT 171 08/30/2023    Lab Results  Component Value Date   TSH 3.423 08/14/2014    Radiographic Findings: CT Soft Tissue Neck W Contrast Result Date: 12/24/2023 CLINICAL DATA:  Provided history: Head/neck cancer, monitor. Squamous cell carcinoma of external ear, left. Additional history obtained from electronic MEDICAL RECORD NUMBERSquamous cell carcinoma of left ear extending to underlying ear cartilage status post auriculectomy and adjuvant radiation completed on 08/25/2023. EXAM: CT NECK WITH CONTRAST TECHNIQUE: Multidetector CT imaging of the neck was performed using the standard protocol following the bolus administration of intravenous contrast. RADIATION DOSE REDUCTION: This exam was performed according to the departmental dose-optimization program which includes automated exposure control, adjustment  of the mA and/or kV according to patient size and/or use of iterative reconstruction technique. CONTRAST:  60mL OMNIPAQUE  IOHEXOL  300 MG/ML  SOLN COMPARISON:  Neck CT 04/20/2023. FINDINGS: Pharynx and larynx: No appreciable swelling or mass within the oral cavity, pharynx or larynx. Salivary glands: No inflammation, mass, or stone. Thyroid : Unremarkable. Lymph nodes: No pathologically enlarged lymph nodes identified within the neck. Vascular: The major vascular structures of the neck are patent. Atherosclerotic plaque within the visible thoracic aorta, proximal major branch vessels of the neck and within the carotid and vertebral arteries. Limited intracranial: No evidence of an acute intracranial abnormality within the field of view. Visualized orbits: Incompletely imaged. No orbital mass or acute orbital finding at the imaged levels. Mastoids and visualized paranasal sinuses: The frontal sinuses are excluded from the field of view. Portions of the ethmoid and sphenoid sinuses are also excluded from the field of view superiorly. Trace mucosal thickening within the bilateral maxillary sinuses. No significant mastoid effusion. Skeleton: Cervical spondylosis.  Mild grade 1 anterolisthesis at C4-C5, C5-C6 and C7-T1. Slight grade 1 retrolisthesis at C6-C7. Numerous absent teeth. No acute fracture or aggressive osseous lesion. Upper chest: No consolidation within the imaged lung apices. Brooks: Left auriculectomy has been performed since the neck CT of 04/20/2023. No evidence of a residual/recurrent mass at this site. IMPRESSION: 1. Left auriculectomy has been performed since the neck CT of 04/20/2023. No evidence of a residual/recurrent mass at this site. 2. No pathologically enlarged lymph nodes within the neck. 3. Aortic Atherosclerosis (ICD10-I70.0). Electronically Signed   By: Rockey Childs D.O.   On: 12/24/2023 14:02    Impression/Plan:    Locally advanced Invasive well to moderately differentiated keratinizing  squamous cell carcinoma of the left ear extending to the underlying ear cartilage: s/p left auriculectomy and adjuvant radiation completed on 08/25/2023.  The patient is doing well overall today and he's recovered well from his radiation treatment. Impression from his most recent CT scan demonstrates no evidence of residual/recurrent disease. Dr. Izell will personally review these images when she returns.   Patient notes a dry mouth, but is able to tolerate a wide range of food and is maintaining his weight well. We reviewed ways to help with dry mouth today.   CT of the neck in 8 months with an office visit to follow. Patient was encouraged to call with any questions or concerns in the meantime.   He is following up with his dermatologist approximately every 6-8 weeks.     On date of service, in total, I spent 20 minutes on this encounter. Patient was seen in person. _____________________________________    Leeroy Due, PA-C

## 2023-12-26 NOTE — Progress Notes (Signed)
 Mr. Lyerly returns today to see Leeroy Due PA-C for routine follow-up and to receive results from most recent CT scan.  He completed the treatment on 08/25/2023.   Squamous cell carcinoma of external ear, left   Pain issues, if any: Denies Using a feeding tube?: N/A Weight changes, if any: Denies  Wt Readings from Last 3 Encounters:  12/26/23 - 173 lb 12.8 oz 09/19/23 - 179 lb 10.8 oz 08/01/23 -  189 lb 6.4 oz   Swallowing issues, if any: He states that when he swallows his throat feels sore.He does experience dry mouth. Smoking or chewing tobacco? Denies Using fluoride toothpaste daily? Denies Last ENT visit was on: Last office visit was 07/12/23 with Dr. 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 She had a knee replacement and once she is healed he will look into getting a knee replacement himself.  BP 127/69 (BP Location: Right Arm, Patient Position: Sitting, Cuff Size: Normal)   Pulse (!) 56   Temp 97.8 F (36.6 C)   Resp 18   Ht 6' (1.829 m)   Wt 173 lb 12.8 oz (78.8 kg)   SpO2 99%   BMI 23.57 kg/m

## 2023-12-28 DIAGNOSIS — C44612 Basal cell carcinoma of skin of right upper limb, including shoulder: Secondary | ICD-10-CM | POA: Diagnosis not present

## 2024-01-01 ENCOUNTER — Telehealth: Payer: Self-pay | Admitting: *Deleted

## 2024-01-01 NOTE — Telephone Encounter (Signed)
 CALLED PATIENT TO INFORM OF CT FOR 08-26-24- ARRIVAL TIME- 12:45 PM @ [facility], NO RESTRICTIONS TO SCAN, PATIENT TO RECEIVE RESULTS 09-03-24 @ 2:20 PM FROM DR. SQUIRE, LVM FOR A RETURN CALL

## 2024-01-03 ENCOUNTER — Encounter: Payer: Self-pay | Admitting: *Deleted

## 2024-01-03 ENCOUNTER — Other Ambulatory Visit: Payer: Self-pay | Admitting: *Deleted

## 2024-01-03 NOTE — Progress Notes (Signed)
 Per request of Leeroy Due, PA, I called Dr. Carlie office to make routine follow up appointment for patient.  I called patient with appointment date of 01/10/24 at 2:00 PM.  Patient unable to do that day.  Per Dr. Carlie office assistant's instruction, I gave him their direct phone number to call and reschedule appointment.  [name] was able to repeat back information and agreed to call office to reschedule appointment.  He denied any other needs at this time.

## 2024-01-05 ENCOUNTER — Encounter: Payer: Self-pay | Admitting: *Deleted

## 2024-01-05 NOTE — Progress Notes (Signed)
 Called patient to make sure he was able to reschedule his appointment with Dr. Carlie.  He confirmed that he has an appointment at 1000, 01/11/24.

## 2024-01-08 NOTE — Progress Notes (Signed)
 The proposed treatment discussed in conference is for discussion purpose only and is not a binding recommendation.  The patients have not been physically examined, or presented with their treatment options.  Therefore, final treatment plans cannot be decided.

## 2024-01-11 DIAGNOSIS — C44209 Unspecified malignant neoplasm of skin of left ear and external auricular canal: Secondary | ICD-10-CM | POA: Diagnosis not present

## 2024-02-01 DIAGNOSIS — C4441 Basal cell carcinoma of skin of scalp and neck: Secondary | ICD-10-CM | POA: Diagnosis not present

## 2024-02-01 DIAGNOSIS — C44612 Basal cell carcinoma of skin of right upper limb, including shoulder: Secondary | ICD-10-CM | POA: Diagnosis not present

## 2024-02-16 ENCOUNTER — Ambulatory Visit: Attending: Cardiology | Admitting: Cardiology

## 2024-02-16 ENCOUNTER — Encounter: Payer: Self-pay | Admitting: Cardiology

## 2024-02-16 VITALS — BP 128/60 | HR 51 | Ht 72.0 in | Wt 176.2 lb

## 2024-02-16 DIAGNOSIS — E782 Mixed hyperlipidemia: Secondary | ICD-10-CM | POA: Diagnosis not present

## 2024-02-16 DIAGNOSIS — I25119 Atherosclerotic heart disease of native coronary artery with unspecified angina pectoris: Secondary | ICD-10-CM | POA: Diagnosis not present

## 2024-02-16 DIAGNOSIS — Z01810 Encounter for preprocedural cardiovascular examination: Secondary | ICD-10-CM | POA: Diagnosis not present

## 2024-02-16 NOTE — Progress Notes (Signed)
    Cardiology Office Note  Date: 02/16/2024   ID: Charles Brooks, Charles Brooks May 02, 1951, MRN [id number]  History of Present Illness: Charles Brooks is a 73 y.o. male last seen in January.  He is here for a routine visit.  He does not report any angina or nitroglycerin  use, stable NYHA class II dyspnea.  He has been troubled by progressive right knee pain and anticipates undergoing TKR eventually.  He underwent open repair of left knee quadriceps tear in April of last year and then a left auriculectomy in the setting of ear cancer in November of last year.  No perioperative cardiac complications.  We went over his medications.  No significant change from a cardiac perspective.  Lipid panel from last year looked quite good with LDL 51.  He continues to follow regularly with Dr. Rosamond.  I reviewed his ECG today which shows sinus bradycardia with nonspecific ST changes.  Physical Exam: VS:  BP 128/60   Pulse (!) 51   Ht 6' (1.829 m)   Wt 176 lb 3.2 oz (79.9 kg)   SpO2 99%   BMI 23.90 kg/m , BMI Body mass index is 23.9 kg/m.  Wt Readings from Last 3 Encounters:  02/16/24 176 lb 3.2 oz (79.9 kg)  12/26/23 173 lb 12.8 oz (78.8 kg)  09/19/23 180 lb 2 oz (81.7 kg)    General: Patient appears comfortable at rest. HEENT: Conjunctiva and lids normal. Neck: Supple, no elevated JVP or carotid bruits. Lungs: Clear to auscultation, nonlabored breathing at rest. Cardiac: Regular rate and rhythm, no S3 or significant systolic murmur. Extremities: No pitting edema.  ECG:  An ECG dated 09/08/2022 was personally reviewed today and demonstrated:  Sinus rhythm with nonspecific ST-T changes.  Labwork: June 2024: Cholesterol 109, triglycerides 112, HDL 37, LDL 51 08/30/2023: BUN 32; Hemoglobin 10.1; Platelets 171; Potassium 4.6; Sodium 137 12/19/2023: Creatinine, Ser 1.90   Other Studies Reviewed Today:  No interval cardiac testing for review today.  Assessment and Plan:  1.  CAD status post DES to the  RCA and angioplasty of the RV marginal in 2016.  LVEF approximately 55% at that time.  He has done very well over time and preferred conservative follow-up in terms of ischemic testing.  I reviewed his ECG which is stable.  He does not describe any angina or dyspnea beyond NYHA class II.  Continue aspirin  81 mg daily, Lipitor  40 mg daily, and as needed nitroglycerin .   2.  Mixed hyperlipidemia.  LDL 51 in June 2024.  Continue Lipitor  40 mg daily.  3.  Preoperative cardiac assessment.  Patient anticipates undergoing right TKR, not yet scheduled.  As discussed above, he tolerated 2 operations last year without perioperative complications.  He reports no active angina and has otherwise been stable in terms of ischemic heart disease.  RCRI perioperative cardiac risk index is 1 point, 1.1% chance of major adverse cardiac event.  Unless the situation changes between now and operation, he should not require any further cardiac testing.  Disposition:  Follow up 6 months.  Signed, Jayson JUDITHANN Sierras, M.D., F.A.C.C. [facility]

## 2024-02-16 NOTE — Patient Instructions (Addendum)

## 2024-03-04 DIAGNOSIS — I700 Atherosclerosis of aorta: Secondary | ICD-10-CM | POA: Diagnosis not present

## 2024-03-04 DIAGNOSIS — Z299 Encounter for prophylactic measures, unspecified: Secondary | ICD-10-CM | POA: Diagnosis not present

## 2024-03-04 DIAGNOSIS — M1990 Unspecified osteoarthritis, unspecified site: Secondary | ICD-10-CM | POA: Diagnosis not present

## 2024-03-04 DIAGNOSIS — I10 Essential (primary) hypertension: Secondary | ICD-10-CM | POA: Diagnosis not present

## 2024-03-07 DIAGNOSIS — Z23 Encounter for immunization: Secondary | ICD-10-CM | POA: Diagnosis not present

## 2024-03-14 DIAGNOSIS — C44612 Basal cell carcinoma of skin of right upper limb, including shoulder: Secondary | ICD-10-CM | POA: Diagnosis not present

## 2024-03-14 DIAGNOSIS — Z85828 Personal history of other malignant neoplasm of skin: Secondary | ICD-10-CM | POA: Diagnosis not present

## 2024-03-14 DIAGNOSIS — Z08 Encounter for follow-up examination after completed treatment for malignant neoplasm: Secondary | ICD-10-CM | POA: Diagnosis not present

## 2024-03-19 DIAGNOSIS — R262 Difficulty in walking, not elsewhere classified: Secondary | ICD-10-CM | POA: Diagnosis not present

## 2024-03-19 DIAGNOSIS — M1711 Unilateral primary osteoarthritis, right knee: Secondary | ICD-10-CM | POA: Diagnosis not present

## 2024-03-19 DIAGNOSIS — M25661 Stiffness of right knee, not elsewhere classified: Secondary | ICD-10-CM | POA: Diagnosis not present

## 2024-03-19 DIAGNOSIS — M25561 Pain in right knee: Secondary | ICD-10-CM | POA: Diagnosis not present

## 2024-03-22 DIAGNOSIS — E7800 Pure hypercholesterolemia, unspecified: Secondary | ICD-10-CM | POA: Diagnosis not present

## 2024-03-22 DIAGNOSIS — R52 Pain, unspecified: Secondary | ICD-10-CM | POA: Diagnosis not present

## 2024-03-22 DIAGNOSIS — R5383 Other fatigue: Secondary | ICD-10-CM | POA: Diagnosis not present

## 2024-03-22 DIAGNOSIS — N1832 Chronic kidney disease, stage 3b: Secondary | ICD-10-CM | POA: Diagnosis not present

## 2024-03-22 DIAGNOSIS — Z0000 Encounter for general adult medical examination without abnormal findings: Secondary | ICD-10-CM | POA: Diagnosis not present

## 2024-03-22 DIAGNOSIS — Z299 Encounter for prophylactic measures, unspecified: Secondary | ICD-10-CM | POA: Diagnosis not present

## 2024-03-22 DIAGNOSIS — I10 Essential (primary) hypertension: Secondary | ICD-10-CM | POA: Diagnosis not present

## 2024-03-25 ENCOUNTER — Ambulatory Visit: Admitting: Cardiology

## 2024-03-26 DIAGNOSIS — R262 Difficulty in walking, not elsewhere classified: Secondary | ICD-10-CM | POA: Diagnosis not present

## 2024-03-26 DIAGNOSIS — M25661 Stiffness of right knee, not elsewhere classified: Secondary | ICD-10-CM | POA: Diagnosis not present

## 2024-03-26 DIAGNOSIS — M1711 Unilateral primary osteoarthritis, right knee: Secondary | ICD-10-CM | POA: Diagnosis not present

## 2024-03-26 DIAGNOSIS — M25561 Pain in right knee: Secondary | ICD-10-CM | POA: Diagnosis not present

## 2024-04-02 DIAGNOSIS — R262 Difficulty in walking, not elsewhere classified: Secondary | ICD-10-CM | POA: Diagnosis not present

## 2024-04-02 DIAGNOSIS — M1711 Unilateral primary osteoarthritis, right knee: Secondary | ICD-10-CM | POA: Diagnosis not present

## 2024-04-02 DIAGNOSIS — M25561 Pain in right knee: Secondary | ICD-10-CM | POA: Diagnosis not present

## 2024-04-02 DIAGNOSIS — M25661 Stiffness of right knee, not elsewhere classified: Secondary | ICD-10-CM | POA: Diagnosis not present

## 2024-04-09 DIAGNOSIS — M25661 Stiffness of right knee, not elsewhere classified: Secondary | ICD-10-CM | POA: Diagnosis not present

## 2024-04-09 DIAGNOSIS — M1711 Unilateral primary osteoarthritis, right knee: Secondary | ICD-10-CM | POA: Diagnosis not present

## 2024-04-09 DIAGNOSIS — R262 Difficulty in walking, not elsewhere classified: Secondary | ICD-10-CM | POA: Diagnosis not present

## 2024-04-09 DIAGNOSIS — M25561 Pain in right knee: Secondary | ICD-10-CM | POA: Diagnosis not present

## 2024-04-15 DIAGNOSIS — Z85828 Personal history of other malignant neoplasm of skin: Secondary | ICD-10-CM | POA: Diagnosis not present

## 2024-04-16 DIAGNOSIS — M25661 Stiffness of right knee, not elsewhere classified: Secondary | ICD-10-CM | POA: Diagnosis not present

## 2024-04-16 DIAGNOSIS — M25561 Pain in right knee: Secondary | ICD-10-CM | POA: Diagnosis not present

## 2024-04-16 DIAGNOSIS — R262 Difficulty in walking, not elsewhere classified: Secondary | ICD-10-CM | POA: Diagnosis not present

## 2024-04-16 DIAGNOSIS — M1711 Unilateral primary osteoarthritis, right knee: Secondary | ICD-10-CM | POA: Diagnosis not present

## 2024-04-25 DIAGNOSIS — C44612 Basal cell carcinoma of skin of right upper limb, including shoulder: Secondary | ICD-10-CM | POA: Diagnosis not present

## 2024-08-26 ENCOUNTER — Other Ambulatory Visit (HOSPITAL_COMMUNITY)

## 2024-09-03 ENCOUNTER — Ambulatory Visit: Payer: Self-pay | Admitting: Radiation Oncology

## 2024-09-03 ENCOUNTER — Ambulatory Visit: Admitting: Radiation Oncology

## 2024-09-03 ENCOUNTER — Ambulatory Visit: Admitting: Radiology
